# Patient Record
Sex: Male | Born: 1950 | Race: White | Hispanic: No | Marital: Single | Smoking: Current every day smoker
Health system: Southern US, Community
[De-identification: ages and names within clinical notes are randomized; demographics above are authoritative.]

## PROBLEM LIST (undated history)

## (undated) DIAGNOSIS — K219 Gastro-esophageal reflux disease without esophagitis: Secondary | ICD-10-CM

## (undated) DIAGNOSIS — E785 Hyperlipidemia, unspecified: Secondary | ICD-10-CM

## (undated) DIAGNOSIS — M1990 Unspecified osteoarthritis, unspecified site: Secondary | ICD-10-CM

## (undated) DIAGNOSIS — I10 Essential (primary) hypertension: Secondary | ICD-10-CM

## (undated) DIAGNOSIS — J449 Chronic obstructive pulmonary disease, unspecified: Secondary | ICD-10-CM

## (undated) HISTORY — DX: Chronic obstructive pulmonary disease, unspecified: J44.9

## (undated) HISTORY — DX: Hyperlipidemia, unspecified: E78.5

## (undated) HISTORY — DX: Unspecified osteoarthritis, unspecified site: M19.90

## (undated) HISTORY — DX: Gastro-esophageal reflux disease without esophagitis: K21.9

## (undated) HISTORY — DX: Essential (primary) hypertension: I10

---

## 1974-02-10 HISTORY — PX: KNEE SURGERY: SHX244

## 1998-07-30 ENCOUNTER — Encounter: Payer: Self-pay | Admitting: *Deleted

## 1998-07-30 ENCOUNTER — Inpatient Hospital Stay (HOSPITAL_COMMUNITY): Admission: AD | Admit: 1998-07-30 | Discharge: 1998-08-02 | Payer: Self-pay | Admitting: *Deleted

## 2003-06-19 ENCOUNTER — Emergency Department (HOSPITAL_COMMUNITY): Admission: EM | Admit: 2003-06-19 | Discharge: 2003-06-19 | Payer: Self-pay | Admitting: Emergency Medicine

## 2004-06-03 ENCOUNTER — Ambulatory Visit: Payer: Self-pay | Admitting: Internal Medicine

## 2004-06-06 ENCOUNTER — Ambulatory Visit (HOSPITAL_COMMUNITY): Admission: RE | Admit: 2004-06-06 | Discharge: 2004-06-06 | Payer: Self-pay | Admitting: Neurosurgery

## 2004-09-20 ENCOUNTER — Encounter: Admission: RE | Admit: 2004-09-20 | Discharge: 2004-09-20 | Payer: Self-pay | Admitting: Neurosurgery

## 2005-09-23 ENCOUNTER — Emergency Department: Payer: Self-pay | Admitting: Emergency Medicine

## 2006-05-15 IMAGING — CT CT CERVICAL SPINE W/O CM
3 of 13 series · 10 of 33 positions shown, 12 images · non-contrast
Comparison: None.

CLINICAL DATA: Neck and left arm pain.

CERVICAL MYELOGRAM
TECHNIQUE: Multidetector CT imaging of the cervical spine was performed
following the cervical myelogram.  Sagittal and coronal plane reformatted images
were reconstructed from the axial CT data, as well as axial reconstructed images
through the disc spaces, at the C2-C3 through through T2-T3 levels. These were
also reviewed.

[Series 102: cervical spine · axial · 0.23mm/px · z∈[-209,-148]mm · 2 of 593 slices shown, 3 images]
[im 198/593  soft-tissue]
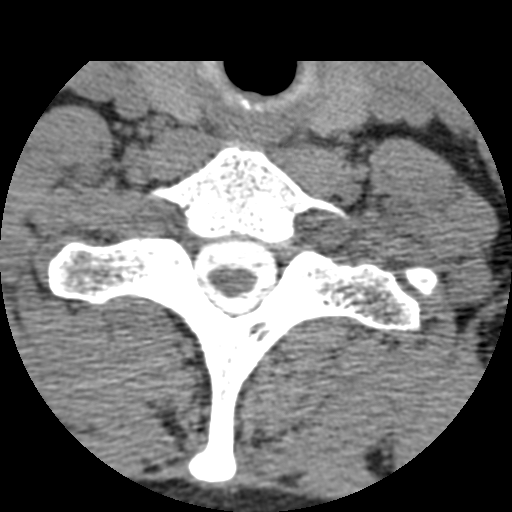
[im 198/593  bone]
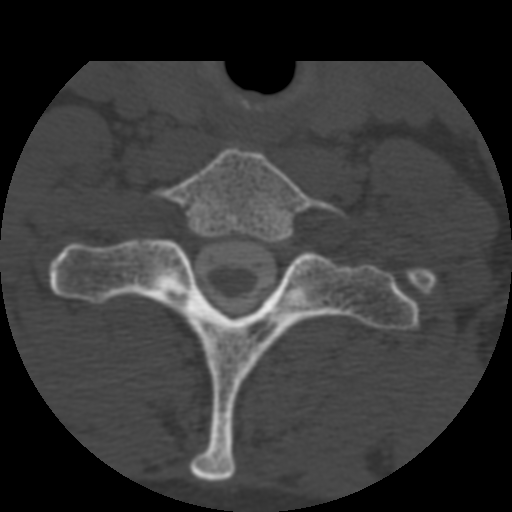
[im 395/593  bone]
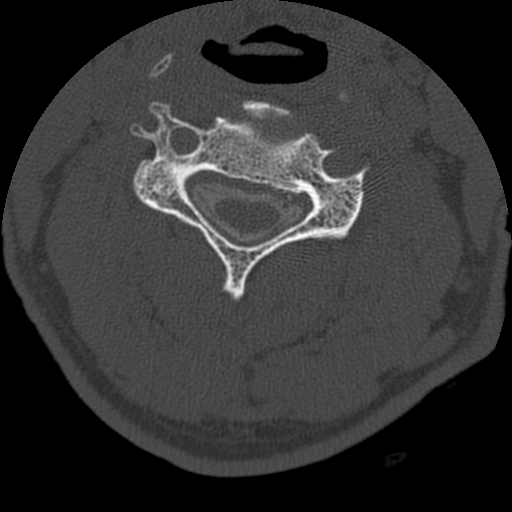

[Series 103: reformatted · sagittal · 0.37mm/px · 5 of 47 slices shown, 6 images (1 of 2)]
[im 16/47  bone]
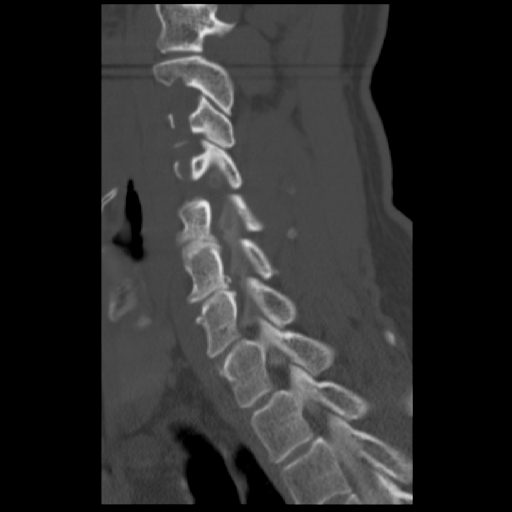
[im 20/47  bone]
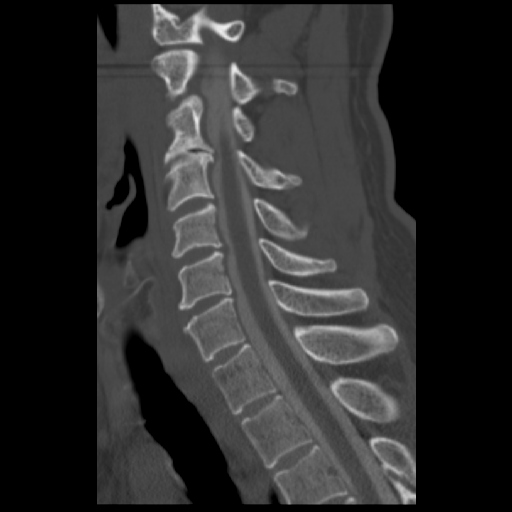
[im 24/47  soft-tissue]
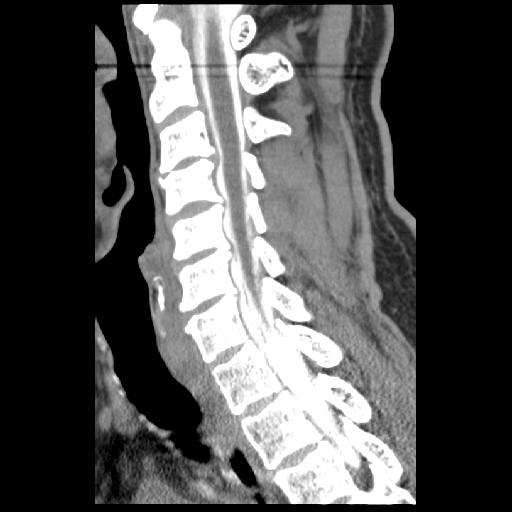
[im 24/47  bone]
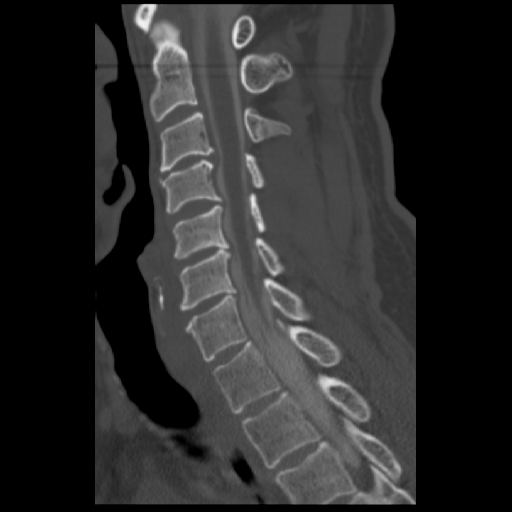
[im 27/47  bone]
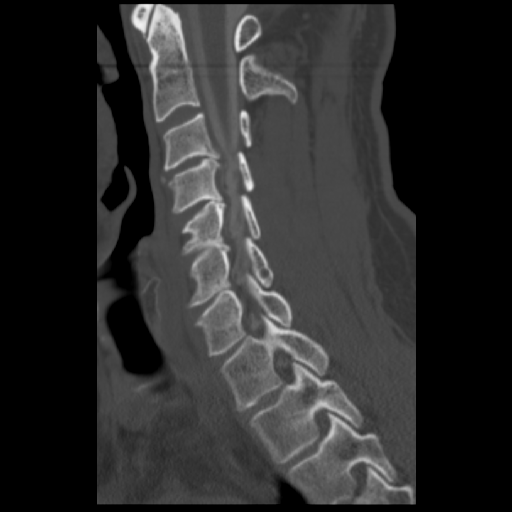
[im 31/47  bone]
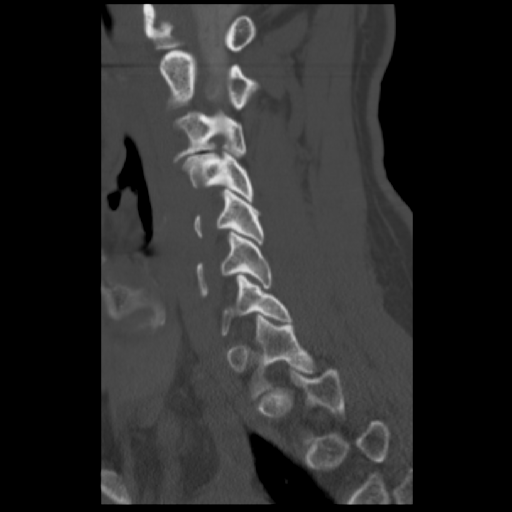

[Series 104: reformatted · coronal · 0.37mm/px · 3 of 40 slices shown (2 of 2)]
[im 8/40  bone]
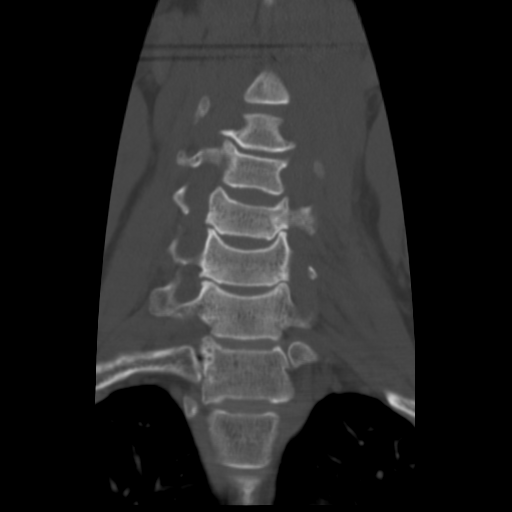
[im 16/40  bone]
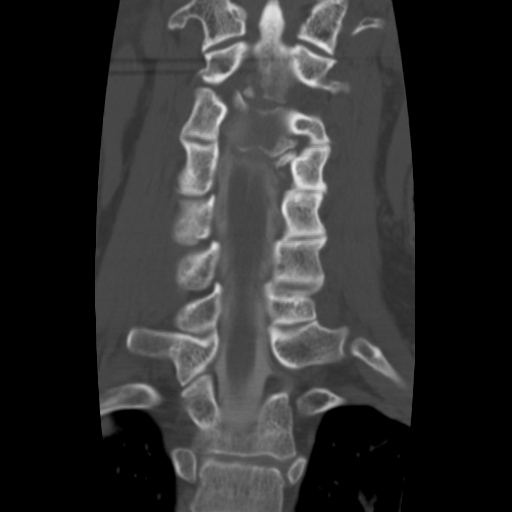
[im 24/40  bone]
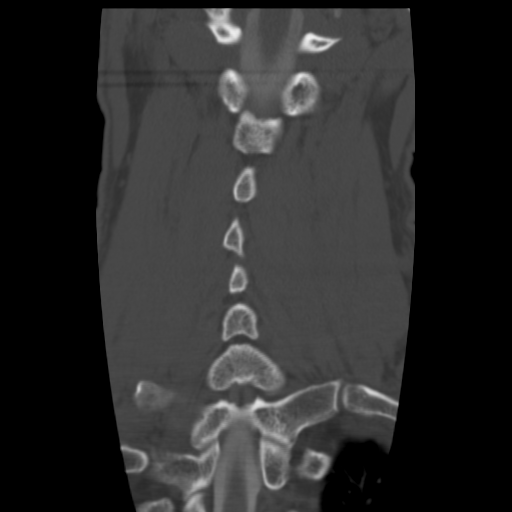

[10 of 33 positions shown; findings below may reference images not displayed]

FINDINGS: 10 cc of 2mnipaque-B88 was introduced into the subarachnoid space at
the L4-L5 level by Dr. Toka-Qeti. The contrast was then brought by gravity into
the cervical region and spot images of the cervical spine were obtained in
multiple projections. These demonstrate some decreased filling of the nerve root
sheath on the right at the C6-C7 level. The nerve root sheaths fill normally on
the left at each level. Posterior spurs are noted at the C3-C4 through C6-C7
levels, with associated anterior indentations on the thecal sac. No cord
abnormalities are visible.

IMPRESSION

1. Some decreased filling of the right C6-C7 nerve root sheath.

2. Normal filling of the nerve root sheaths on the left at each level. 

3. Posterior spondylosis at the C3-C4 through C6-C7 levels. This is most
pronounced at the C3-C4 level.

CERVICAL SPINE CT WITH CONTRAST
FINDINGS: Mild dextroconvex cervical scoliosis. This is most likely positional
since it was not seen on the myelogram. Normal appearing spinal cord.

C2-C3: Right uncinate spur formation without significant foraminal stenosis.

C3-C4: Moderate diffuse disc bulging and associated spur formation with more
pronounced spur formation in the uncinate region on the left. These changes are
producing mild canal stenosis, mild to moderate neural foraminal stenosis on the
right and moderate neural foraminal stenosis on the left. 

C4-C5: Mild-to-moderate diffuse disc bulging and associated spur formation with
minimal foraminal stenosis on the left.

C5-C6: Mild to moderate diffuse disc bulging and associated spur formation with
minimal foraminal stenosis on the right.

C6-C7: Mild diffuse disc bulging and associated spur formation with slightly
more pronounced spur formation in the uncinate regions bilaterally. This is
causing mild to moderate neural foraminal stenosis on the right.

C7-T1: Minimal posterior spur formation without significant canal or foraminal
stenosis. 

T1-T2: No significant abnormality.

T2-T3: No significant abnormality.

No focal disc herniations are seen.

IMPRESSION

Degenerative changes, as described above. No focal disc herniations are seen and
no neural compression is identified at any level.

CT MULTIPLANAR RECONSTRUCTION OF THE CERVICAL SPINE

Multiplanar reformatted CT images were reconstructed from the axial CT data. 
These images were reviewed, and pertinent findings are included in the complete
cervical spine CT report above. spine with

## 2006-05-15 IMAGING — RF DG MYELOGRAM CERVICAL
11 series · 11 of 11 positions shown · non-contrast
Comparison: None.

CLINICAL DATA: Neck and left arm pain.

CERVICAL MYELOGRAM
TECHNIQUE: Multidetector CT imaging of the cervical spine was performed
following the cervical myelogram.  Sagittal and coronal plane reformatted images
were reconstructed from the axial CT data, as well as axial reconstructed images
through the disc spaces, at the C2-C3 through through T2-T3 levels. These were
also reviewed.

[Series 1: run · 1 of 1 slices shown (1 of 11)]
[im 1/1]
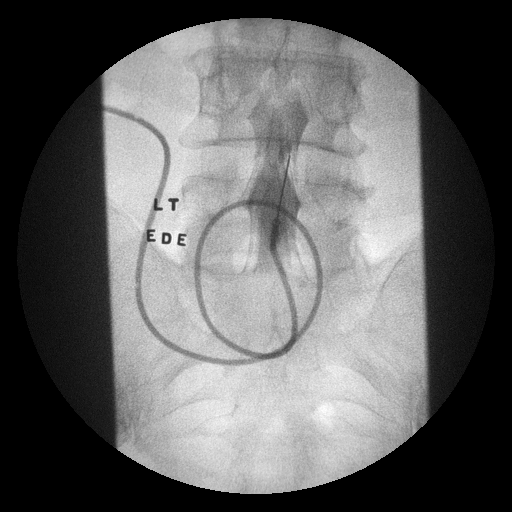

[Series 2: run · 1 of 1 slices shown (2 of 11)]
[im 1/1]
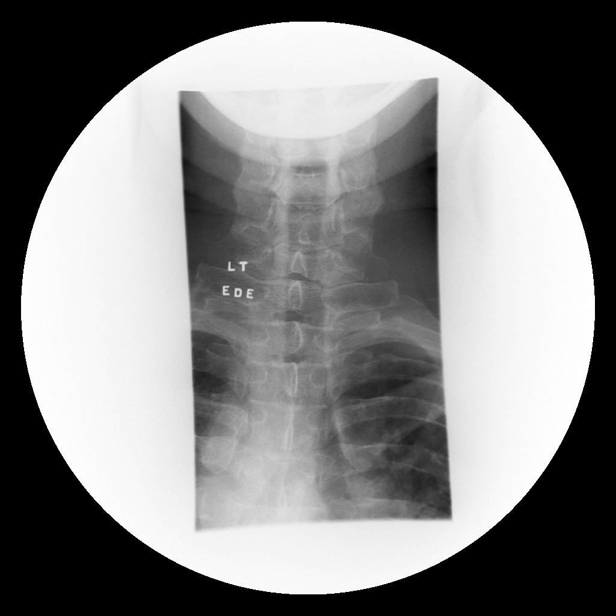

[Series 3: run · 1 of 1 slices shown (3 of 11)]
[im 1/1]
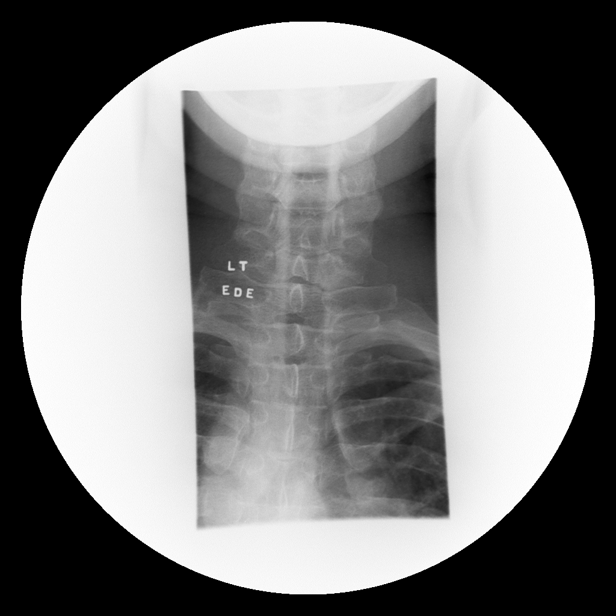

[Series 4: run · 1 of 1 slices shown (4 of 11)]
[im 1/1]
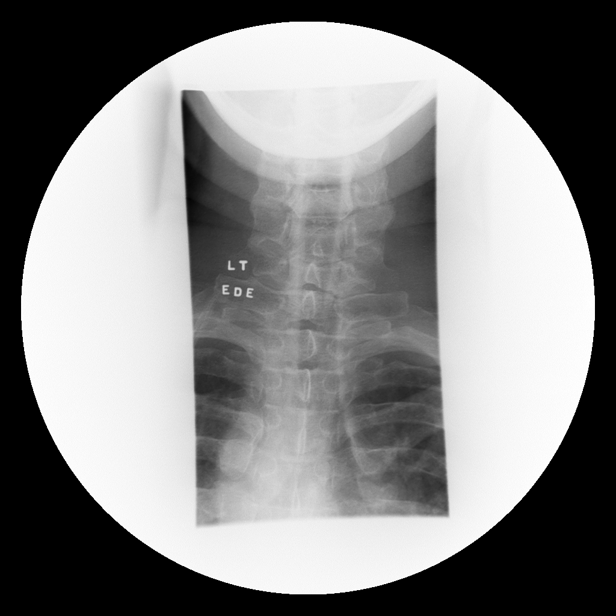

[Series 5: run · 1 of 1 slices shown (5 of 11)]
[im 1/1]
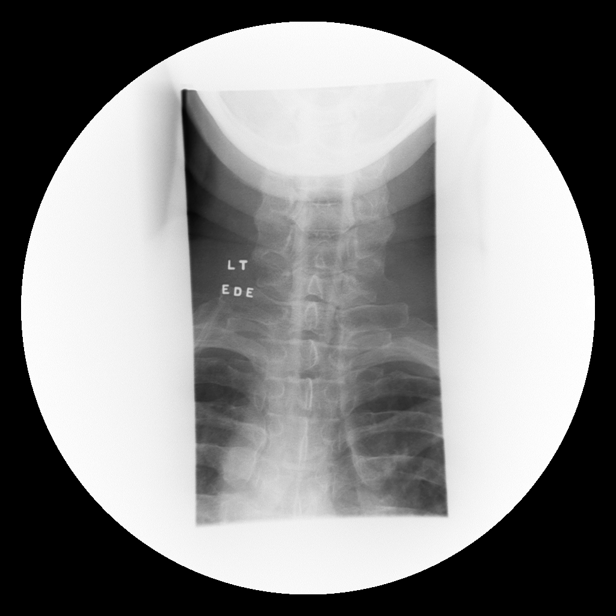

[Series 6: run · 1 of 1 slices shown (6 of 11)]
[im 1/1]
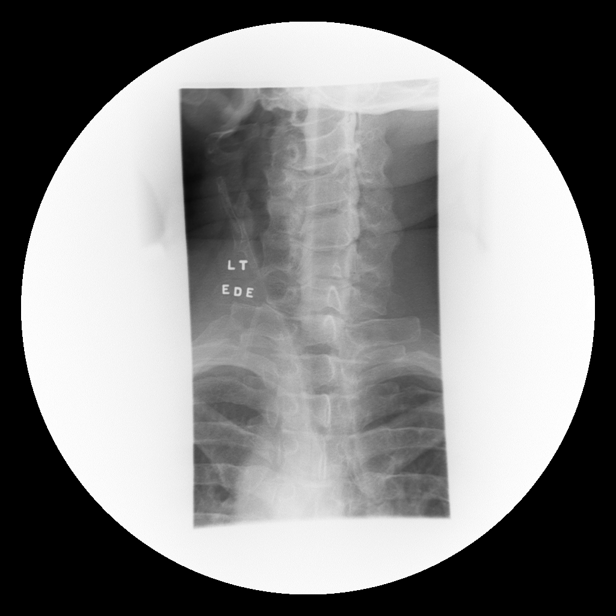

[Series 7: run · 1 of 1 slices shown (7 of 11)]
[im 1/1]
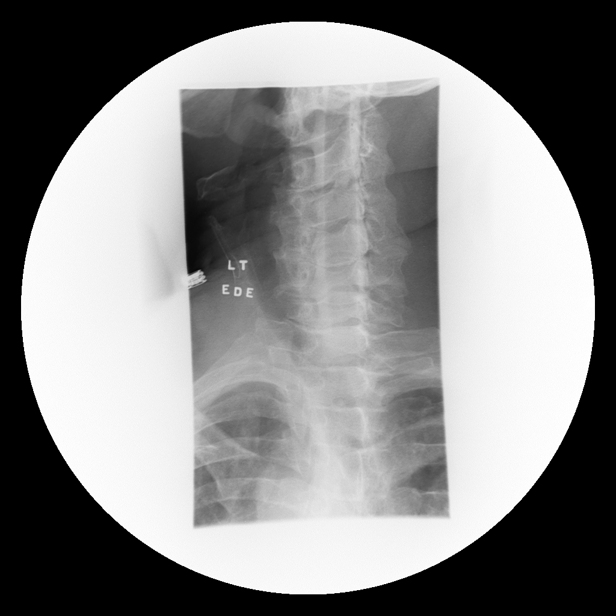

[Series 8: run · 1 of 1 slices shown (8 of 11)]
[im 1/1]
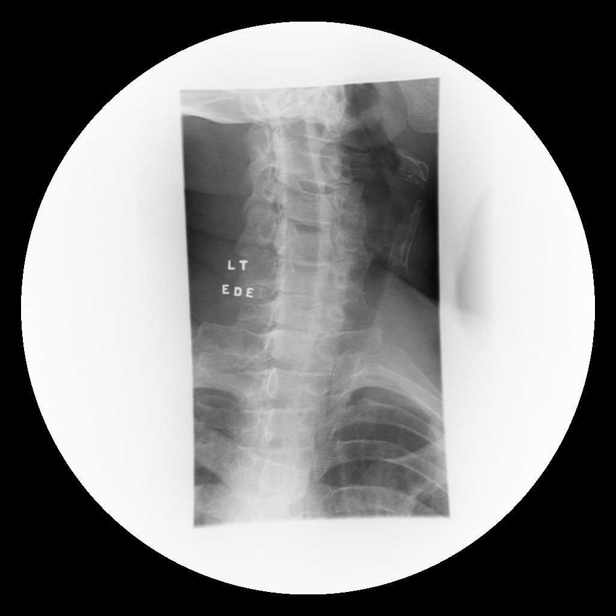

[Series 9: run · 1 of 1 slices shown (9 of 11)]
[im 1/1]
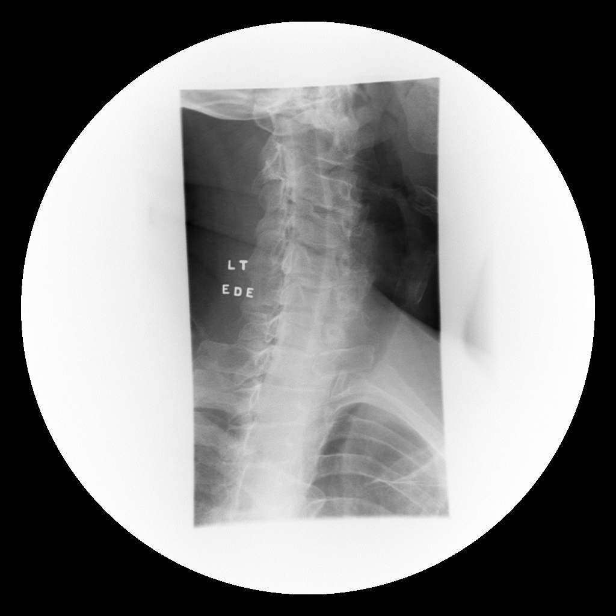

[Series 10: run · 1 of 1 slices shown (10 of 11)]
[im 1/1]
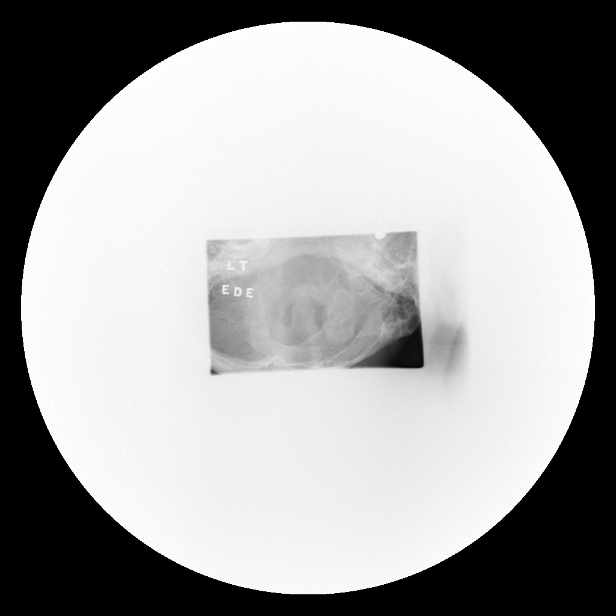

[Series 11: run · 1 of 1 slices shown (11 of 11)]
[im 1/1]
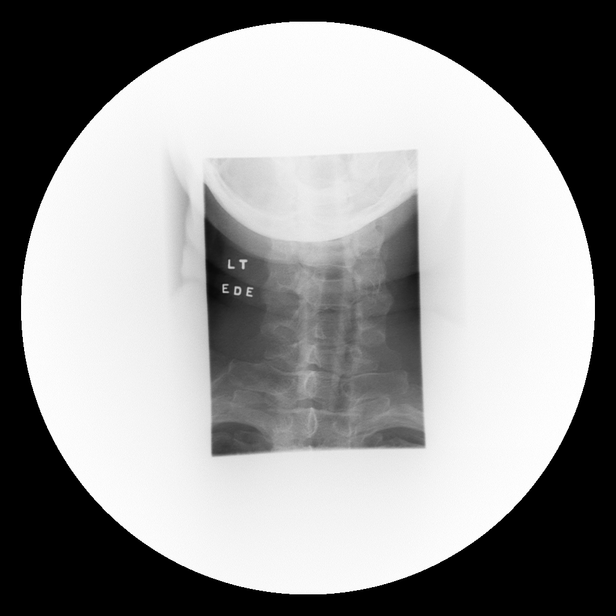

[11 of 11 positions shown; findings below may reference images not displayed]

FINDINGS: 10 cc of 2mnipaque-B88 was introduced into the subarachnoid space at
the L4-L5 level by Dr. Toka-Qeti. The contrast was then brought by gravity into
the cervical region and spot images of the cervical spine were obtained in
multiple projections. These demonstrate some decreased filling of the nerve root
sheath on the right at the C6-C7 level. The nerve root sheaths fill normally on
the left at each level. Posterior spurs are noted at the C3-C4 through C6-C7
levels, with associated anterior indentations on the thecal sac. No cord
abnormalities are visible.

IMPRESSION

1. Some decreased filling of the right C6-C7 nerve root sheath.

2. Normal filling of the nerve root sheaths on the left at each level. 

3. Posterior spondylosis at the C3-C4 through C6-C7 levels. This is most
pronounced at the C3-C4 level.

CERVICAL SPINE CT WITH CONTRAST
FINDINGS: Mild dextroconvex cervical scoliosis. This is most likely positional
since it was not seen on the myelogram. Normal appearing spinal cord.

C2-C3: Right uncinate spur formation without significant foraminal stenosis.

C3-C4: Moderate diffuse disc bulging and associated spur formation with more
pronounced spur formation in the uncinate region on the left. These changes are
producing mild canal stenosis, mild to moderate neural foraminal stenosis on the
right and moderate neural foraminal stenosis on the left. 

C4-C5: Mild-to-moderate diffuse disc bulging and associated spur formation with
minimal foraminal stenosis on the left.

C5-C6: Mild to moderate diffuse disc bulging and associated spur formation with
minimal foraminal stenosis on the right.

C6-C7: Mild diffuse disc bulging and associated spur formation with slightly
more pronounced spur formation in the uncinate regions bilaterally. This is
causing mild to moderate neural foraminal stenosis on the right.

C7-T1: Minimal posterior spur formation without significant canal or foraminal
stenosis. 

T1-T2: No significant abnormality.

T2-T3: No significant abnormality.

No focal disc herniations are seen.

IMPRESSION

Degenerative changes, as described above. No focal disc herniations are seen and
no neural compression is identified at any level.

CT MULTIPLANAR RECONSTRUCTION OF THE CERVICAL SPINE

Multiplanar reformatted CT images were reconstructed from the axial CT data. 
These images were reviewed, and pertinent findings are included in the complete
cervical spine CT report above. spine with

## 2006-08-17 ENCOUNTER — Emergency Department (HOSPITAL_COMMUNITY): Admission: EM | Admit: 2006-08-17 | Discharge: 2006-08-17 | Payer: Self-pay | Admitting: Emergency Medicine

## 2006-09-20 ENCOUNTER — Emergency Department (HOSPITAL_COMMUNITY): Admission: EM | Admit: 2006-09-20 | Discharge: 2006-09-20 | Payer: Self-pay | Admitting: Emergency Medicine

## 2010-05-16 ENCOUNTER — Ambulatory Visit: Payer: BC Managed Care – PPO | Attending: Unknown Physician Specialty

## 2010-05-16 DIAGNOSIS — M545 Low back pain, unspecified: Secondary | ICD-10-CM | POA: Insufficient documentation

## 2010-05-16 DIAGNOSIS — M6281 Muscle weakness (generalized): Secondary | ICD-10-CM | POA: Insufficient documentation

## 2010-05-16 DIAGNOSIS — R5381 Other malaise: Secondary | ICD-10-CM | POA: Insufficient documentation

## 2010-05-16 DIAGNOSIS — M542 Cervicalgia: Secondary | ICD-10-CM | POA: Insufficient documentation

## 2010-05-16 DIAGNOSIS — IMO0001 Reserved for inherently not codable concepts without codable children: Secondary | ICD-10-CM | POA: Insufficient documentation

## 2010-05-20 ENCOUNTER — Ambulatory Visit: Payer: BC Managed Care – PPO | Admitting: Physical Therapy

## 2010-05-22 ENCOUNTER — Ambulatory Visit: Payer: BC Managed Care – PPO | Admitting: Physical Therapy

## 2010-05-27 ENCOUNTER — Ambulatory Visit: Payer: BC Managed Care – PPO | Admitting: Physical Therapy

## 2010-05-28 ENCOUNTER — Ambulatory Visit: Payer: BC Managed Care – PPO

## 2010-06-05 ENCOUNTER — Ambulatory Visit: Payer: BC Managed Care – PPO | Admitting: Physical Therapy

## 2010-06-06 ENCOUNTER — Ambulatory Visit: Payer: BC Managed Care – PPO

## 2010-06-10 ENCOUNTER — Ambulatory Visit: Payer: BC Managed Care – PPO

## 2010-06-12 ENCOUNTER — Ambulatory Visit: Payer: BC Managed Care – PPO | Attending: Unknown Physician Specialty | Admitting: Physical Therapy

## 2010-06-12 DIAGNOSIS — IMO0001 Reserved for inherently not codable concepts without codable children: Secondary | ICD-10-CM | POA: Insufficient documentation

## 2010-06-12 DIAGNOSIS — M542 Cervicalgia: Secondary | ICD-10-CM | POA: Insufficient documentation

## 2010-06-12 DIAGNOSIS — M545 Low back pain, unspecified: Secondary | ICD-10-CM | POA: Insufficient documentation

## 2010-06-12 DIAGNOSIS — R5381 Other malaise: Secondary | ICD-10-CM | POA: Insufficient documentation

## 2010-06-12 DIAGNOSIS — M6281 Muscle weakness (generalized): Secondary | ICD-10-CM | POA: Insufficient documentation

## 2010-06-17 ENCOUNTER — Ambulatory Visit: Payer: BC Managed Care – PPO

## 2010-06-20 ENCOUNTER — Ambulatory Visit: Payer: BC Managed Care – PPO

## 2010-06-25 ENCOUNTER — Ambulatory Visit: Payer: BC Managed Care – PPO

## 2010-06-27 ENCOUNTER — Ambulatory Visit: Payer: BC Managed Care – PPO

## 2012-01-27 DIAGNOSIS — M5030 Other cervical disc degeneration, unspecified cervical region: Secondary | ICD-10-CM | POA: Insufficient documentation

## 2012-01-27 DIAGNOSIS — J439 Emphysema, unspecified: Secondary | ICD-10-CM | POA: Insufficient documentation

## 2012-02-20 DIAGNOSIS — Z79891 Long term (current) use of opiate analgesic: Secondary | ICD-10-CM | POA: Insufficient documentation

## 2012-02-20 DIAGNOSIS — G894 Chronic pain syndrome: Secondary | ICD-10-CM | POA: Insufficient documentation

## 2012-02-20 DIAGNOSIS — F17200 Nicotine dependence, unspecified, uncomplicated: Secondary | ICD-10-CM | POA: Insufficient documentation

## 2012-06-29 ENCOUNTER — Other Ambulatory Visit: Payer: Self-pay | Admitting: Orthopedic Surgery

## 2012-06-29 DIAGNOSIS — M25512 Pain in left shoulder: Secondary | ICD-10-CM

## 2012-07-01 ENCOUNTER — Ambulatory Visit
Admission: RE | Admit: 2012-07-01 | Discharge: 2012-07-01 | Disposition: A | Discharge status: Home / Self Care | Payer: BC Managed Care – PPO | Source: Ambulatory Visit | Attending: Orthopedic Surgery | Admitting: Orthopedic Surgery

## 2012-07-01 DIAGNOSIS — M25512 Pain in left shoulder: Secondary | ICD-10-CM

## 2013-02-10 HISTORY — PX: CERVICAL DISC SURGERY: SHX588

## 2015-05-29 DIAGNOSIS — E669 Obesity, unspecified: Secondary | ICD-10-CM | POA: Diagnosis not present

## 2015-05-29 DIAGNOSIS — I10 Essential (primary) hypertension: Secondary | ICD-10-CM | POA: Diagnosis not present

## 2015-05-29 DIAGNOSIS — J449 Chronic obstructive pulmonary disease, unspecified: Secondary | ICD-10-CM | POA: Diagnosis not present

## 2015-05-29 DIAGNOSIS — Z79899 Other long term (current) drug therapy: Secondary | ICD-10-CM | POA: Diagnosis not present

## 2015-05-29 DIAGNOSIS — E559 Vitamin D deficiency, unspecified: Secondary | ICD-10-CM | POA: Diagnosis not present

## 2015-05-29 DIAGNOSIS — F419 Anxiety disorder, unspecified: Secondary | ICD-10-CM | POA: Diagnosis not present

## 2015-05-29 DIAGNOSIS — Z716 Tobacco abuse counseling: Secondary | ICD-10-CM | POA: Diagnosis not present

## 2015-05-29 DIAGNOSIS — R7989 Other specified abnormal findings of blood chemistry: Secondary | ICD-10-CM | POA: Diagnosis not present

## 2015-05-29 DIAGNOSIS — E785 Hyperlipidemia, unspecified: Secondary | ICD-10-CM | POA: Diagnosis not present

## 2015-05-29 DIAGNOSIS — E7800 Pure hypercholesterolemia, unspecified: Secondary | ICD-10-CM | POA: Diagnosis not present

## 2015-05-30 DIAGNOSIS — Z79891 Long term (current) use of opiate analgesic: Secondary | ICD-10-CM | POA: Diagnosis not present

## 2015-07-26 DIAGNOSIS — I10 Essential (primary) hypertension: Secondary | ICD-10-CM | POA: Diagnosis not present

## 2015-07-26 DIAGNOSIS — E782 Mixed hyperlipidemia: Secondary | ICD-10-CM | POA: Diagnosis not present

## 2015-07-26 DIAGNOSIS — J410 Simple chronic bronchitis: Secondary | ICD-10-CM | POA: Diagnosis not present

## 2015-07-26 DIAGNOSIS — S70262A Insect bite (nonvenomous), left hip, initial encounter: Secondary | ICD-10-CM | POA: Diagnosis not present

## 2015-07-26 DIAGNOSIS — R202 Paresthesia of skin: Secondary | ICD-10-CM | POA: Diagnosis not present

## 2015-07-26 DIAGNOSIS — R5383 Other fatigue: Secondary | ICD-10-CM | POA: Diagnosis not present

## 2015-08-27 DIAGNOSIS — M25512 Pain in left shoulder: Secondary | ICD-10-CM | POA: Diagnosis not present

## 2015-08-27 DIAGNOSIS — E782 Mixed hyperlipidemia: Secondary | ICD-10-CM | POA: Diagnosis not present

## 2015-08-27 DIAGNOSIS — F528 Other sexual dysfunction not due to a substance or known physiological condition: Secondary | ICD-10-CM | POA: Diagnosis not present

## 2015-09-05 DIAGNOSIS — M25512 Pain in left shoulder: Secondary | ICD-10-CM | POA: Diagnosis not present

## 2015-09-12 DIAGNOSIS — M25512 Pain in left shoulder: Secondary | ICD-10-CM | POA: Diagnosis not present

## 2015-10-29 DIAGNOSIS — Z6824 Body mass index (BMI) 24.0-24.9, adult: Secondary | ICD-10-CM | POA: Diagnosis not present

## 2015-10-29 DIAGNOSIS — I10 Essential (primary) hypertension: Secondary | ICD-10-CM | POA: Diagnosis not present

## 2015-10-29 DIAGNOSIS — Z125 Encounter for screening for malignant neoplasm of prostate: Secondary | ICD-10-CM | POA: Diagnosis not present

## 2015-10-29 DIAGNOSIS — Z0000 Encounter for general adult medical examination without abnormal findings: Secondary | ICD-10-CM | POA: Diagnosis not present

## 2015-11-28 DIAGNOSIS — E782 Mixed hyperlipidemia: Secondary | ICD-10-CM | POA: Diagnosis not present

## 2015-11-28 DIAGNOSIS — I10 Essential (primary) hypertension: Secondary | ICD-10-CM | POA: Diagnosis not present

## 2016-03-06 DIAGNOSIS — I10 Essential (primary) hypertension: Secondary | ICD-10-CM | POA: Diagnosis not present

## 2016-03-06 DIAGNOSIS — E782 Mixed hyperlipidemia: Secondary | ICD-10-CM | POA: Diagnosis not present

## 2016-03-21 DIAGNOSIS — H6123 Impacted cerumen, bilateral: Secondary | ICD-10-CM | POA: Diagnosis not present

## 2016-03-21 DIAGNOSIS — H60313 Diffuse otitis externa, bilateral: Secondary | ICD-10-CM | POA: Diagnosis not present

## 2016-03-21 DIAGNOSIS — J018 Other acute sinusitis: Secondary | ICD-10-CM | POA: Diagnosis not present

## 2016-04-16 DIAGNOSIS — H6123 Impacted cerumen, bilateral: Secondary | ICD-10-CM | POA: Diagnosis not present

## 2016-04-16 DIAGNOSIS — T162XXA Foreign body in left ear, initial encounter: Secondary | ICD-10-CM | POA: Diagnosis not present

## 2016-04-16 DIAGNOSIS — H9312 Tinnitus, left ear: Secondary | ICD-10-CM | POA: Diagnosis not present

## 2016-06-09 DIAGNOSIS — Z23 Encounter for immunization: Secondary | ICD-10-CM | POA: Diagnosis not present

## 2016-06-09 DIAGNOSIS — I10 Essential (primary) hypertension: Secondary | ICD-10-CM | POA: Diagnosis not present

## 2016-06-09 DIAGNOSIS — E782 Mixed hyperlipidemia: Secondary | ICD-10-CM | POA: Diagnosis not present

## 2016-06-09 DIAGNOSIS — R7301 Impaired fasting glucose: Secondary | ICD-10-CM | POA: Diagnosis not present

## 2016-06-09 DIAGNOSIS — Z1159 Encounter for screening for other viral diseases: Secondary | ICD-10-CM | POA: Diagnosis not present

## 2016-07-21 DIAGNOSIS — Z1211 Encounter for screening for malignant neoplasm of colon: Secondary | ICD-10-CM | POA: Diagnosis not present

## 2016-09-09 DIAGNOSIS — E782 Mixed hyperlipidemia: Secondary | ICD-10-CM | POA: Diagnosis not present

## 2016-09-09 DIAGNOSIS — I10 Essential (primary) hypertension: Secondary | ICD-10-CM | POA: Diagnosis not present

## 2016-09-09 DIAGNOSIS — R7301 Impaired fasting glucose: Secondary | ICD-10-CM | POA: Diagnosis not present

## 2016-11-18 DIAGNOSIS — F410 Panic disorder [episodic paroxysmal anxiety] without agoraphobia: Secondary | ICD-10-CM | POA: Insufficient documentation

## 2016-12-12 DIAGNOSIS — R7302 Impaired glucose tolerance (oral): Secondary | ICD-10-CM | POA: Diagnosis not present

## 2016-12-12 DIAGNOSIS — I10 Essential (primary) hypertension: Secondary | ICD-10-CM | POA: Diagnosis not present

## 2016-12-12 DIAGNOSIS — E782 Mixed hyperlipidemia: Secondary | ICD-10-CM | POA: Diagnosis not present

## 2016-12-30 DIAGNOSIS — Z0000 Encounter for general adult medical examination without abnormal findings: Secondary | ICD-10-CM | POA: Diagnosis not present

## 2016-12-30 DIAGNOSIS — K5903 Drug induced constipation: Secondary | ICD-10-CM | POA: Diagnosis not present

## 2017-03-19 DIAGNOSIS — E782 Mixed hyperlipidemia: Secondary | ICD-10-CM | POA: Diagnosis not present

## 2017-03-19 DIAGNOSIS — R7302 Impaired glucose tolerance (oral): Secondary | ICD-10-CM | POA: Diagnosis not present

## 2017-03-19 DIAGNOSIS — I10 Essential (primary) hypertension: Secondary | ICD-10-CM | POA: Diagnosis not present

## 2017-03-30 DIAGNOSIS — R1319 Other dysphagia: Secondary | ICD-10-CM | POA: Diagnosis not present

## 2017-03-30 DIAGNOSIS — R799 Abnormal finding of blood chemistry, unspecified: Secondary | ICD-10-CM | POA: Diagnosis not present

## 2017-04-01 DIAGNOSIS — J439 Emphysema, unspecified: Secondary | ICD-10-CM | POA: Diagnosis not present

## 2017-04-01 DIAGNOSIS — Z122 Encounter for screening for malignant neoplasm of respiratory organs: Secondary | ICD-10-CM | POA: Diagnosis not present

## 2017-04-01 DIAGNOSIS — Z87891 Personal history of nicotine dependence: Secondary | ICD-10-CM | POA: Diagnosis not present

## 2017-04-01 DIAGNOSIS — I700 Atherosclerosis of aorta: Secondary | ICD-10-CM | POA: Diagnosis not present

## 2017-04-01 DIAGNOSIS — I2510 Atherosclerotic heart disease of native coronary artery without angina pectoris: Secondary | ICD-10-CM | POA: Diagnosis not present

## 2017-06-18 DIAGNOSIS — H52223 Regular astigmatism, bilateral: Secondary | ICD-10-CM | POA: Diagnosis not present

## 2017-06-18 DIAGNOSIS — I10 Essential (primary) hypertension: Secondary | ICD-10-CM | POA: Diagnosis not present

## 2017-06-18 DIAGNOSIS — H2513 Age-related nuclear cataract, bilateral: Secondary | ICD-10-CM | POA: Diagnosis not present

## 2017-06-18 DIAGNOSIS — E782 Mixed hyperlipidemia: Secondary | ICD-10-CM | POA: Diagnosis not present

## 2017-06-18 DIAGNOSIS — J410 Simple chronic bronchitis: Secondary | ICD-10-CM | POA: Diagnosis not present

## 2017-06-18 DIAGNOSIS — H5203 Hypermetropia, bilateral: Secondary | ICD-10-CM | POA: Diagnosis not present

## 2017-06-18 DIAGNOSIS — H524 Presbyopia: Secondary | ICD-10-CM | POA: Diagnosis not present

## 2017-06-18 DIAGNOSIS — R7302 Impaired glucose tolerance (oral): Secondary | ICD-10-CM | POA: Diagnosis not present

## 2017-09-29 DIAGNOSIS — E782 Mixed hyperlipidemia: Secondary | ICD-10-CM | POA: Diagnosis not present

## 2017-09-29 DIAGNOSIS — R7302 Impaired glucose tolerance (oral): Secondary | ICD-10-CM | POA: Diagnosis not present

## 2017-09-29 DIAGNOSIS — M5137 Other intervertebral disc degeneration, lumbosacral region: Secondary | ICD-10-CM | POA: Diagnosis not present

## 2017-09-29 DIAGNOSIS — I10 Essential (primary) hypertension: Secondary | ICD-10-CM | POA: Diagnosis not present

## 2017-09-29 DIAGNOSIS — J410 Simple chronic bronchitis: Secondary | ICD-10-CM | POA: Diagnosis not present

## 2017-09-29 DIAGNOSIS — M5116 Intervertebral disc disorders with radiculopathy, lumbar region: Secondary | ICD-10-CM | POA: Diagnosis not present

## 2017-09-29 DIAGNOSIS — M545 Low back pain: Secondary | ICD-10-CM | POA: Diagnosis not present

## 2017-12-31 DIAGNOSIS — I10 Essential (primary) hypertension: Secondary | ICD-10-CM | POA: Diagnosis not present

## 2017-12-31 DIAGNOSIS — R7303 Prediabetes: Secondary | ICD-10-CM | POA: Diagnosis not present

## 2017-12-31 DIAGNOSIS — K219 Gastro-esophageal reflux disease without esophagitis: Secondary | ICD-10-CM | POA: Diagnosis not present

## 2017-12-31 DIAGNOSIS — Z0000 Encounter for general adult medical examination without abnormal findings: Secondary | ICD-10-CM | POA: Diagnosis not present

## 2017-12-31 DIAGNOSIS — J449 Chronic obstructive pulmonary disease, unspecified: Secondary | ICD-10-CM | POA: Diagnosis not present

## 2018-04-08 DIAGNOSIS — E782 Mixed hyperlipidemia: Secondary | ICD-10-CM | POA: Diagnosis not present

## 2018-04-08 DIAGNOSIS — J410 Simple chronic bronchitis: Secondary | ICD-10-CM | POA: Diagnosis not present

## 2018-04-08 DIAGNOSIS — R7302 Impaired glucose tolerance (oral): Secondary | ICD-10-CM | POA: Diagnosis not present

## 2018-04-08 DIAGNOSIS — I10 Essential (primary) hypertension: Secondary | ICD-10-CM | POA: Diagnosis not present

## 2018-07-08 DIAGNOSIS — I10 Essential (primary) hypertension: Secondary | ICD-10-CM | POA: Diagnosis not present

## 2018-07-08 DIAGNOSIS — R7302 Impaired glucose tolerance (oral): Secondary | ICD-10-CM | POA: Diagnosis not present

## 2018-07-08 DIAGNOSIS — J410 Simple chronic bronchitis: Secondary | ICD-10-CM | POA: Diagnosis not present

## 2018-07-08 DIAGNOSIS — E782 Mixed hyperlipidemia: Secondary | ICD-10-CM | POA: Diagnosis not present

## 2018-10-12 DIAGNOSIS — J410 Simple chronic bronchitis: Secondary | ICD-10-CM | POA: Diagnosis not present

## 2018-10-12 DIAGNOSIS — I10 Essential (primary) hypertension: Secondary | ICD-10-CM | POA: Diagnosis not present

## 2018-10-12 DIAGNOSIS — E782 Mixed hyperlipidemia: Secondary | ICD-10-CM | POA: Diagnosis not present

## 2018-10-12 DIAGNOSIS — R7302 Impaired glucose tolerance (oral): Secondary | ICD-10-CM | POA: Diagnosis not present

## 2018-11-11 DIAGNOSIS — K5904 Chronic idiopathic constipation: Secondary | ICD-10-CM | POA: Diagnosis not present

## 2018-11-11 DIAGNOSIS — R12 Heartburn: Secondary | ICD-10-CM | POA: Diagnosis not present

## 2018-11-12 DIAGNOSIS — K200 Eosinophilic esophagitis: Secondary | ICD-10-CM | POA: Diagnosis not present

## 2018-11-12 DIAGNOSIS — K219 Gastro-esophageal reflux disease without esophagitis: Secondary | ICD-10-CM | POA: Diagnosis not present

## 2018-11-12 DIAGNOSIS — R12 Heartburn: Secondary | ICD-10-CM | POA: Diagnosis not present

## 2018-12-31 DIAGNOSIS — Z0000 Encounter for general adult medical examination without abnormal findings: Secondary | ICD-10-CM | POA: Diagnosis not present

## 2019-01-12 DIAGNOSIS — E782 Mixed hyperlipidemia: Secondary | ICD-10-CM | POA: Diagnosis not present

## 2019-01-12 DIAGNOSIS — I10 Essential (primary) hypertension: Secondary | ICD-10-CM | POA: Diagnosis not present

## 2019-04-04 ENCOUNTER — Encounter: Payer: Self-pay | Admitting: Family Medicine

## 2019-04-04 ENCOUNTER — Other Ambulatory Visit: Payer: Self-pay

## 2019-04-04 ENCOUNTER — Ambulatory Visit (INDEPENDENT_AMBULATORY_CARE_PROVIDER_SITE_OTHER): Payer: Medicare Other | Admitting: Family Medicine

## 2019-04-04 VITALS — BP 140/72 | HR 104 | Temp 98.0°F | Resp 16 | Ht 68.0 in | Wt 176.4 lb

## 2019-04-04 DIAGNOSIS — E782 Mixed hyperlipidemia: Secondary | ICD-10-CM | POA: Diagnosis not present

## 2019-04-04 DIAGNOSIS — I10 Essential (primary) hypertension: Secondary | ICD-10-CM | POA: Insufficient documentation

## 2019-04-04 DIAGNOSIS — F17219 Nicotine dependence, cigarettes, with unspecified nicotine-induced disorders: Secondary | ICD-10-CM

## 2019-04-04 DIAGNOSIS — R7301 Impaired fasting glucose: Secondary | ICD-10-CM | POA: Diagnosis not present

## 2019-04-04 DIAGNOSIS — K219 Gastro-esophageal reflux disease without esophagitis: Secondary | ICD-10-CM | POA: Diagnosis not present

## 2019-04-04 DIAGNOSIS — Z125 Encounter for screening for malignant neoplasm of prostate: Secondary | ICD-10-CM

## 2019-04-04 DIAGNOSIS — M5412 Radiculopathy, cervical region: Secondary | ICD-10-CM | POA: Insufficient documentation

## 2019-04-04 MED ORDER — AMLODIPINE BESYLATE 10 MG PO TABS: 10.0000 mg | ORAL_TABLET | Freq: Every day | ORAL | 1 refills | Status: DC

## 2019-04-04 MED ORDER — PANTOPRAZOLE SODIUM 40 MG PO TBEC: 40.0000 mg | DELAYED_RELEASE_TABLET | Freq: Two times a day (BID) | ORAL | 1 refills | Status: DC

## 2019-04-04 MED ORDER — ROSUVASTATIN CALCIUM 20 MG PO TABS: 20.0000 mg | ORAL_TABLET | Freq: Every day | ORAL | 1 refills | Status: DC

## 2019-04-04 NOTE — Patient Instructions (Signed)
Recommend continue all current medications Consider quit smoking.  Recommend start exercising.

## 2019-04-04 NOTE — Progress Notes (Signed)
Subjective:  Patient ID: Gregory Delgado, male    DOB: [birth year]  Age: 69 y.o. MRN: [id number]  Chief Complaint  Patient presents with  . Hypertension  . Hyperlipidemia    Hypertension This is a chronic problem. The current episode started more than 1 year ago. The problem is controlled. Pertinent negatives include no chest pain, headaches or shortness of breath. Risk factors for coronary artery disease include dyslipidemia. There are no compliance problems (Not exercising due to cold weather. ).   Hyperlipidemia This is a chronic problem. The current episode started more than 1 year ago. The problem is controlled. Pertinent negatives include no chest pain, myalgias or shortness of breath. Current antihyperlipidemic treatment includes statins and diet change (rosuvastatin 20 mg once at night.). Compliance problems include adherence to exercise.  Risk factors for coronary artery disease include hypertension.      Social History   Socioeconomic History  . Marital status: Single    Spouse name: Not on file  . Number of children: Not on file  . Years of education: Not on file  . Highest education level: Not on file  Occupational History  . Not on file  Tobacco Use  . Smoking status: Current Every Day Smoker  . Smokeless tobacco: Never Used  Substance and Sexual Activity  . Alcohol use: Never  . Drug use: Never  . Sexual activity: Not on file  Other Topics Concern  . Not on file  Social History Narrative  . Not on file   Social Determinants of Health   Financial Resource Strain:   . Difficulty of Paying Living Expenses: Not on file  Food Insecurity:   . Worried About Programme researcher, broadcasting/film/video in the Last Year: Not on file  . Ran Out of Food in the Last Year: Not on file  Transportation Needs:   . Lack of Transportation (Medical): Not on file  . Lack of Transportation (Non-Medical): Not on file  Physical Activity:   . Days of Exercise per Week: Not on file  . Minutes of  Exercise per Session: Not on file  Stress:   . Feeling of Stress : Not on file  Social Connections:   . Frequency of Communication with Friends and Family: Not on file  . Frequency of Social Gatherings with Friends and Family: Not on file  . Attends Religious Services: Not on file  . Active Member of Clubs or Organizations: Not on file  . Attends Banker Meetings: Not on file  . Marital Status: Not on file   Past Medical History:  Diagnosis Date  . COPD (chronic obstructive pulmonary disease) (HCC)   . GERD (gastroesophageal reflux disease)   . Hyperlipidemia   . Hypertension   . Osteoarthritis    Family History  Problem Relation Age of Onset  . Diabetes Mother   . Alcoholism Father   . CVA Sister   . Congenital heart disease Daughter   . Rectal cancer Son   . Cirrhosis Son     Review of Systems  Constitutional: Negative for chills, fatigue and fever.  HENT: Negative for congestion, ear pain and sore throat.   Eyes: Negative for visual disturbance.  Respiratory: Negative for cough and shortness of breath.   Cardiovascular: Negative for chest pain and leg swelling.  Gastrointestinal: Negative for abdominal pain, constipation, diarrhea, nausea and vomiting.       GERD does well on pantoprazole.  Endocrine: Negative for polydipsia, polyphagia and polyuria.  Genitourinary: Negative for  dysuria and frequency.  Musculoskeletal: Positive for arthralgias. Negative for myalgias.       BL shoulder pain. Has spasms in legs occasionally. Takes tramadol 2 pills twice a day.   Neurological: Negative for dizziness and headaches.  Psychiatric/Behavioral: Negative for dysphoric mood.       No dysphoria     Objective:  BP 140/72 (BP Location: Left Arm, Patient Position: Sitting, Cuff Size: Normal)   Pulse (!) 104   Temp 98 F (36.7 C)   Resp 16   Ht 5\' 8"  (1.727 m)   Wt 176 lb 6.4 oz (80 kg)   BMI 26.82 kg/m   BP/Weight 6/94/8546  Systolic BP 270  Diastolic BP  72  Wt. (Lbs) 176.4  BMI 26.82    Physical Exam Vitals reviewed.  Constitutional:      Appearance: Normal appearance.  Neck:     Vascular: No carotid bruit.  Cardiovascular:     Rate and Rhythm: Normal rate and regular rhythm.     Pulses: Normal pulses.     Heart sounds: Normal heart sounds.  Pulmonary:     Effort: Pulmonary effort is normal.     Breath sounds: Normal breath sounds. No wheezing, rhonchi or rales.  Abdominal:     General: Bowel sounds are normal.     Palpations: Abdomen is soft.     Tenderness: There is no abdominal tenderness.  Neurological:     Mental Status: He is alert.  Psychiatric:        Mood and Affect: Mood normal.        Behavior: Behavior normal.     Lab Results  Component Value Date   WBC CANCELED 04/05/2019   HGB CANCELED 04/05/2019   HCT CANCELED 04/05/2019   PLT CANCELED 04/05/2019   GLUCOSE 92 04/05/2019   CHOL 102 04/05/2019   TRIG 109 04/05/2019   HDL 33 (L) 04/05/2019   LDLCALC 49 04/05/2019   AST 15 04/05/2019   NA 142 04/05/2019   K 3.6 04/05/2019   CL 102 04/05/2019   CREATININE 1.26 04/05/2019   BUN 11 04/05/2019   TSH 3.730 04/05/2019   HGBA1C 5.9 (H) 04/05/2019      Assessment & Plan:    Meds ordered this encounter  Medications  . amLODipine (NORVASC) 10 MG tablet    Sig: Take 1 tablet (10 mg total) by mouth daily.    Dispense:  90 tablet    Refill:  1  . pantoprazole (PROTONIX) 40 MG tablet    Sig: Take 1 tablet (40 mg total) by mouth 2 (two) times daily.    Dispense:  180 tablet    Refill:  1  . rosuvastatin (CRESTOR) 20 MG tablet    Sig: Take 1 tablet (20 mg total) by mouth daily.    Dispense:  90 tablet    Refill:  1  Essential hypertension, benign Fairly well controlled.  Continue current medications.  Labs done.  GERD without esophagitis Well-controlled.  Continue current medications.  Cigarette nicotine dependence with nicotine-induced disorder Strongly recommend tobacco cessation.  Patient  currently not interested.  Mixed hyperlipidemia Continue current medications.  Fairly well controlled.  Recommend low-fat diet and exercise.  Screening for prostate cancer Check PSA.  Impaired fasting glucose Recommend low sugar diet.  Check A1c today.      Follow-up: Return in about 3 months (around 07/02/2019) for fasting.  AVS was given to patient prior to departure.  [name] [facility] [phone]

## 2019-04-05 ENCOUNTER — Other Ambulatory Visit: Payer: Medicare Other

## 2019-04-05 DIAGNOSIS — Z7689 Persons encountering health services in other specified circumstances: Secondary | ICD-10-CM | POA: Diagnosis not present

## 2019-04-05 DIAGNOSIS — K219 Gastro-esophageal reflux disease without esophagitis: Secondary | ICD-10-CM

## 2019-04-05 DIAGNOSIS — I10 Essential (primary) hypertension: Secondary | ICD-10-CM | POA: Diagnosis not present

## 2019-04-05 DIAGNOSIS — E782 Mixed hyperlipidemia: Secondary | ICD-10-CM

## 2019-04-05 DIAGNOSIS — Z125 Encounter for screening for malignant neoplasm of prostate: Secondary | ICD-10-CM

## 2019-04-05 LAB — HEMOGLOBIN A1C
Est. average glucose Bld gHb Est-mCnc: 123 mg/dL
Hgb A1c MFr Bld: 5.9 % — ABNORMAL HIGH (ref 4.8–5.6)

## 2019-04-06 LAB — LIPID PANEL
Chol/HDL Ratio: 3.1 ratio (ref 0.0–5.0)
Cholesterol, Total: 102 mg/dL (ref 100–199)
HDL: 33 mg/dL — ABNORMAL LOW (ref >39)
LDL Chol Calc (NIH): 49 mg/dL (ref 0–99)
Triglycerides: 109 mg/dL (ref 0–149)
VLDL Cholesterol Cal: 20 mg/dL (ref 5–40)

## 2019-04-06 LAB — CARDIOVASCULAR RISK ASSESSMENT

## 2019-04-08 LAB — CBC WITH DIFFERENTIAL/PLATELET

## 2019-04-09 LAB — SPECIMEN STATUS REPORT

## 2019-04-09 LAB — COMP. METABOLIC PANEL (12)
AST: 15 IU/L (ref 0–40)
Albumin/Globulin Ratio: 1.7 (ref 1.2–2.2)
Albumin: 4.1 g/dL (ref 3.8–4.8)
Alkaline Phosphatase: 98 IU/L (ref 39–117)
BUN/Creatinine Ratio: 9 — ABNORMAL LOW (ref 10–24)
BUN: 11 mg/dL (ref 8–27)
Bilirubin Total: 0.2 mg/dL (ref 0.0–1.2)
Calcium: 8.6 mg/dL (ref 8.6–10.2)
Chloride: 102 mmol/L (ref 96–106)
Creatinine, Ser: 1.26 mg/dL (ref 0.76–1.27)
GFR calc Af Amer: 68 mL/min/{1.73_m2} (ref >59)
GFR calc non Af Amer: 59 mL/min/{1.73_m2} — ABNORMAL LOW (ref >59)
Globulin, Total: 2.4 g/dL (ref 1.5–4.5)
Glucose: 92 mg/dL (ref 65–99)
Potassium: 3.6 mmol/L (ref 3.5–5.2)
Sodium: 142 mmol/L (ref 134–144)
Total Protein: 6.5 g/dL (ref 6.0–8.5)

## 2019-04-09 LAB — TSH: TSH: 3.73 u[IU]/mL (ref 0.450–4.500)

## 2019-04-09 LAB — PSA: Prostate Specific Ag, Serum: 1 ng/mL (ref 0.0–4.0)

## 2019-04-10 DIAGNOSIS — R7301 Impaired fasting glucose: Secondary | ICD-10-CM | POA: Insufficient documentation

## 2019-04-10 DIAGNOSIS — Z125 Encounter for screening for malignant neoplasm of prostate: Secondary | ICD-10-CM | POA: Insufficient documentation

## 2019-04-10 NOTE — Assessment & Plan Note (Signed)
Recommend low sugar diet.  Check A1c today.

## 2019-04-10 NOTE — Assessment & Plan Note (Signed)
Continue current medications.  Fairly well controlled.  Recommend low-fat diet and exercise.

## 2019-04-10 NOTE — Assessment & Plan Note (Signed)
Fairly well controlled.  Continue current medications.  Labs done.

## 2019-04-10 NOTE — Assessment & Plan Note (Signed)
Check PSA. ?

## 2019-04-10 NOTE — Assessment & Plan Note (Signed)
Well controlled. Continue current medications  

## 2019-04-10 NOTE — Assessment & Plan Note (Signed)
Strongly recommend tobacco cessation.  Patient currently not interested.

## 2019-05-20 ENCOUNTER — Other Ambulatory Visit: Payer: Self-pay | Admitting: Family Medicine

## 2019-05-20 MED ORDER — DOXYCYCLINE HYCLATE 100 MG PO TABS: 100.0000 mg | ORAL_TABLET | Freq: Two times a day (BID) | ORAL | 0 refills | Status: DC

## 2019-05-20 NOTE — Progress Notes (Signed)
Patient called in with a tick bite that has been on for a couple of days. Redness. No other sxs. Recommend start doxy 100 mg one twice a day x 10 days. 20/0.

## 2019-05-23 ENCOUNTER — Other Ambulatory Visit: Payer: Self-pay | Admitting: Family Medicine

## 2019-05-23 DIAGNOSIS — M545 Low back pain, unspecified: Secondary | ICD-10-CM

## 2019-06-30 NOTE — Progress Notes (Addendum)
Subjective:  Patient ID: Gregory Delgado, male    DOB: 06/24/1950  Age: 69 y.o. MRN: [id number]  Chief Complaint  Patient presents with  . Hypertension  . Hyperlipidemia    HPI Gregory Delgado is a 69 year old white male who presents for his chronic follow-up of hypertension, COPD, GERD, chronic pain syndrome and hyperlipidemia.  He has been eating a sandwich a day. He was eating healthy prior to this. No exercise. Takes amlodipine for his hypertension and crestor for his hyperlipiemia.   He recently saw his psychiatrist, Barbette Merino, NP, and she discontinued clonazepam, but continued xanax. He is very agitated.   He has chronic pain in shoulders. Takes tramadol 50 mg four times a day.   Past Medical History:  Diagnosis Date  . COPD (chronic obstructive pulmonary disease) (HCC)   . GERD (gastroesophageal reflux disease)   . Hyperlipidemia   . Hypertension   . Osteoarthritis    Past Surgical History:  Procedure Laterality Date  . CERVICAL DISC SURGERY  2015   C2-C4   . KNEE SURGERY  1976    Family History  Problem Relation Age of Onset  . Diabetes Mother   . Alcoholism Father   . CVA Sister   . Congenital heart disease Daughter   . Rectal cancer Son   . Cirrhosis Son    Social History   Socioeconomic History  . Marital status: Single    Spouse name: Not on file  . Number of children: Not on file  . Years of education: Not on file  . Highest education level: Not on file  Occupational History  . Not on file  Tobacco Use  . Smoking status: Current Every Day Smoker    Packs/day: 0.50    Types: Cigarettes  . Smokeless tobacco: Never Used  Substance and Sexual Activity  . Alcohol use: Never  . Drug use: Never  . Sexual activity: Not on file  Other Topics Concern  . Not on file  Social History Narrative  . Not on file   Social Determinants of Health   Financial Resource Strain:   . Difficulty of Paying Living Expenses:   Food Insecurity:   . Worried About  Programme researcher, broadcasting/film/video in the Last Year:   . Barista in the Last Year:   Transportation Needs:   . Freight forwarder (Medical):   Marland Kitchen Lack of Transportation (Non-Medical):   Physical Activity:   . Days of Exercise per Week:   . Minutes of Exercise per Session:   Stress:   . Feeling of Stress :   Social Connections:   . Frequency of Communication with Friends and Family:   . Frequency of Social Gatherings with Friends and Family:   . Attends Religious Services:   . Active Member of Clubs or Organizations:   . Attends Banker Meetings:   Marland Kitchen Marital Status:     Review of Systems  Constitutional: Positive for fatigue (Poor sleep 1-2 hours at a time. Does not have a sleep schedule.). Negative for chills, diaphoresis and fever.  HENT: Negative for congestion, ear pain and sore throat.   Respiratory: Positive for shortness of breath (with exertion). Negative for cough.   Cardiovascular: Positive for palpitations (racing at times). Negative for chest pain and leg swelling.  Gastrointestinal: Positive for nausea. Negative for abdominal pain, constipation, diarrhea and vomiting.  Endocrine: Negative for polydipsia, polyphagia and polyuria.  Genitourinary: Negative for dysuria, frequency and urgency.  Musculoskeletal: Positive for  arthralgias (BL shoulders and BL legs ache at night about every other night.) and back pain. Negative for myalgias.  Neurological: Positive for light-headedness. Negative for dizziness and headaches.  Psychiatric/Behavioral: Positive for dysphoric mood. The patient is nervous/anxious.      Objective:  BP (!) 148/70   Pulse 100   Temp (!) 97.5 F (36.4 C)   Resp 16   Ht 5\' 8"  (1.727 m)   Wt 173 lb 6.4 oz (78.7 kg)   BMI 26.37 kg/m   BP/Weight 07/04/2019 3/76/2831  Systolic BP 517 616  Diastolic BP 70 72  Wt. (Lbs) 173.4 176.4  BMI 26.37 26.82    Physical Exam Vitals reviewed.  Constitutional:      Appearance: Normal appearance.  He is normal weight.  Cardiovascular:     Rate and Rhythm: Normal rate and regular rhythm.  Pulmonary:     Effort: Pulmonary effort is normal.     Breath sounds: Normal breath sounds.  Abdominal:     General: Abdomen is flat. Bowel sounds are normal.     Palpations: Abdomen is soft.  Neurological:     Mental Status: He is alert and oriented to person, place, and time.  Psychiatric:        Behavior: Behavior normal.     Comments: Agitated. Anxious.     Diabetic Foot Exam - Simple   No data filed       Lab Results  Component Value Date   WBC CANCELED 04/05/2019   HGB CANCELED 04/05/2019   HCT CANCELED 04/05/2019   PLT CANCELED 04/05/2019   GLUCOSE 92 04/05/2019   CHOL 102 04/05/2019   TRIG 109 04/05/2019   HDL 33 (L) 04/05/2019   LDLCALC 49 04/05/2019   AST 15 04/05/2019   NA 142 04/05/2019   K 3.6 04/05/2019   CL 102 04/05/2019   CREATININE 1.26 04/05/2019   BUN 11 04/05/2019   TSH 3.730 04/05/2019   HGBA1C 5.9 (H) 04/05/2019      Assessment & Plan:   1. Essential hypertension, benign Not quite at goal  Add hctz 25 mg once daily. Continue to work on eating a healthy diet and exercise.  Labs drawn today. - CBC with Differential/Platelet - Comprehensive metabolic panel  2. Mixed hyperlipidemia Well controlled.  No changes to medicines.  Continue to work on eating a healthy diet and exercise.  Labs drawn today.  - Lipid panel  3. Insomnia due to other mental disorder Start on trazodone 50 mg once daily at night.  Education given.   4. Moderate recurrent major depression ([place])  Education given. Pt to call psychiatry if needs alternative for insomnia or if needs alternative for anxiety.  Not suicidal.  Meds ordered this encounter  Medications  . traZODone (DESYREL) 50 MG tablet    Sig: Take 0.5-1 tablets (25-50 mg total) by mouth at bedtime as needed for sleep.    Dispense:  30 tablet    Refill:  3  . albuterol (PROAIR HFA) 108 (90 Base) MCG/ACT  inhaler    Sig: Inhale 2 puffs into the lungs every 6 (six) hours as needed for wheezing or shortness of breath.    Dispense:  18 g    Refill:  1    Orders Placed This Encounter  Procedures  . CBC with Differential/Platelet  . Comprehensive metabolic panel  . Lipid panel     Follow-up: No follow-ups on file.  An After Visit Summary was printed and given to the patient.  [name] [facility] [phone]

## 2019-07-04 ENCOUNTER — Other Ambulatory Visit: Payer: Self-pay

## 2019-07-04 ENCOUNTER — Ambulatory Visit (INDEPENDENT_AMBULATORY_CARE_PROVIDER_SITE_OTHER): Payer: Medicare Other | Admitting: Family Medicine

## 2019-07-04 ENCOUNTER — Encounter: Payer: Self-pay | Admitting: Family Medicine

## 2019-07-04 VITALS — BP 148/70 | HR 100 | Temp 97.5°F | Resp 16 | Ht 68.0 in | Wt 173.4 lb

## 2019-07-04 DIAGNOSIS — F99 Mental disorder, not otherwise specified: Secondary | ICD-10-CM

## 2019-07-04 DIAGNOSIS — E782 Mixed hyperlipidemia: Secondary | ICD-10-CM

## 2019-07-04 DIAGNOSIS — F331 Major depressive disorder, recurrent, moderate: Secondary | ICD-10-CM | POA: Diagnosis not present

## 2019-07-04 DIAGNOSIS — F5105 Insomnia due to other mental disorder: Secondary | ICD-10-CM | POA: Diagnosis not present

## 2019-07-04 DIAGNOSIS — I10 Essential (primary) hypertension: Secondary | ICD-10-CM | POA: Diagnosis not present

## 2019-07-04 MED ORDER — HYDROCHLOROTHIAZIDE 25 MG PO TABS: 25.0000 mg | ORAL_TABLET | Freq: Every day | ORAL | 1 refills | Status: DC

## 2019-07-04 MED ORDER — ALBUTEROL SULFATE HFA 108 (90 BASE) MCG/ACT IN AERS: 2.0000 | INHALATION_SPRAY | Freq: Four times a day (QID) | RESPIRATORY_TRACT | 1 refills | Status: DC | PRN

## 2019-07-04 MED ORDER — TRAZODONE HCL 50 MG PO TABS: 25.0000 mg | ORAL_TABLET | Freq: Every evening | ORAL | 3 refills | Status: DC | PRN

## 2019-07-04 NOTE — Addendum Note (Signed)
Addended byBlane Ohara on: 07/04/2019 11:43 AM   Modules accepted: Orders

## 2019-07-04 NOTE — Patient Instructions (Signed)
Start on trazadone 50 mg once daily at night insomnia.   Generalized Anxiety Disorder, Adult Generalized anxiety disorder (GAD) is a mental health disorder. People with this condition constantly worry about everyday events. Unlike normal anxiety, worry related to GAD is not triggered by a specific event. These worries also do not fade or get better with time. GAD interferes with life functions, including relationships, work, and school. GAD can vary from mild to severe. People with severe GAD can have intense waves of anxiety with physical symptoms (panic attacks). What are the causes? The exact cause of GAD is not known. What increases the risk? This condition is more likely to develop in:  Women.  People who have a family history of anxiety disorders.  People who are very shy.  People who experience very stressful life events, such as the death of a loved one.  People who have a very stressful family environment. What are the signs or symptoms? People with GAD often worry excessively about many things in their lives, such as their health and family. They may also be overly concerned about:  Doing well at work.  Being on time.  Natural disasters.  Friendships. Physical symptoms of GAD include:  Fatigue.  Muscle tension or having muscle twitches.  Trembling or feeling shaky.  Being easily startled.  Feeling like your heart is pounding or racing.  Feeling out of breath or like you cannot take a deep breath.  Having trouble falling asleep or staying asleep.  Sweating.  Nausea, diarrhea, or irritable bowel syndrome (IBS).  Headaches.  Trouble concentrating or remembering facts.  Restlessness.  Irritability. How is this diagnosed? Your health care provider can diagnose GAD based on your symptoms and medical history. You will also have a physical exam. The health care provider will ask specific questions about your symptoms, including how severe they are, when they  started, and if they come and go. Your health care provider may ask you about your use of alcohol or drugs, including prescription medicines. Your health care provider may refer you to a mental health specialist for further evaluation. Your health care provider will do a thorough examination and may perform additional tests to rule out other possible causes of your symptoms. To be diagnosed with GAD, a person must have anxiety that:  Is out of his or her control.  Affects several different aspects of his or her life, such as work and relationships.  Causes distress that makes him or her unable to take part in normal activities.  Includes at least three physical symptoms of GAD, such as restlessness, fatigue, trouble concentrating, irritability, muscle tension, or sleep problems. Before your health care provider can confirm a diagnosis of GAD, these symptoms must be present more days than they are not, and they must last for six months or longer. How is this treated? The following therapies are usually used to treat GAD:  Medicine. Antidepressant medicine is usually prescribed for long-term daily control. Antianxiety medicines may be added in severe cases, especially when panic attacks occur.  Talk therapy (psychotherapy). Certain types of talk therapy can be helpful in treating GAD by providing support, education, and guidance. Options include: ? Cognitive behavioral therapy (CBT). People learn coping skills and techniques to ease their anxiety. They learn to identify unrealistic or negative thoughts and behaviors and to replace them with positive ones. ? Acceptance and commitment therapy (ACT). This treatment teaches people how to be mindful as a way to cope with unwanted thoughts and  feelings. ? Biofeedback. This process trains you to manage your body's response (physiological response) through breathing techniques and relaxation methods. You will work with a therapist while machines are used  to monitor your physical symptoms.  Stress management techniques. These include yoga, meditation, and exercise. A mental health specialist can help determine which treatment is best for you. Some people see improvement with one type of therapy. However, other people require a combination of therapies. Follow these instructions at home:  Take over-the-counter and prescription medicines only as told by your health care provider.  Try to maintain a normal routine.  Try to anticipate stressful situations and allow extra time to manage them.  Practice any stress management or self-calming techniques as taught by your health care provider.  Do not punish yourself for setbacks or for not making progress.  Try to recognize your accomplishments, even if they are small.  Keep all follow-up visits as told by your health care provider. This is important. Contact a health care provider if:  Your symptoms do not get better.  Your symptoms get worse.  You have signs of depression, such as: ? A persistently sad, cranky, or irritable mood. ? Loss of enjoyment in activities that used to bring you joy. ? Change in weight or eating. ? Changes in sleeping habits. ? Avoiding friends or family members. ? Loss of energy for normal tasks. ? Feelings of guilt or worthlessness. Get help right away if:  You have serious thoughts about hurting yourself or others. If you ever feel like you may hurt yourself or others, or have thoughts about taking your own life, get help right away. You can go to your nearest emergency department or call:  Your local emergency services (911 in the [place]  A suicide crisis helpline, such as the [place] at (203) 049-4151. This is open 24 hours a day. Summary  Generalized anxiety disorder (GAD) is a mental health disorder that involves worry that is not triggered by a specific event.  People with GAD often worry excessively about many things  in their lives, such as their health and family.  GAD may cause physical symptoms such as restlessness, trouble concentrating, sleep problems, frequent sweating, nausea, diarrhea, headaches, and trembling or muscle twitching.  A mental health specialist can help determine which treatment is best for you. Some people see improvement with one type of therapy. However, other people require a combination of therapies. This information is not intended to replace advice given to you by your health care provider. Make sure you discuss any questions you have with your health care provider. Document Revised: 01/09/2017 Document Reviewed: 12/18/2015 Elsevier Patient Education  [employer]   Major Depressive Disorder, Adult Major depressive disorder (MDD) is a mental health condition. MDD often makes you feel sad, hopeless, or helpless. MDD can also cause symptoms in your body. MDD can affect your:  Work.  School.  Relationships.  Other normal activities. MDD can range from mild to very bad. It may occur once (single episode MDD). It can also occur many times (recurrent MDD). The main symptoms of MDD often include:  Feeling sad, depressed, or irritable most of the time.  Loss of interest. MDD symptoms also include:  Sleeping too much or too little.  Eating too much or too little.  A change in your weight.  Feeling tired (fatigue) or having low energy.  Feeling worthless.  Feeling guilty.  Trouble making decisions.  Trouble thinking clearly.  Thoughts of suicide  or harming others.  Feeling weak.  Feeling agitated.  Keeping yourself from being around other people (isolation). Follow these instructions at home: Activity  Do these things as told by your doctor: ? Go back to your normal activities. ? Exercise regularly. ? Spend time outdoors. Alcohol  Talk with your doctor about how alcohol can affect your antidepressant medicines.  Do not drink alcohol. Or,  limit how much alcohol you drink. ? This means no more than 1 drink a day for nonpregnant women and 2 drinks a day for men. One drink equals one of these:  12 oz of beer.  5 oz of wine.  1 oz of hard liquor. General instructions  Take over-the-counter and prescription medicines only as told by your doctor.  Eat a healthy diet.  Get plenty of sleep.  Find activities that you enjoy. Make time to do them.  Think about joining a support group. Your doctor may be able to suggest a group for you.  Keep all follow-up visits as told by your doctor. This is important. Where to find more information:  The [employer] on Mental Illness: ? www.nami.org  [place] [employer] of Mental Health: ? http://www.maynard.net/  National Suicide Prevention Lifeline: ? (581)063-8870. This is free, 24-hour help. Contact a doctor if:  Your symptoms get worse.  You have new symptoms. Get help right away if:  You self-harm.  You see, hear, taste, smell, or feel things that are not present (hallucinate). If you ever feel like you may hurt yourself or others, or have thoughts about taking your own life, get help right away. You can go to your nearest emergency department or call:  Your local emergency services (911 in the [place]  A suicide crisis helpline, such as the National Suicide Prevention Lifeline: ? 787-563-2295. This is open 24 hours a day. This information is not intended to replace advice given to you by your health care provider. Make sure you discuss any questions you have with your health care provider. Document Revised: 01/09/2017 Document Reviewed: 10/14/2015 Elsevier Patient Education  2020 [employer] Inc. Insomnia Insomnia is a sleep disorder that makes it difficult to fall asleep or stay asleep. Insomnia can cause fatigue, low energy, difficulty concentrating, mood swings, and poor performance at work or school. There are three different ways to classify  insomnia:  Difficulty falling asleep.  Difficulty staying asleep.  Waking up too early in the morning. Any type of insomnia can be long-term (chronic) or short-term (acute). Both are common. Short-term insomnia usually lasts for three months or less. Chronic insomnia occurs at least three times a week for longer than three months. What are the causes? Insomnia may be caused by another condition, situation, or substance, such as:  Anxiety.  Certain medicines.  Gastroesophageal reflux disease (GERD) or other gastrointestinal conditions.  Asthma or other breathing conditions.  Restless legs syndrome, sleep apnea, or other sleep disorders.  Chronic pain.  Menopause.  Stroke.  Abuse of alcohol, tobacco, or illegal drugs.  Mental health conditions, such as depression.  Caffeine.  Neurological disorders, such as Alzheimer's disease.  An overactive thyroid (hyperthyroidism). Sometimes, the cause of insomnia may not be known. What increases the risk? Risk factors for insomnia include:  Gender. Women are affected more often than men.  Age. Insomnia is more common as you get older.  Stress.  Lack of exercise.  Irregular work schedule or working night shifts.  Traveling between different time zones.  Certain medical and mental health conditions. What are  the signs or symptoms? If you have insomnia, the main symptom is having trouble falling asleep or having trouble staying asleep. This may lead to other symptoms, such as:  Feeling fatigued or having low energy.  Feeling nervous about going to sleep.  Not feeling rested in the morning.  Having trouble concentrating.  Feeling irritable, anxious, or depressed. How is this diagnosed? This condition may be diagnosed based on:  Your symptoms and medical history. Your health care provider may ask about: ? Your sleep habits. ? Any medical conditions you have. ? Your mental health.  A physical exam. How is this  treated? Treatment for insomnia depends on the cause. Treatment may focus on treating an underlying condition that is causing insomnia. Treatment may also include:  Medicines to help you sleep.  Counseling or therapy.  Lifestyle adjustments to help you sleep better. Follow these instructions at home: Eating and drinking   Limit or avoid alcohol, caffeinated beverages, and cigarettes, especially close to bedtime. These can disrupt your sleep.  Do not eat a large meal or eat spicy foods right before bedtime. This can lead to digestive discomfort that can make it hard for you to sleep. Sleep habits   Keep a sleep diary to help you and your health care provider figure out what could be causing your insomnia. Write down: ? When you sleep. ? When you wake up during the night. ? How well you sleep. ? How rested you feel the next day. ? Any side effects of medicines you are taking. ? What you eat and drink.  Make your bedroom a dark, comfortable place where it is easy to fall asleep. ? Put up shades or blackout curtains to block light from outside. ? Use a white noise machine to block noise. ? Keep the temperature cool.  Limit screen use before bedtime. This includes: ? Watching TV. ? Using your smartphone, tablet, or computer.  Stick to a routine that includes going to bed and waking up at the same times every day and night. This can help you fall asleep faster. Consider making a quiet activity, such as reading, part of your nighttime routine.  Try to avoid taking naps during the day so that you sleep better at night.  Get out of bed if you are still awake after 15 minutes of trying to sleep. Keep the lights down, but try reading or doing a quiet activity. When you feel sleepy, go back to bed. General instructions  Take over-the-counter and prescription medicines only as told by your health care provider.  Exercise regularly, as told by your health care provider. Avoid exercise  starting several hours before bedtime.  Use relaxation techniques to manage stress. Ask your health care provider to suggest some techniques that may work well for you. These may include: ? Breathing exercises. ? Routines to release muscle tension. ? Visualizing peaceful scenes.  Make sure that you drive carefully. Avoid driving if you feel very sleepy.  Keep all follow-up visits as told by your health care provider. This is important. Contact a health care provider if:  You are tired throughout the day.  You have trouble in your daily routine due to sleepiness.  You continue to have sleep problems, or your sleep problems get worse. Get help right away if:  You have serious thoughts about hurting yourself or someone else. If you ever feel like you may hurt yourself or others, or have thoughts about taking your own life, get help right away.  You can go to your nearest emergency department or call:  Your local emergency services (911 in the [place]  A suicide crisis helpline, such as the National Suicide Prevention Lifeline at (725)011-38131-970-850-9503. This is open 24 hours a day. Summary  Insomnia is a sleep disorder that makes it difficult to fall asleep or stay asleep.  Insomnia can be long-term (chronic) or short-term (acute).  Treatment for insomnia depends on the cause. Treatment may focus on treating an underlying condition that is causing insomnia.  Keep a sleep diary to help you and your health care provider figure out what could be causing your insomnia. This information is not intended to replace advice given to you by your health care provider. Make sure you discuss any questions you have with your health care provider. Document Revised: 01/09/2017 Document Reviewed: 11/06/2016 Elsevier Patient Education  2020 [employer]

## 2019-07-05 LAB — CBC WITH DIFFERENTIAL/PLATELET
Basophils Absolute: 0.1 10*3/uL (ref 0.0–0.2)
Basos: 1 %
EOS (ABSOLUTE): 0.1 10*3/uL (ref 0.0–0.4)
Eos: 1 %
Hematocrit: 45.2 % (ref 37.5–51.0)
Hemoglobin: 16 g/dL (ref 13.0–17.7)
Immature Grans (Abs): 0 10*3/uL (ref 0.0–0.1)
Immature Granulocytes: 0 %
Lymphocytes Absolute: 2.1 10*3/uL (ref 0.7–3.1)
Lymphs: 17 %
MCH: 32.9 pg (ref 26.6–33.0)
MCHC: 35.4 g/dL (ref 31.5–35.7)
MCV: 93 fL (ref 79–97)
Monocytes Absolute: 0.7 10*3/uL (ref 0.1–0.9)
Monocytes: 6 %
Neutrophils Absolute: 9.2 10*3/uL — ABNORMAL HIGH (ref 1.4–7.0)
Neutrophils: 75 %
Platelets: 304 10*3/uL (ref 150–450)
RBC: 4.86 x10E6/uL (ref 4.14–5.80)
RDW: 13.1 % (ref 11.6–15.4)
WBC: 12.2 10*3/uL — ABNORMAL HIGH (ref 3.4–10.8)

## 2019-07-05 LAB — LIPID PANEL
Chol/HDL Ratio: 4.1 ratio (ref 0.0–5.0)
Cholesterol, Total: 150 mg/dL (ref 100–199)
HDL: 37 mg/dL — ABNORMAL LOW (ref >39)
LDL Chol Calc (NIH): 92 mg/dL (ref 0–99)
Triglycerides: 116 mg/dL (ref 0–149)
VLDL Cholesterol Cal: 21 mg/dL (ref 5–40)

## 2019-07-05 LAB — CARDIOVASCULAR RISK ASSESSMENT

## 2019-07-05 LAB — COMPREHENSIVE METABOLIC PANEL
ALT: 15 IU/L (ref 0–44)
AST: 18 IU/L (ref 0–40)
Albumin/Globulin Ratio: 1.4 (ref 1.2–2.2)
Albumin: 4.6 g/dL (ref 3.8–4.8)
Alkaline Phosphatase: 121 IU/L (ref 48–121)
BUN/Creatinine Ratio: 10 (ref 10–24)
BUN: 13 mg/dL (ref 8–27)
Bilirubin Total: 0.7 mg/dL (ref 0.0–1.2)
CO2: 24 mmol/L (ref 20–29)
Calcium: 9.9 mg/dL (ref 8.6–10.2)
Chloride: 100 mmol/L (ref 96–106)
Creatinine, Ser: 1.29 mg/dL — ABNORMAL HIGH (ref 0.76–1.27)
GFR calc Af Amer: 66 mL/min/{1.73_m2} (ref >59)
GFR calc non Af Amer: 57 mL/min/{1.73_m2} — ABNORMAL LOW (ref >59)
Globulin, Total: 3.3 g/dL (ref 1.5–4.5)
Glucose: 128 mg/dL — ABNORMAL HIGH (ref 65–99)
Potassium: 4.3 mmol/L (ref 3.5–5.2)
Sodium: 142 mmol/L (ref 134–144)
Total Protein: 7.9 g/dL (ref 6.0–8.5)

## 2019-07-26 ENCOUNTER — Other Ambulatory Visit: Payer: Self-pay

## 2019-07-26 MED ORDER — ALBUTEROL SULFATE HFA 108 (90 BASE) MCG/ACT IN AERS: 2.0000 | INHALATION_SPRAY | Freq: Four times a day (QID) | RESPIRATORY_TRACT | 1 refills | Status: DC | PRN

## 2019-08-19 ENCOUNTER — Other Ambulatory Visit: Payer: Self-pay | Admitting: Family Medicine

## 2019-08-19 DIAGNOSIS — M545 Low back pain, unspecified: Secondary | ICD-10-CM

## 2019-08-23 ENCOUNTER — Other Ambulatory Visit: Payer: Self-pay | Admitting: Family Medicine

## 2019-09-27 ENCOUNTER — Other Ambulatory Visit: Payer: Self-pay | Admitting: Physician Assistant

## 2019-10-05 ENCOUNTER — Other Ambulatory Visit: Payer: Self-pay

## 2019-10-05 ENCOUNTER — Ambulatory Visit (INDEPENDENT_AMBULATORY_CARE_PROVIDER_SITE_OTHER): Payer: Medicare Other | Admitting: Family Medicine

## 2019-10-05 VITALS — BP 124/64 | HR 74 | Temp 97.4°F | Resp 14 | Ht 68.0 in | Wt 181.8 lb

## 2019-10-05 DIAGNOSIS — M545 Low back pain, unspecified: Secondary | ICD-10-CM

## 2019-10-05 DIAGNOSIS — K219 Gastro-esophageal reflux disease without esophagitis: Secondary | ICD-10-CM | POA: Diagnosis not present

## 2019-10-05 DIAGNOSIS — R7301 Impaired fasting glucose: Secondary | ICD-10-CM

## 2019-10-05 DIAGNOSIS — I10 Essential (primary) hypertension: Secondary | ICD-10-CM | POA: Diagnosis not present

## 2019-10-05 DIAGNOSIS — F330 Major depressive disorder, recurrent, mild: Secondary | ICD-10-CM

## 2019-10-05 DIAGNOSIS — E782 Mixed hyperlipidemia: Secondary | ICD-10-CM

## 2019-10-05 MED ORDER — PANTOPRAZOLE SODIUM 40 MG PO TBEC: 40.0000 mg | DELAYED_RELEASE_TABLET | Freq: Two times a day (BID) | ORAL | 3 refills | Status: DC

## 2019-10-05 MED ORDER — ROSUVASTATIN CALCIUM 20 MG PO TABS: 20.0000 mg | ORAL_TABLET | Freq: Every day | ORAL | 3 refills | Status: DC

## 2019-10-05 MED ORDER — AMLODIPINE BESYLATE 10 MG PO TABS: 10.0000 mg | ORAL_TABLET | Freq: Every day | ORAL | 3 refills | Status: DC

## 2019-10-05 NOTE — Progress Notes (Signed)
Subjective:  Patient ID: Gregory Delgado, male    DOB: May 05, 1950  Age: 69 y.o. MRN: [id number]  Chief Complaint  Patient presents with  . Hypertension    HPI  Hypertension This is a chronic problem. Currently taking amlodipine for bp  The problem is controlled. Pertinent negatives include no chest pain, headaches or shortness of breath. Risk factors for coronary artery disease include dyslipidemia.  Hyperlipidemia This is a chronic problem. The current episode started more than 1 year ago. The problem is controlled. Current antihyperlipidemic treatment includes statins and diet change (rosuvastatin 20 mg once at night.). Compliance problems include adherence to exercise.  Risk factors for coronary artery disease include hypertension.    Chronic pain related to his low back and left shoulder.  Patient is currently well controlled on tramadol.  Current Outpatient Medications on File Prior to Visit  Medication Sig Dispense Refill  . albuterol (VENTOLIN HFA) 108 (90 Base) MCG/ACT inhaler USE 2 INHALATIONS BY MOUTH  EVERY 6 HOURS AS NEEDED FOR WHEEZING OR SHORTNESS OF  BREATH 34 g 3  . ALPRAZolam (XANAX) 1 MG tablet Take 1 mg by mouth 2 (two) times daily.     Marland Kitchen aspirin 81 MG EC tablet Take by mouth.    . traMADol (ULTRAM) 50 MG tablet TAKE 1 TABLET BY MOUTH FOUR TIMES A DAY AS NEEDED 120 tablet 2   No current facility-administered medications on file prior to visit.   Past Medical History:  Diagnosis Date  . COPD (chronic obstructive pulmonary disease) (HCC)   . GERD (gastroesophageal reflux disease)   . Hyperlipidemia   . Hypertension   . Osteoarthritis    Past Surgical History:  Procedure Laterality Date  . CERVICAL DISC SURGERY  2015   C2-C4   . KNEE SURGERY  1976    Family History  Problem Relation Age of Onset  . Diabetes Mother   . Alcoholism Father   . CVA Sister   . Congenital heart disease Daughter   . Rectal cancer Son   . Cirrhosis Son    Social History    Socioeconomic History  . Marital status: Single    Spouse name: Not on file  . Number of children: Not on file  . Years of education: Not on file  . Highest education level: Not on file  Occupational History  . Not on file  Tobacco Use  . Smoking status: Current Every Day Smoker    Packs/day: 0.50    Types: Cigarettes  . Smokeless tobacco: Never Used  Substance and Sexual Activity  . Alcohol use: Never  . Drug use: Never  . Sexual activity: Not on file  Other Topics Concern  . Not on file  Social History Narrative  . Not on file   Social Determinants of Health   Financial Resource Strain:   . Difficulty of Paying Living Expenses: Not on file  Food Insecurity:   . Worried About Programme researcher, broadcasting/film/video in the Last Year: Not on file  . Ran Out of Food in the Last Year: Not on file  Transportation Needs:   . Lack of Transportation (Medical): Not on file  . Lack of Transportation (Non-Medical): Not on file  Physical Activity:   . Days of Exercise per Week: Not on file  . Minutes of Exercise per Session: Not on file  Stress:   . Feeling of Stress : Not on file  Social Connections:   . Frequency of Communication with Friends and Family: Not  on file  . Frequency of Social Gatherings with Friends and Family: Not on file  . Attends Religious Services: Not on file  . Active Member of Clubs or Organizations: Not on file  . Attends Banker Meetings: Not on file  . Marital Status: Not on file    Review of Systems  Constitutional: Negative for chills, fatigue and fever.  HENT: Positive for ear discharge and ear pain (itch). Negative for congestion and sore throat.   Respiratory: Negative for cough and shortness of breath.   Cardiovascular: Negative for chest pain and palpitations.  Gastrointestinal: Negative for abdominal pain, constipation, diarrhea, nausea and vomiting.  Genitourinary: Negative for dysuria and frequency.  Musculoskeletal: Positive for back pain  (low back pain and left shoulder pain.  Tramadol helps.). Negative for arthralgias and myalgias.  Neurological: Positive for weakness. Negative for dizziness and headaches.  Psychiatric/Behavioral: Negative for dysphoric mood. The patient is not nervous/anxious.      Objective:  BP 124/64   Pulse 74   Temp (!) 97.4 F (36.3 C)   Resp 14   Ht 5\' 8"  (1.727 m)   Wt 181 lb 12.8 oz (82.5 kg)   BMI 27.64 kg/m   BP/Weight 10/05/2019 07/04/2019 04/04/2019  Systolic BP 124 148 140  Diastolic BP 64 70 72  Wt. (Lbs) 181.8 173.4 176.4  BMI 27.64 26.37 26.82    Physical Exam Vitals reviewed.  Constitutional:      Appearance: Normal appearance.  HENT:     Right Ear: Tympanic membrane and ear canal normal.     Left Ear: Tympanic membrane and ear canal normal.  Neck:     Vascular: No carotid bruit.  Cardiovascular:     Rate and Rhythm: Normal rate and regular rhythm.     Pulses: Normal pulses.     Heart sounds: Normal heart sounds.  Pulmonary:     Effort: Pulmonary effort is normal.     Breath sounds: Normal breath sounds. No wheezing, rhonchi or rales.  Abdominal:     General: Bowel sounds are normal.     Palpations: Abdomen is soft.     Tenderness: There is no abdominal tenderness.  Neurological:     Mental Status: He is alert.  Psychiatric:        Mood and Affect: Mood normal.        Behavior: Behavior normal.     Diabetic Foot Exam - Simple   No data filed       Lab Results  Component Value Date   WBC 10.1 10/05/2019   HGB 14.4 10/05/2019   HCT 42.3 10/05/2019   PLT 250 10/05/2019   GLUCOSE 106 (H) 10/05/2019   CHOL 133 10/05/2019   TRIG 124 10/05/2019   HDL 39 (L) 10/05/2019   LDLCALC 72 10/05/2019   ALT 12 10/05/2019   AST 13 10/05/2019   NA 145 (H) 10/05/2019   K 4.1 10/05/2019   CL 102 10/05/2019   CREATININE 1.13 10/05/2019   BUN 9 10/05/2019   CO2 28 10/05/2019   TSH 3.730 04/05/2019   HGBA1C 6.0 (H) 10/05/2019      Assessment & Plan:   1.  Essential hypertension, benign Well controlled.  No changes to medicines.  Continue to work on eating a healthy diet and exercise.  Labs drawn today.  - CBC with Differential/Platelet - Comprehensive metabolic panel - amLODipine (NORVASC) 10 MG tablet; Take 1 tablet (10 mg total) by mouth daily.  Dispense: 90 tablet; Refill: 3 -  Cardiovascular Risk Assessment  2. Impaired fasting glucose Recommend continue to work on eating healthy diet and exercise. - Hemoglobin A1c  3. GERD without esophagitis The current medical regimen is effective;  continue present plan and medications. - pantoprazole (PROTONIX) 40 MG tablet; Take 1 tablet (40 mg total) by mouth 2 (two) times daily.  Dispense: 180 tablet; Refill: 3  4. Mixed hyperlipidemia Well controlled.  No changes to medicines.  Continue to work on eating a healthy diet and exercise.  Labs drawn today.  - Lipid panel - rosuvastatin (CRESTOR) 20 MG tablet; Take 1 tablet (20 mg total) by mouth daily.  Dispense: 90 tablet; Refill: 3  5. Mild recurrent major depression (HCC) Defer mgmt to psychiatry.  6. Lumbar back pain  The current medical regimen is effective;  continue present plan and medications.  Recommend covid 19 vaccination. Recommend return for flu shot in the fall.  Meds ordered this encounter  Medications  . rosuvastatin (CRESTOR) 20 MG tablet    Sig: Take 1 tablet (20 mg total) by mouth daily.    Dispense:  90 tablet    Refill:  3    Requesting 1 year supply  . pantoprazole (PROTONIX) 40 MG tablet    Sig: Take 1 tablet (40 mg total) by mouth 2 (two) times daily.    Dispense:  180 tablet    Refill:  3    Requesting 1 year supply  . amLODipine (NORVASC) 10 MG tablet    Sig: Take 1 tablet (10 mg total) by mouth daily.    Dispense:  90 tablet    Refill:  3    Requesting 1 year supply    Orders Placed This Encounter  Procedures  . CBC with Differential/Platelet  . Comprehensive metabolic panel  . Lipid panel    . Hemoglobin A1c  . Cardiovascular Risk Assessment     Follow-up: Return in about 6 months (around 04/06/2020).  An After Visit Summary was printed and given to the patient.  Blane Ohara [facility] [phone]

## 2019-10-05 NOTE — Patient Instructions (Signed)
Recommend covid 19 vaccination. Recommend return for flu shot in the fall.

## 2019-10-06 LAB — COMPREHENSIVE METABOLIC PANEL
ALT: 12 IU/L (ref 0–44)
AST: 13 IU/L (ref 0–40)
Albumin/Globulin Ratio: 1.7 (ref 1.2–2.2)
Albumin: 4.4 g/dL (ref 3.8–4.8)
Alkaline Phosphatase: 103 IU/L (ref 48–121)
BUN/Creatinine Ratio: 8 — ABNORMAL LOW (ref 10–24)
BUN: 9 mg/dL (ref 8–27)
Bilirubin Total: 0.5 mg/dL (ref 0.0–1.2)
CO2: 28 mmol/L (ref 20–29)
Calcium: 9.1 mg/dL (ref 8.6–10.2)
Chloride: 102 mmol/L (ref 96–106)
Creatinine, Ser: 1.13 mg/dL (ref 0.76–1.27)
GFR calc Af Amer: 78 mL/min/{1.73_m2} (ref >59)
GFR calc non Af Amer: 67 mL/min/{1.73_m2} (ref >59)
Globulin, Total: 2.6 g/dL (ref 1.5–4.5)
Glucose: 106 mg/dL — ABNORMAL HIGH (ref 65–99)
Potassium: 4.1 mmol/L (ref 3.5–5.2)
Sodium: 145 mmol/L — ABNORMAL HIGH (ref 134–144)
Total Protein: 7 g/dL (ref 6.0–8.5)

## 2019-10-06 LAB — CBC WITH DIFFERENTIAL/PLATELET
Basophils Absolute: 0.1 10*3/uL (ref 0.0–0.2)
Basos: 1 %
EOS (ABSOLUTE): 0.3 10*3/uL (ref 0.0–0.4)
Eos: 3 %
Hematocrit: 42.3 % (ref 37.5–51.0)
Hemoglobin: 14.4 g/dL (ref 13.0–17.7)
Immature Grans (Abs): 0 10*3/uL (ref 0.0–0.1)
Immature Granulocytes: 0 %
Lymphocytes Absolute: 2.3 10*3/uL (ref 0.7–3.1)
Lymphs: 23 %
MCH: 31.7 pg (ref 26.6–33.0)
MCHC: 34 g/dL (ref 31.5–35.7)
MCV: 93 fL (ref 79–97)
Monocytes Absolute: 0.7 10*3/uL (ref 0.1–0.9)
Monocytes: 7 %
Neutrophils Absolute: 6.7 10*3/uL (ref 1.4–7.0)
Neutrophils: 66 %
Platelets: 250 10*3/uL (ref 150–450)
RBC: 4.54 x10E6/uL (ref 4.14–5.80)
RDW: 13.2 % (ref 11.6–15.4)
WBC: 10.1 10*3/uL (ref 3.4–10.8)

## 2019-10-06 LAB — LIPID PANEL
Chol/HDL Ratio: 3.4 ratio (ref 0.0–5.0)
Cholesterol, Total: 133 mg/dL (ref 100–199)
HDL: 39 mg/dL — ABNORMAL LOW (ref >39)
LDL Chol Calc (NIH): 72 mg/dL (ref 0–99)
Triglycerides: 124 mg/dL (ref 0–149)
VLDL Cholesterol Cal: 22 mg/dL (ref 5–40)

## 2019-10-06 LAB — HEMOGLOBIN A1C
Est. average glucose Bld gHb Est-mCnc: 126 mg/dL
Hgb A1c MFr Bld: 6 % — ABNORMAL HIGH (ref 4.8–5.6)

## 2019-10-06 LAB — CARDIOVASCULAR RISK ASSESSMENT

## 2019-10-17 ENCOUNTER — Encounter: Payer: Self-pay | Admitting: Family Medicine

## 2019-11-15 ENCOUNTER — Other Ambulatory Visit: Payer: Self-pay | Admitting: Legal Medicine

## 2019-11-15 DIAGNOSIS — M545 Low back pain, unspecified: Secondary | ICD-10-CM

## 2019-11-15 NOTE — Telephone Encounter (Signed)
This is your patient lp 

## 2019-11-24 ENCOUNTER — Telehealth: Payer: Self-pay

## 2019-11-24 NOTE — Telephone Encounter (Signed)
Gregory Delgado called to report that he awakened this morning with swelling of his ear and neck.  He is uncertain if he has been bitten by something.  He took Motrin and his symptoms improved.  He was offered an appointment tomorrow morning but he wants to wait and see if his symptoms have improved.  He will call tomorrow to schedule if needed.

## 2019-12-14 ENCOUNTER — Telehealth: Payer: Self-pay | Admitting: Family Medicine

## 2019-12-14 NOTE — Progress Notes (Signed)
  Chronic Care Management   Outreach Note  12/14/2019 Name: Gregory Delgado MRN: [id number] DOB: [birth year]  Referred by: Blane Ohara, MD Reason for referral : Chronic Care Management   An unsuccessful telephone outreach was attempted today. The patient was referred to the pharmacist for assistance with care management and care coordination.   Follow Up Plan:   Gregory Delgado  Upstream Scheduler

## 2019-12-15 ENCOUNTER — Telehealth: Payer: Self-pay | Admitting: Family Medicine

## 2019-12-15 NOTE — Chronic Care Management (AMB) (Signed)
  Chronic Care Management   Note  12/15/2019 Name: Jaequan Propes MRN: [id number] DOB: [birth year]  Tayvien Kane is a 69 y.o. year old male who is a primary care patient of Cox, Kirsten, MD. I reached out to [employer] by phone today in response to a referral sent by Mr. Kemp Wilz's PCP, Cox, Kirsten, MD.   Mr. Joynt was given information about Chronic Care Management services today including:  1. CCM service includes personalized support from designated clinical staff supervised by his physician, including individualized plan of care and coordination with other care providers 2. 24/7 contact phone numbers for assistance for urgent and routine care needs. 3. Service will only be billed when office clinical staff spend 20 minutes or more in a month to coordinate care. 4. Only one practitioner may furnish and bill the service in a calendar month. 5. The patient may stop CCM services at any time (effective at the end of the month) by phone call to the office staff.   Patient agreed to services and verbal consent obtained.   Follow up plan:   Aggie Hacker  Upstream Scheduler

## 2020-01-12 ENCOUNTER — Other Ambulatory Visit: Payer: Self-pay | Admitting: Family Medicine

## 2020-01-12 DIAGNOSIS — M545 Low back pain, unspecified: Secondary | ICD-10-CM

## 2020-01-26 NOTE — Chronic Care Management (AMB) (Deleted)
Chronic Care Management Pharmacy  Name: Gregory Delgado  MRN: [id number] DOB: Jan 22, 1951  Chief Complaint/ HPI  Gregory Delgado,  69 y.o. , male presents for their Initial CCM visit with the clinical pharmacist {CHL HP Upstream Pharm visit UEKC:[id number]}.  PCP : [name], MD  Their chronic conditions include: hypertension, COPD with emphysema, GERD without esophagitis, impaired fasting glucose, cigarette nicotine dependence with nicotine-induced disorder, mixed hyperlipidemia, other cervical disc degeneration, panic attack, chronic pain syndrome.   Office Visits:*** 10/05/2019 - recommend COVID 19 vaccination. Return for flu shot in the fall.  Consult Visit:*** 11/30/2019 - Psychiatry visit.  09/05/2019 - Psychiatry visit.  Medications: Outpatient Encounter Medications as of 01/31/2020  Medication Sig  . albuterol (VENTOLIN HFA) 108 (90 Base) MCG/ACT inhaler USE 2 INHALATIONS BY MOUTH  EVERY 6 HOURS AS NEEDED FOR WHEEZING OR SHORTNESS OF  BREATH  . ALPRAZolam (XANAX) 1 MG tablet Take 1 mg by mouth 2 (two) times daily.   Marland Kitchen amLODipine (NORVASC) 10 MG tablet Take 1 tablet (10 mg total) by mouth daily.  Marland Kitchen aspirin 81 MG EC tablet Take by mouth.  . pantoprazole (PROTONIX) 40 MG tablet Take 1 tablet (40 mg total) by mouth 2 (two) times daily.  . rosuvastatin (CRESTOR) 20 MG tablet Take 1 tablet (20 mg total) by mouth daily.  . traMADol (ULTRAM) 50 MG tablet TAKE 1 TABLET BY MOUTH FOUR TIMES A DAY AS NEEDED   No facility-administered encounter medications on file as of 01/31/2020.   No Known Allergies  SDOH Screenings   Alcohol Screen: Not on file  Depression (PHQ2-9): Low Risk   . PHQ-2 Score: 1  Financial Resource Strain: Not on file  Food Insecurity: Not on file  Housing: Not on file  Physical Activity: Not on file  Social Connections: Not on file  Stress: Not on file  Tobacco Use: High Risk  . Smoking Tobacco Use: Current Every Day Smoker  . Smokeless Tobacco Use:  Never Used  Transportation Needs: Not on file    Current Diagnosis/Assessment:  Goals Addressed   None     COPD / Asthma / Tobacco   Last spirometry score: ***  Gold Grade: {CHL HP Upstream Pharm COPD Gold AVWPV:[id number]} Current COPD Classification:  {CHL HP Upstream Pharm COPD [phone]}  Eosinophil count:  No results found for: EOSPCT%                               Eos (Absolute):  Lab Results  Component Value Date/Time   EOSABS 0.3 10/05/2019 11:47 AM    Tobacco Status:  Social History   Tobacco Use  Smoking Status Current Every Day Smoker  . Packs/day: 0.50  . Types: Cigarettes  Smokeless Tobacco Never Used    Patient has failed these meds in past: *** Patient is currently {CHL Controlled/Uncontrolled:5174765552} on the following medications: ***  Albuterol inhaler - 2 puffs every 6 hours prn wheezing or shortness of breath  Using maintenance inhaler regularly? {yes/no:20286} Frequency of rescue inhaler use:  {CHL HP Upstream Pharm Inhaler SMOL:0786754492}  We discussed:  {CHL HP Upstream Pharmacy discussion:[phone]}  Plan  Continue {CHL HP Upstream Pharmacy [phone]}  and  Impaired Fasting Glucose   Recent Relevant Labs: Lab Results  Component Value Date/Time   HGBA1C 6.0 (H) 10/05/2019 11:47 AM   HGBA1C 5.9 (H) 04/05/2019 10:31 AM     Checking BG: {CHL HP Blood Glucose Monitoring Frequency:[phone]}  Recent FBG Readings: Recent pre-meal  BG readings: *** Recent 2hr PP BG readings:  *** Recent HS BG readings: *** Patient has failed these meds in past: *** Patient is currently {CHL Controlled/Uncontrolled:[phone]} on the following medications: ***  We discussed: {CHL HP Upstream Pharmacy [phone]}  Plan  Continue {CHL HP Upstream Pharmacy Plans:[phone]}   Hyperlipidemia   LDL goal < ***  Last lipids Lab Results  Component Value Date   CHOL 133 10/05/2019   HDL 39 (L) 10/05/2019    LDLCALC 72 10/05/2019   TRIG 124 10/05/2019   CHOLHDL 3.4 10/05/2019   Hepatic Function Latest Ref Rng & Units 10/05/2019 07/04/2019 04/05/2019  Total Protein 6.0 - 8.5 g/dL 7.0 7.9 6.5  Albumin 3.8 - 4.8 g/dL 4.4 4.6 4.1  AST 0 - 40 IU/L $Remov'13 18 15  'HPIztM$ ALT 0 - 44 IU/L 12 15 -  Alk Phosphatase 48 - 121 IU/L 103 121 98  Total Bilirubin 0.0 - 1.2 mg/dL 0.5 0.7 0.2     The 10-year ASCVD risk score Mikey Bussing DC Jr., et al., 2013) is: 18.7%   Values used to calculate the score:     Age: 62 years     Sex: Male     Is Non-Hispanic African American: No     Diabetic: No     Tobacco smoker: Yes     Systolic Blood Pressure: 809 mmHg     Is BP treated: Yes     HDL Cholesterol: 39 mg/dL     Total Cholesterol: 133 mg/dL   Patient has failed these meds in past: *** Patient is currently {CHL Controlled/Uncontrolled:[phone]} on the following medications:  . ***rosuvastatin 20 mg daily  . Aspirin EC 81 mg daily   We discussed:  {CHL HP Upstream Pharmacy [phone]}  Plan  Continue {CHL HP Upstream Pharmacy Plans:[phone]}   Hypertension   BP today is:  {CHL HP UPSTREAM Pharmacist BP ranges:[phone]}  Office blood pressures are  BP Readings from Last 3 Encounters:  10/05/19 124/64  07/04/19 (!) 148/70  04/04/19 140/72    Patient has failed these meds in the past: *** Patient is currently controlled/uncontrolled*** on the following medications: ***  Amlodipine 10 mg daily   Patient checks BP at home {CHL HP BP Monitoring Frequency:[phone]}  Patient home BP readings are ranging: ***  We discussed {CHL HP Upstream Pharmacy [phone]}  Plan  Continue {CHL HP Upstream Pharmacy Plans:[phone]}   ***Panic Attack   Patient has failed these meds in past: *** Patient is currently {CHL Controlled/Uncontrolled:[phone]} on the following medications:  . ***alprazolam 1 mg bid   We discussed:  ***  Plan  Continue {CHL HP Upstream Pharmacy  Plans:[phone]}  Health Maintenance   Patient is currently {CHL Controlled/Uncontrolled:[phone]} on the following medications:  . ***pantoprazole 40 mg bid *** . Tramadol 50 mg qid prn - ***  We discussed:  ***  Plan  Continue {CHL HP Upstream Pharmacy XIPJA:[id number]}  Vaccines   Reviewed and discussed patient's vaccination history.     There is no immunization history on file for this patient.  Plan  Recommended patient receive *** vaccine in *** office.   Medication Management   Patient's preferred pharmacy is:  [facility], [address], [address], [place] [address] Phone: [phone] Fax: [phone]  [id number] - [place] [address], [address] [place] [address] Phone: [phone] Fax: [phone]  Uses pill box? {Yes  or If no, why not?:20788} Pt endorses ***% compliance  We discussed: {Pharmacy options:24294}  Plan  {US Pharmacy GBMS:11155}    Follow up: *** month phone visit  ***

## 2020-01-31 ENCOUNTER — Telehealth: Payer: Medicare Other

## 2020-02-13 ENCOUNTER — Telehealth: Payer: Self-pay | Admitting: Family Medicine

## 2020-02-13 NOTE — Progress Notes (Signed)
  Chronic Care Management   Outreach Note  02/13/2020 Name: Gregory Delgado MRN: [id number] DOB: July 13, 1950  Referred by: Blane Ohara, MD Reason for referral : Chronic Care Management   An unsuccessful telephone outreach was attempted today. The patient was referred to the pharmacist for assistance with care management and care coordination.   Follow Up Plan:   Aggie Hacker  Upstream Scheduler

## 2020-02-13 NOTE — Chronic Care Management (AMB) (Signed)
  Chronic Care Management   Note  02/13/2020 Name: Arben Packman MRN: [id number] DOB: 1950-05-04  Aulden Calise is a 70 y.o. year old male who is a primary care patient of Cox, Kirsten, MD. I reached out to [employer] by phone today in response to a referral sent by Mr. Jeanpaul Mcgregory's PCP, Cox, Kirsten, MD.   Mr. Hollars was given information about Chronic Care Management services today including:  1. CCM service includes personalized support from designated clinical staff supervised by his physician, including individualized plan of care and coordination with other care providers 2. 24/7 contact phone numbers for assistance for urgent and routine care needs. 3. Service will only be billed when office clinical staff spend 20 minutes or more in a month to coordinate care. 4. Only one practitioner may furnish and bill the service in a calendar month. 5. The patient may stop CCM services at any time (effective at the end of the month) by phone call to the office staff.   Patient agreed to services and verbal consent obtained.   Follow up plan:   Aggie Hacker  Upstream Scheduler

## 2020-03-08 ENCOUNTER — Encounter: Payer: Self-pay | Admitting: Family Medicine

## 2020-03-12 ENCOUNTER — Other Ambulatory Visit: Payer: Self-pay | Admitting: Family Medicine

## 2020-03-12 DIAGNOSIS — M545 Low back pain, unspecified: Secondary | ICD-10-CM

## 2020-03-17 LAB — FECAL OCCULT BLOOD, GUAIAC: Fecal Occult Blood: NEGATIVE

## 2020-03-17 LAB — IFOBT (OCCULT BLOOD): IFOBT: NEGATIVE

## 2020-03-26 ENCOUNTER — Telehealth: Payer: Self-pay

## 2020-03-26 NOTE — Progress Notes (Signed)
    Chronic Care Management Pharmacy Assistant   Name: Kristen Bushway  MRN: [id number] DOB: 07-10-1950  Reason for Encounter: Initial questions for appointment with Lucia Gaskins, CPP   PCP : Blane Ohara, MD  Allergies:  No Known Allergies  Medications: Outpatient Encounter Medications as of 03/26/2020  Medication Sig  . albuterol (VENTOLIN HFA) 108 (90 Base) MCG/ACT inhaler USE 2 INHALATIONS BY MOUTH  EVERY 6 HOURS AS NEEDED FOR WHEEZING OR SHORTNESS OF  BREATH  . ALPRAZolam (XANAX) 1 MG tablet Take 1 mg by mouth 2 (two) times daily.   Marland Kitchen amLODipine (NORVASC) 10 MG tablet Take 1 tablet (10 mg total) by mouth daily.  Marland Kitchen aspirin 81 MG EC tablet Take by mouth.  . pantoprazole (PROTONIX) 40 MG tablet Take 1 tablet (40 mg total) by mouth 2 (two) times daily.  . rosuvastatin (CRESTOR) 20 MG tablet Take 1 tablet (20 mg total) by mouth daily.  . traMADol (ULTRAM) 50 MG tablet TAKE 1 TABLET BY MOUTH FOUR TIMES A DAY AS NEEDED   No facility-administered encounter medications on file as of 03/26/2020.    Current Diagnosis: Patient Active Problem List   Diagnosis Date Noted  . Impaired fasting glucose 04/10/2019  . Screening for prostate cancer 04/10/2019  . Essential hypertension, benign 04/04/2019  . Mixed hyperlipidemia 04/04/2019  . Cigarette nicotine dependence with nicotine-induced disorder 04/04/2019  . GERD without esophagitis 04/04/2019  . Cervical radicular pain 04/04/2019  . Panic attack 11/18/2016  . Long term current use of opiate analgesic 02/20/2012  . Pain syndrome, chronic 02/20/2012  . Tobacco use disorder 02/20/2012  . COPD with emphysema (HCC) 01/27/2012  . Other cervical disc degeneration, unspecified cervical region 01/27/2012    Have you seen any other providers since your last visit?  11/30/19-Psychiatry - start Alprazolam 1mg  10/05/19-PCP-patient stopped HCTZ  Patient left me a message on 03/26/20, to call him back, due to he doesn't answer numbers he doesn't  know.  I immediately returned his call.  He did not answer.  I left him a message that I was in the office til 4:30.  I tried to call again this morning due to his appointment is today with 03/28/20, CPP.  I left a message for him again  Follow-Up:  Pharmacist Review  Lucia Gaskins, CPP notified  Lucia Gaskins, [facility] Clinical Pharmacist Assistant [phone]

## 2020-03-27 ENCOUNTER — Other Ambulatory Visit: Payer: Self-pay

## 2020-03-27 ENCOUNTER — Ambulatory Visit (INDEPENDENT_AMBULATORY_CARE_PROVIDER_SITE_OTHER): Payer: Medicare Other

## 2020-03-27 DIAGNOSIS — F17219 Nicotine dependence, cigarettes, with unspecified nicotine-induced disorders: Secondary | ICD-10-CM

## 2020-03-27 DIAGNOSIS — E782 Mixed hyperlipidemia: Secondary | ICD-10-CM | POA: Diagnosis not present

## 2020-03-27 DIAGNOSIS — I10 Essential (primary) hypertension: Secondary | ICD-10-CM

## 2020-03-27 DIAGNOSIS — K219 Gastro-esophageal reflux disease without esophagitis: Secondary | ICD-10-CM

## 2020-03-27 NOTE — Patient Instructions (Addendum)
Visit Information  Thank you for your time discussing your medications. I look forward to working with you to achieve your health care goals. Below is a summary of what we talked about during our visit.   Goals Addressed            This Visit's Progress   . Learn More About My Health       Timeframe:  Long-Range Goal Priority:  High Start Date:                 03/27/2020            Expected End Date:   03/27/2021    Follow Up Date 09/20/2020    - ask questions - repeat what I heard to make sure I understand - bring a list of my medicines to the visit    Why is this important?    The best way to learn about your health and care is by talking to the doctor and nurse.   They will answer your questions and give you information in the way that you like best.    Notes:     . Lifestyle Change-Hypertension       Timeframe:  Long-Range Goal Priority:  High Start Date:         03/27/2020                    Expected End Date:           03/27/2021            Follow Up Date 09/20/2020    - agree to work together to make changes - learn about high blood pressure    Why is this important?    The changes that you are asked to make may be hard to do.   This is especially true when the changes are life-long.   Knowing why it is important to you is the first step.   Working on the change with your family or support person helps you not feel alone.   Reward yourself and family or support person when goals are met. This can be an activity you choose like bowling, hiking, biking, swimming or shooting hoops.     Notes:     Marland Kitchen Manage My Medicine       Timeframe:  Long-Range Goal Priority:  High Start Date:        03/27/2020                     Expected End Date:          02/15/20223             Follow Up Date 09/20/2020    - call for medicine refill 2 or 3 days before it runs out - keep a list of all the medicines I take; vitamins and herbals too - use a pillbox to sort  medicine    Why is this important?   . These steps will help you keep on track with your medicines.   Notes:        Patient Care Plan: CCM Pharmacy Care Plan    Problem Identified: hyperlipidemia, hypertension, anxiety, COPD, tobacco use   Priority: High  Onset Date: 03/27/2020    Long-Range Goal: Disease Management   Start Date: 03/27/2020  Expected End Date: 03/27/2021  This Visit's Progress: On track  Priority: High  Note:   Current Barriers:  . Unable to independently monitor  therapeutic efficacy  Pharmacist Clinical Goal(s):  Marland Kitchen Over the next 90 days, patient will achieve adherence to monitoring guidelines and medication adherence to achieve therapeutic efficacy through collaboration with PharmD and provider.    Interventions: . 1:1 collaboration with [name], MD regarding development and update of comprehensive plan of care as evidenced by provider attestation and co-signature . Inter-disciplinary care team collaboration (see longitudinal plan of care) . Comprehensive medication review performed; medication list updated in electronic medical record  Hypertension (BP goal <140/90) -controlled -Current treatment: . Amlodipine 10 mg daily  -Medications previously tried: none reported  -Current home readings: states it is staying "even". 110-130/72/80 -Current dietary habits: eats a lot of vegetables. Minimal meat.  -Current exercise habits: rides exercise bike at home.  -Denies hypotensive/hypertensive symptoms -Educated on BP goals and benefits of medications for prevention of heart attack, stroke and kidney damage; Daily salt intake goal < 2300 mg; Exercise goal of 150 minutes per week; Importance of home blood pressure monitoring; -Counseled to monitor BP at home weekly, document, and provide log at future appointments -Counseled on diet and exercise extensively Recommended to continue current medication  Hyperlipidemia: (LDL goal <  100) -controlled -Current treatment: . rosuvastatin 20 mg daily  . Aspirin 81 mg EC 2 tablets daily  -Medications previously tried: none reported  -Current dietary patterns: eats mostly vegetables. Reports small bag of popcorn at night for supper.   -Current exercise habits:  bought an exercise bike from his neighbor recently and working to improve stamins. Using bike several times a day and walking dog outdoors.  -Educated on Cholesterol goals;  Benefits of statin for ASCVD risk reduction; Importance of limiting foods high in cholesterol; Exercise goal of 150 minutes per week; -Counseled on diet and exercise extensively Recommended to continue current medication  COPD (Goal: control symptoms and prevent exacerbations) -controlled -Current treatment  . Albuterol 2 puffs every 6 hours prn wheezing or shortness of breath -Medications previously tried: none reported  -MMRC/CAT score: 2 -Pulmonary function testing: none available in chart -Exacerbations requiring treatment in last 6 months: patient denies -Patient denies consistent use of maintenance inhaler -Frequency of rescue inhaler use: uses weekly  -Counseled on Proper inhaler technique; -Counseled on diet and exercise extensively Recommended to continue current medication  Depression/Anxiety (Goal: manage anxiety) -controlled -Current treatment: . Alprazolam 1 mg bid  -Medications previously tried/failed: clonazepam -PHQ9: 0 -GAD7: 7 -Connected with psychiatry  for mental health support -Educated on Benefits of medication for symptom control Benefits of cognitive-behavioral therapy with or without medication -Recommended to continue current medication  Tobacco use (Goal reduce smoking) -uncontrolled -Current treatment  . None reported -Patient smokes Within 30 minutes of waking -Patient triggers include: drinking coffee -On a scale of 1-10, reports MOTIVATION to quit is 1 -On a scale of 1-10, reports CONFIDENCE in  quitting is 1 -Previous quit attempts: Chantix (suicide) -Provided contact information for [place] [facility]uit Line (1-800-QUIT-NOW) and encouraged patient to reach out to this group for support. -Counseled on slowly reducing amount of cigarettes daily  GERD (Goal: reduce symptoms of indigestion) -controlled -Current treatment  . Pantoprazole 40 mg bid  -Medications previously tried: none reported  -Counseled on diet and exercise extensively Counseled on small meals, elevating head of bed and avoiding tight waist bands.    Pain (Goal: manage pain) -controlled -Current treatment  . Tramadol 50 mg qid prn -Medications previously tried: none reported  -Recommended to continue current medication  Health Maintenance -Vaccine gaps: COVID/Flu - patient reports  doesn't take flu shots and declines COVID vaccine.  -Current therapy:  . Stool softener daily  . Vitamin D 1000 unit daily  . Vitamin C 1000 daily  -Educated on Cost vs benefit of each product must be carefully weighed by individual consumer -Patient is satisfied with current therapy and denies issues -Counseled on diet and exercise extensively Recommended to continue current medication   Patient Goals/Self-Care Activities . Over the next 90 days, patient will:  - take medications as prescribed focus on medication adherence by using pill box  Follow Up Plan: Telephone follow up appointment with care management team member scheduled for: 09/20/2020      Gregory Delgado was given information about Chronic Care Management services today including:  1. CCM service includes personalized support from designated clinical staff supervised by his physician, including individualized plan of care and coordination with other care providers 2. 24/7 contact phone numbers for assistance for urgent and routine care needs. 3. Standard insurance, coinsurance, copays and deductibles apply for chronic care management only during months in which we provide at  least 20 minutes of these services. Most insurances cover these services at 100%, however patients may be responsible for any copay, coinsurance and/or deductible if applicable. This service may help you avoid the need for more expensive face-to-face services. 4. Only one practitioner may furnish and bill the service in a calendar month. 5. The patient may stop CCM services at any time (effective at the end of the month) by phone call to the office staff.  Patient agreed to services and verbal consent obtained.   The patient verbalized understanding of instructions, educational materials, and care plan provided today and declined offer to receive copy of patient instructions, educational materials, and care plan.  Telephone follow up appointment with pharmacy team member scheduled for: 09/2020  Sherre Poot, PharmD Clinical Pharmacist [facility] [phone] (office) [phone] (mobile)     Steps to Quit Smoking Smoking tobacco is the leading cause of preventable death. It can affect almost every organ in the body. Smoking puts you and people around you at risk for many serious, long-lasting (chronic) diseases. Quitting smoking can be hard, but it is one of the best things that you can do for your health. It is never too late to quit. How do I get ready to quit? When you decide to quit smoking, make a plan to help you succeed. Before you quit:  Pick a date to quit. Set a date within the next 2 weeks to give you time to prepare.  Write down the reasons why you are quitting. Keep this list in places where you will see it often.  Tell your family, friends, and co-workers that you are quitting. Their support is important.  Talk with your doctor about the choices that may help you quit.  Find out if your health insurance will pay for these treatments.  Know the people, places, things, and activities that make you want to smoke (triggers). Avoid them. What first steps can I  take to quit smoking?  Throw away all cigarettes at home, at work, and in your car.  Throw away the things that you use when you smoke, such as ashtrays and lighters.  Clean your car. Make sure to empty the ashtray.  Clean your home, including curtains and carpets. What can I do to help me quit smoking? Talk with your doctor about taking medicines and seeing a counselor at the same time. You are more likely to  succeed when you do both.  If you are pregnant or breastfeeding, talk with your doctor about counseling or other ways to quit smoking. Do not take medicine to help you quit smoking unless your doctor tells you to do so. To quit smoking: Quit right away  Quit smoking totally, instead of slowly cutting back on how much you smoke over a period of time.  Go to counseling. You are more likely to quit if you go to counseling sessions regularly. Take medicine You may take medicines to help you quit. Some medicines need a prescription, and some you can buy over-the-counter. Some medicines may contain a drug called nicotine to replace the nicotine in cigarettes. Medicines may:  Help you to stop having the desire to smoke (cravings).  Help to stop the problems that come when you stop smoking (withdrawal symptoms). Your doctor may ask you to use:  Nicotine patches, gum, or lozenges.  Nicotine inhalers or sprays.  Non-nicotine medicine that is taken by mouth. Find resources Find resources and other ways to help you quit smoking and remain smoke-free after you quit. These resources are most helpful when you use them often. They include:  Online chats with a Social worker.  Phone quitlines.  Printed Furniture conservator/restorer.  Support groups or group counseling.  Text messaging programs.  Mobile phone apps. Use apps on your mobile phone or tablet that can help you stick to your quit plan. There are many free apps for mobile phones and tablets as well as websites. Examples include Quit  Guide from the [employer] and smokefree.gov   What things can I do to make it easier to quit?  Talk to your family and friends. Ask them to support and encourage you.  Call a phone quitline (1-800-QUIT-NOW), reach out to support groups, or work with a Social worker.  Ask people who smoke to not smoke around you.  Avoid places that make you want to smoke, such as: ? Bars. ? Parties. ? Smoke-break areas at work.  Spend time with people who do not smoke.  Lower the stress in your life. Stress can make you want to smoke. Try these things to help your stress: ? Getting regular exercise. ? Doing deep-breathing exercises. ? Doing yoga. ? Meditating. ? Doing a body scan. To do this, close your eyes, focus on one area of your body at a time from head to toe. Notice which parts of your body are tense. Try to relax the muscles in those areas.   How will I feel when I quit smoking? Day 1 to 3 weeks Within the first 24 hours, you may start to have some problems that come from quitting tobacco. These problems are very bad 2-3 days after you quit, but they do not often last for more than 2-3 weeks. You may get these symptoms:  Mood swings.  Feeling restless, nervous, angry, or annoyed.  Trouble concentrating.  Dizziness.  Strong desire for high-sugar foods and nicotine.  Weight gain.  Trouble pooping (constipation).  Feeling like you may vomit (nausea).  Coughing or a sore throat.  Changes in how the medicines that you take for other issues work in your body.  Depression.  Trouble sleeping (insomnia). Week 3 and afterward After the first 2-3 weeks of quitting, you may start to notice more positive results, such as:  Better sense of smell and taste.  Less coughing and sore throat.  Slower heart rate.  Lower blood pressure.  Clearer skin.  Better breathing.  Fewer sick  days. Quitting smoking can be hard. Do not give up if you fail the first time. Some people need to try a few  times before they succeed. Do your best to stick to your quit plan, and talk with your doctor if you have any questions or concerns. Summary  Smoking tobacco is the leading cause of preventable death. Quitting smoking can be hard, but it is one of the best things that you can do for your health.  When you decide to quit smoking, make a plan to help you succeed.  Quit smoking right away, not slowly over a period of time.  When you start quitting, seek help from your doctor, family, or friends. This information is not intended to replace advice given to you by your health care provider. Make sure you discuss any questions you have with your health care provider. Document Revised: 10/22/2018 Document Reviewed: 04/17/2018 Elsevier Patient Education  [place].

## 2020-03-27 NOTE — Progress Notes (Signed)
Chronic Care Management Pharmacy Note  03/27/2020 Name:  Gregory Delgado MRN:  [id number] DOB:  1950/02/22  Subjective: Gregory Delgado is an 70 y.o. year old male who is a primary patient of Cox, Kirsten, MD.  The CCM team was consulted for assistance with disease management and care coordination needs.    Engaged with patient by telephone for initial visit in response to provider referral for pharmacy case management and/or care coordination services.   Consent to Services:  The patient was given the following information about Chronic Care Management services today, agreed to services, and gave verbal consent: 1. CCM service includes personalized support from designated clinical staff supervised by the primary care provider, including individualized plan of care and coordination with other care providers 2. 24/7 contact phone numbers for assistance for urgent and routine care needs. 3. Service will only be billed when office clinical staff spend 20 minutes or more in a month to coordinate care. 4. Only one practitioner may furnish and bill the service in a calendar month. 5.The patient may stop CCM services at any time (effective at the end of the month) by phone call to the office staff. 6. The patient will be responsible for cost sharing (co-pay) of up to 20% of the service fee (after annual deductible is met). Patient agreed to services and consent obtained.  Patient Care Team: [name], MD as PCP - General (Family Medicine) Burnice Logan, [facility] as Pharmacist (Pharmacist)  Recent office visits: 10/05/2019 - defer depression management to psychiatry. Healthy diet and exercise encouraged. Recommend COVID 19 and Flu shot.  Recent consult visits: 02/24/2020 - Psychiatry visit.  11/30/2019 - Psychiatry - f/u 3 months.   Hospital visits: None in previous 6 months  Objective:  Lab Results  Component Value Date   CREATININE 1.13 10/05/2019   BUN 9 10/05/2019   GFRNONAA 67 10/05/2019    GFRAA 78 10/05/2019   NA 145 (H) 10/05/2019   K 4.1 10/05/2019   CALCIUM 9.1 10/05/2019   CO2 28 10/05/2019    Lab Results  Component Value Date/Time   HGBA1C 6.0 (H) 10/05/2019 11:47 AM   HGBA1C 5.9 (H) 04/05/2019 10:31 AM    Last diabetic Eye exam: No results found for: HMDIABEYEEXA  Last diabetic Foot exam: No results found for: HMDIABFOOTEX   Lab Results  Component Value Date   CHOL 133 10/05/2019   HDL 39 (L) 10/05/2019   LDLCALC 72 10/05/2019   TRIG 124 10/05/2019   CHOLHDL 3.4 10/05/2019    Hepatic Function Latest Ref Rng & Units 10/05/2019 07/04/2019 04/05/2019  Total Protein 6.0 - 8.5 g/dL 7.0 7.9 6.5  Albumin 3.8 - 4.8 g/dL 4.4 4.6 4.1  AST 0 - 40 IU/L $Remov'13 18 15  'EOcVKF$ ALT 0 - 44 IU/L 12 15 -  Alk Phosphatase 48 - 121 IU/L 103 121 98  Total Bilirubin 0.0 - 1.2 mg/dL 0.5 0.7 0.2    Lab Results  Component Value Date/Time   TSH 3.730 04/05/2019 10:34 AM    CBC Latest Ref Rng & Units 10/05/2019 07/04/2019 04/05/2019  WBC 3.4 - 10.8 x10E3/uL 10.1 12.2(H) CANCELED  Hemoglobin 13.0 - 17.7 g/dL 14.4 16.0 CANCELED  Hematocrit 37.5 - 51.0 % 42.3 45.2 CANCELED  Platelets 150 - 450 x10E3/uL 250 304 CANCELED    No results found for: VD25OH  Clinical ASCVD: No  The 10-year ASCVD risk score Mikey Bussing DC Jr., et al., 2013) is: 18.7%   Values used to calculate the score:  Age: 38 years     Sex: Male     Is Non-Hispanic African American: No     Diabetic: No     Tobacco smoker: Yes     Systolic Blood Pressure: 099 mmHg     Is BP treated: Yes     HDL Cholesterol: 39 mg/dL     Total Cholesterol: 133 mg/dL    Depression screen [facility] 2/9 03/27/2020 10/17/2019 10/05/2019  Decreased Interest 0 0 0  Down, Depressed, Hopeless 0 0 0  PHQ - 2 Score 0 0 0  Altered sleeping 0 1 -  Tired, decreased energy 0 0 -  Change in appetite 0 0 -  Feeling bad or failure about yourself  0 0 -  Trouble concentrating 0 0 -  Moving slowly or fidgety/restless 0 0 -  Suicidal thoughts 0 0 -  PHQ-9  Score 0 1 -    Social History   Tobacco Use  Smoking Status Current Every Day Smoker  . Packs/day: 0.50  . Types: Cigarettes  Smokeless Tobacco Never Used   BP Readings from Last 3 Encounters:  10/05/19 124/64  07/04/19 (!) 148/70  04/04/19 140/72   Pulse Readings from Last 3 Encounters:  10/05/19 74  07/04/19 100  04/04/19 (!) 104   Wt Readings from Last 3 Encounters:  10/05/19 181 lb 12.8 oz (82.5 kg)  07/04/19 173 lb 6.4 oz (78.7 kg)  04/04/19 176 lb 6.4 oz (80 kg)    Assessment/Interventions: Review of patient past medical history, allergies, medications, health status, including review of consultants reports, laboratory and other test data, was performed as part of comprehensive evaluation and provision of chronic care management services.   SDOH:  (Social Determinants of Health) assessments and interventions performed: Yes   CCM Care Plan  No Known Allergies  Medications Reviewed Today    Reviewed by [name], MD (Physician) on 10/17/19 at [place] List Status: <None>  Medication Order Taking? Sig Documenting Provider Last Dose Status Informant  albuterol (VENTOLIN HFA) 108 (90 Base) MCG/ACT inhaler [id number]  USE 2 INHALATIONS BY MOUTH  EVERY 6 HOURS AS NEEDED FOR WHEEZING OR SHORTNESS OF  BREATH Marge Duncans, PA-C  Active   ALPRAZolam Duanne Moron) 1 MG tablet [id number] No Take 1 mg by mouth 2 (two) times daily.  [provider] Taking Active   amLODipine (NORVASC) 10 MG tablet [id number]  Take 1 tablet (10 mg total) by mouth daily. [name], MD  Active   aspirin 81 MG EC tablet [id number] No Take by mouth. [provider] Taking Active   pantoprazole (PROTONIX) 40 MG tablet [id number]  Take 1 tablet (40 mg total) by mouth 2 (two) times daily. Cox, Kirsten, MD  Active   rosuvastatin (CRESTOR) 20 MG tablet [id number]  Take 1 tablet (20 mg total) by mouth daily. [name], MD  Active   traMADol Veatrice Bourbon) 50 MG tablet [id number]  TAKE 1 TABLET BY MOUTH  FOUR TIMES A DAY AS NEEDED Lillard Anes, MD  Active           Patient Active Problem List   Diagnosis Date Noted  . Impaired fasting glucose 04/10/2019  . Screening for prostate cancer 04/10/2019  . Essential hypertension, benign 04/04/2019  . Mixed hyperlipidemia 04/04/2019  . Cigarette nicotine dependence with nicotine-induced disorder 04/04/2019  . GERD without esophagitis 04/04/2019  . Cervical radicular pain 04/04/2019  . Panic attack 11/18/2016  . Long term current use of opiate analgesic 02/20/2012  . Pain syndrome,  chronic 02/20/2012  . Tobacco use disorder 02/20/2012  . 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 Other cervical disc degeneration, unspecified cervical region 01/27/2012     There is no immunization history on file for this patient.  Conditions to be addressed/monitored:  Hypertension, Hyperlipidemia, GERD, COPD, Anxiety, impaired fasting glucose and pain  Care Plan : CCM Pharmacy Care Plan  Updates made by Burnice Logan, RPH since 03/27/2020 12:00 AM    Problem: hyperlipidemia, hypertension, anxiety, COPD, tobacco use   Priority: High  Onset Date: 03/27/2020    Long-Range Goal: Disease Management   Start Date: 03/27/2020  Expected End Date: 03/27/2021  This Visit's Progress: On track  Priority: High  Note:   Current Barriers:  . Unable to independently monitor therapeutic efficacy  Pharmacist Clinical Goal(s):  Marland Kitchen Over the next 90 days, patient will achieve adherence to monitoring guidelines and medication adherence to achieve therapeutic efficacy through collaboration with PharmD and provider.    Interventions: . 1:1 collaboration with [name], MD regarding development and update of comprehensive plan of care as evidenced by provider attestation and co-signature . Inter-disciplinary care team collaboration (see longitudinal plan of care) . Comprehensive medication review performed; medication list updated in electronic medical  record  Hypertension (BP goal <140/90) -controlled -Current treatment: . Amlodipine 10 mg daily  -Medications previously tried: none reported  -Current home readings: states it is staying "even". 110-130/72/80 -Current dietary habits: eats a lot of vegetables. Minimal meat.  -Current exercise habits: rides exercise bike at home.  -Denies hypotensive/hypertensive symptoms -Educated on BP goals and benefits of medications for prevention of heart attack, stroke and kidney damage; Daily salt intake goal < 2300 mg; Exercise goal of 150 minutes per week; Importance of home blood pressure monitoring; -Counseled to monitor BP at home weekly, document, and provide log at future appointments -Counseled on diet and exercise extensively Recommended to continue current medication  Hyperlipidemia: (LDL goal < 100) -controlled -Current treatment: . rosuvastatin 20 mg daily  . Aspirin 81 mg EC 2 tablets daily  -Medications previously tried: none reported  -Current dietary patterns: eats mostly vegetables. Reports small bag of popcorn at night for supper.   -Current exercise habits:  bought an exercise bike from his neighbor recently and working to improve stamins. Using bike several times a day and walking dog outdoors.  -Educated on Cholesterol goals;  Benefits of statin for ASCVD risk reduction; Importance of limiting foods high in cholesterol; Exercise goal of 150 minutes per week; -Counseled on diet and exercise extensively Recommended to continue current medication  COPD (Goal: control symptoms and prevent exacerbations) -controlled -Current treatment  . Albuterol 2 puffs every 6 hours prn wheezing or shortness of breath -Medications previously tried: none reported  -MMRC/CAT score: 2 -Pulmonary function testing: none available in chart -Exacerbations requiring treatment in last 6 months: patient denies -Patient denies consistent use of maintenance inhaler -Frequency of rescue inhaler  use: uses weekly  -Counseled on Proper inhaler technique; -Counseled on diet and exercise extensively Recommended to continue current medication  Depression/Anxiety (Goal: manage anxiety) -controlled -Current treatment: . Alprazolam 1 mg bid  -Medications previously tried/failed: clonazepam -PHQ9: 0 -GAD7: 7 -Connected with psychiatry  for mental health support -Educated on Benefits of medication for symptom control Benefits of cognitive-behavioral therapy with or without medication -Recommended to continue current medication  Tobacco use (Goal reduce smoking) -uncontrolled -Current treatment  . None reported -Patient smokes Within 30 minutes of waking -Patient triggers include: drinking coffee -On a  scale of 1-10, reports MOTIVATION to quit is 1 -On a scale of 1-10, reports CONFIDENCE in quitting is 1 -Previous quit attempts: Chantix (suicide) -Provided contact information for [place] Quit Line (1-800-QUIT-NOW) and encouraged patient to reach out to this group for support. -Counseled on slowly reducing amount of cigarettes daily  GERD (Goal: reduce symptoms of indigestion) -controlled -Current treatment  . Pantoprazole 40 mg bid  -Medications previously tried: none reported  -Counseled on diet and exercise extensively Counseled on small meals, elevating head of bed and avoiding tight waist bands.    Pain (Goal: manage pain) -controlled -Current treatment  . Tramadol 50 mg qid prn -Medications previously tried: none reported  -Recommended to continue current medication  Health Maintenance -Vaccine gaps: COVID/Flu - patient reports doesn't take flu shots and declines COVID vaccine.  -Current therapy:  . Stool softener daily  . Vitamin D 1000 unit daily  . Vitamin C 1000 daily  -Educated on Cost vs benefit of each product must be carefully weighed by individual consumer -Patient is satisfied with current therapy and denies issues -Counseled on diet and exercise  extensively Recommended to continue current medication   Patient Goals/Self-Care Activities . Over the next 90 days, patient will:  - take medications as prescribed focus on medication adherence by using pill box  Follow Up Plan: Telephone follow up appointment with care management team member scheduled for: 09/20/2020      Medication Assistance: None required.  Patient affirms current coverage meets needs.  Patient's preferred pharmacy is:  [facility], [address], [address], [place] [address] Phone: [phone] Fax: [phone]  [id number] - [place], [address] [place] [address] Phone: [phone] Fax: [phone]  Uses pill box? Yes Pt endorses good compliance  We discussed: Current pharmacy is preferred with insurance plan and patient is satisfied with pharmacy services Patient decided to: Continue current medication management strategy  Care Plan and Follow Up Patient Decision:  Patient agrees to Care Plan and Follow-up.  Plan: Telephone follow up appointment with care management team member scheduled for:  6 months

## 2020-04-03 ENCOUNTER — Telehealth: Payer: Self-pay

## 2020-04-03 ENCOUNTER — Encounter: Payer: Self-pay | Admitting: Nurse Practitioner

## 2020-04-03 ENCOUNTER — Telehealth (INDEPENDENT_AMBULATORY_CARE_PROVIDER_SITE_OTHER): Payer: Medicare Other | Admitting: Nurse Practitioner

## 2020-04-03 VITALS — BP 142/62 | HR 99 | Temp 97.3°F | Ht 68.0 in | Wt 188.0 lb

## 2020-04-03 DIAGNOSIS — R590 Localized enlarged lymph nodes: Secondary | ICD-10-CM | POA: Diagnosis not present

## 2020-04-03 DIAGNOSIS — F17200 Nicotine dependence, unspecified, uncomplicated: Secondary | ICD-10-CM | POA: Diagnosis not present

## 2020-04-03 DIAGNOSIS — F17219 Nicotine dependence, cigarettes, with unspecified nicotine-induced disorders: Secondary | ICD-10-CM

## 2020-04-03 DIAGNOSIS — H60311 Diffuse otitis externa, right ear: Secondary | ICD-10-CM | POA: Diagnosis not present

## 2020-04-03 DIAGNOSIS — H9201 Otalgia, right ear: Secondary | ICD-10-CM

## 2020-04-03 LAB — POCT INFLUENZA A/B
Influenza A, POC: NEGATIVE
Influenza B, POC: NEGATIVE

## 2020-04-03 LAB — POC COVID19 BINAXNOW: SARS Coronavirus 2 Ag: NEGATIVE

## 2020-04-03 MED ORDER — AMOXICILLIN-POT CLAVULANATE 875-125 MG PO TABS: 1.0000 | ORAL_TABLET | Freq: Two times a day (BID) | ORAL | 0 refills | Status: DC

## 2020-04-03 NOTE — Patient Instructions (Signed)
Return March 3rd, 2022 with Dr Sedalia Muta Do not put Q-tips or peroxide in right ear Take all antibiotics as prescribed  Otitis Externa  Otitis externa is an infection of the outer ear canal. The outer ear canal is the area between the outside of the ear and the eardrum. Otitis externa is sometimes called swimmer's ear. What are the causes? Common causes of this condition include:  Swimming in dirty water.  Moisture in the ear.  An injury to the inside of the ear.  An object stuck in the ear.  A cut or scrape on the outside of the ear. What increases the risk? You are more likely to get this condition if you go swimming often. What are the signs or symptoms?  Itching in the ear. This is often the first symptom.  Swelling of the ear.  Redness in the ear.  Ear pain. The pain may get worse when you pull on your ear.  Pus coming from the ear. How is this treated? This condition may be treated with:  Antibiotic ear drops. These are often given for 10-14 days.  Medicines to reduce itching and swelling. Follow these instructions at home:  If you were given antibiotic ear drops, use them as told by your doctor. Do not stop using them even if your condition gets better.  Take over-the-counter and prescription medicines only as told by your doctor.  Avoid getting water in your ears as told by your doctor. You may be told to avoid swimming or water sports for a few days.  Keep all follow-up visits as told by your doctor. This is important. How is this prevented?  Keep your ears dry. Use the corner of a towel to dry your ears after you swim or bathe.  Try not to scratch or put things in your ear. Doing these things makes it easier for germs to grow in your ear.  Avoid swimming in lakes, dirty water, or pools that may not have the right amount of a chemical called chlorine. Contact a doctor if:  You have a fever.  Your ear is still red, swollen, or painful after 3 days.  You  still have pus coming from your ear after 3 days.  Your redness, swelling, or pain gets worse.  You have a really bad headache.  You have redness, swelling, pain, or tenderness behind your ear. Summary  Otitis externa is an infection of the outer ear canal.  Symptoms include pain, redness, and swelling of the ear.  If you were given antibiotic ear drops, use them as told by your doctor. Do not stop using them even if your condition gets better.  Try not to scratch or put things in your ear. This information is not intended to replace advice given to you by your health care provider. Make sure you discuss any questions you have with your health care provider. Document Revised: 07/03/2017 Document Reviewed: 07/03/2017 Elsevier Patient Education  2021 Elsevier Inc. Amoxicillin; Clavulanic Acid Tablets What is this medicine? AMOXICILLIN; CLAVULANIC ACID (a mox i SIL in; KLAV yoo lan ic AS id) is a penicillin antibiotic. It treats some infections caused by bacteria. It will not work for colds, the flu, or other viruses. This medicine may be used for other purposes; ask your health care provider or pharmacist if you have questions. COMMON BRAND NAME(S): Augmentin What should I tell my health care provider before I take this medicine? They need to know if you have any of these conditions:  kidney disease  liver disease  mononucleosis  stomach or intestine problems such as colitis  an unusual or allergic reaction to amoxicillin, other penicillin or cephalosporin antibiotics, clavulanic acid, other medicines, foods, dyes, or preservatives  pregnant or trying to get pregnant  breast-feeding How should I use this medicine? Take this drug by mouth. Take it as directed on the prescription label at the same time every day. Take it with food at the start of a meal or snack. Take all of this drug unless your health care provider tells you to stop it early. Keep taking it even if you think  you are better. Talk to your health care provider about the use of this drug in children. While it may be prescribed for selected conditions, precautions do apply. Overdosage: If you think you have taken too much of this medicine contact a poison control center or emergency room at once. NOTE: This medicine is only for you. Do not share this medicine with others. What if I miss a dose? If you miss a dose, take it as soon as you can. If it is almost time for your next dose, take only that dose. Do not take double or extra doses. What may interact with this medicine?  allopurinol  anticoagulants  birth control pills  methotrexate  probenecid This list may not describe all possible interactions. Give your health care provider a list of all the medicines, herbs, non-prescription drugs, or dietary supplements you use. Also tell them if you smoke, drink alcohol, or use illegal drugs. Some items may interact with your medicine. What should I watch for while using this medicine? Tell your health care provider if your symptoms do not start to get better or if they get worse. This medicine may cause serious skin reactions. They can happen weeks to months after starting the medicine. Contact your health care provider right away if you notice fevers or flu-like symptoms with a rash. The rash may be red or purple and then turn into blisters or peeling of the skin. Or, you might notice a red rash with swelling of the face, lips or lymph nodes in your neck or under your arms. Do not treat diarrhea with over the counter products. Contact your health care provider if you have diarrhea that lasts more than 2 days or if it is severe and watery. If you have diabetes, you may get a false-positive result for sugar in your urine. Check with your health care provider. Birth control may not work properly while you are taking this medicine. Talk to your health care provider about using an extra method of birth  control. What side effects may I notice from receiving this medicine? Side effects that you should report to your doctor or health care provider as soon as possible:  allergic reactions (skin rash, itching or hives; swelling of the face, lips, or tongue)  bloody or watery diarrhea  dark urine  infection (fever, chills, cough, sore throat, or pain)  kidney injury (trouble passing urine or change in the amount of urine)  redness, blistering, peeling, or loosening of the skin, including inside the mouth  seizures  thrush (white patches in the mouth or mouth sores)  trouble breathing  unusual bruising or bleeding  unusually weak or tired Side effects that usually do not require medical attention (report to your doctor or health care provider if they continue or are bothersome):  diarrhea  dizziness  headache  nausea, vomiting  unusual vaginal discharge, itching,  or odor  upset stomach This list may not describe all possible side effects. Call your doctor for medical advice about side effects. You may report side effects to FDA at 1-800-FDA-1088. Where should I keep my medicine? Keep out of the reach of children and pets. Store at room temperature between 20 and 25 degrees C (68 and 77 degrees F). Throw away any unused drug after the expiration date. NOTE: This sheet is a summary. It may not cover all possible information. If you have questions about this medicine, talk to your doctor, pharmacist, or health care provider.  2021 Elsevier/Gold Standard (2019-12-21 09:45:03)

## 2020-04-03 NOTE — Telephone Encounter (Signed)
Pt left message stating he is having ear problems.  Called pt back. Pt states he is having ear pain with dizziness and knot under ear. He is supposed to see "ear doctor" in March but doesn't think he can wait that long. Pt states he has had a headache this morning. Also complaining of SOB.   Lorita Officer, [place] 04/03/20 3:02 PM

## 2020-04-03 NOTE — Progress Notes (Signed)
Acute Office Visit  Subjective:    Patient ID: Gregory Delgado, male    DOB: April 26, 1950, 70 y.o.   MRN: [id number]  Chief Complaint  Patient presents with  . Ear Pain    Right ear    HPI Patient is in today for right ear otalgia, drainage, and neck discomfort. Onset of symptoms was 2 months ago. Pt states he has treated right ear symptoms with peroxide and inserting Q-tips in ear. States symptoms improved initially but have returned. Describes pain as throbbing and tender to touch. He tells me that he has experienced chronic otitis media since childhood and chronic tinnitus for several years. Left ear tinnitus greater than right. He is scheduled to see ENT on 04/23/20.  Past Medical History:  Diagnosis Date  . COPD (chronic obstructive pulmonary disease) (HCC)   . GERD (gastroesophageal reflux disease)   . Hyperlipidemia   . Hypertension   . Osteoarthritis     Past Surgical History:  Procedure Laterality Date  . CERVICAL DISC SURGERY  2015   C2-C4   . KNEE SURGERY  1976    Family History  Problem Relation Age of Onset  . Diabetes Mother   . Alcoholism Father   . CVA Sister   . Congenital heart disease Daughter   . Rectal cancer Son   . Cirrhosis Son     Social History   Socioeconomic History  . Marital status: Single    Spouse name: Not on file  . Number of children: Not on file  . Years of education: Not on file  . Highest education level: Not on file  Occupational History  . Not on file  Tobacco Use  . Smoking status: Current Every Day Smoker    Packs/day: 0.50    Types: Cigarettes  . Smokeless tobacco: Never Used  Substance and Sexual Activity  . Alcohol use: Never  . Drug use: Never  . Sexual activity: Not on file  Other Topics Concern  . Not on file  Social History Narrative  . Not on file   Social Determinants of Health   Financial Resource Strain: Not on file  Food Insecurity: No Food Insecurity  . Worried About Programme researcher, broadcasting/film/video in the  Last Year: Never true  . Ran Out of Food in the Last Year: Never true  Transportation Needs: Not on file  Physical Activity: Not on file  Stress: Not on file  Social Connections: Not on file  Intimate Partner Violence: Not on file    Outpatient Medications Prior to Visit  Medication Sig Dispense Refill  . albuterol (VENTOLIN HFA) 108 (90 Base) MCG/ACT inhaler USE 2 INHALATIONS BY MOUTH  EVERY 6 HOURS AS NEEDED FOR WHEEZING OR SHORTNESS OF  BREATH 34 g 3  . ALPRAZolam (XANAX) 1 MG tablet Take 1 mg by mouth 2 (two) times daily.     Marland Kitchen amLODipine (NORVASC) 10 MG tablet Take 1 tablet (10 mg total) by mouth daily. 90 tablet 3  . Ascorbic Acid (VITAMIN C) 1000 MG tablet Take 1,000 mg by mouth daily.    Marland Kitchen aspirin 81 MG EC tablet Take 162 mg by mouth daily at 10 pm.    . cholecalciferol (VITAMIN D3) 25 MCG (1000 UNIT) tablet Take 1,000 Units by mouth daily.    Marland Kitchen docusate sodium (COLACE) 100 MG capsule Take 100 mg by mouth daily.    . pantoprazole (PROTONIX) 40 MG tablet Take 1 tablet (40 mg total) by mouth 2 (two) times daily.  180 tablet 3  . rosuvastatin (CRESTOR) 20 MG tablet Take 1 tablet (20 mg total) by mouth daily. 90 tablet 3  . traMADol (ULTRAM) 50 MG tablet TAKE 1 TABLET BY MOUTH FOUR TIMES A DAY AS NEEDED 120 tablet 1   No facility-administered medications prior to visit.    No Known Allergies  Review of Systems  Constitutional: Negative for fatigue and fever.  HENT: Positive for ear discharge (right ear) and ear pain (Right ear). Negative for congestion, sinus pressure and sore throat.   Eyes: Negative for pain.  Respiratory: Negative for cough, chest tightness, shortness of breath and wheezing.   Cardiovascular: Negative for chest pain and palpitations.  Gastrointestinal: Negative for abdominal pain, constipation, diarrhea, nausea and vomiting.  Genitourinary: Negative for dysuria and hematuria.  Musculoskeletal: Positive for neck pain (right neck below ear). Negative for  arthralgias, back pain, joint swelling, myalgias and neck stiffness.  Skin: Negative for rash.  Neurological: Positive for dizziness and headaches. Negative for weakness.  Hematological: Positive for adenopathy.  Psychiatric/Behavioral: Negative for dysphoric mood. The patient is not nervous/anxious.        Objective:    Physical Exam Vitals reviewed.  Constitutional:      Appearance: Normal appearance.  HENT:     Head: Normocephalic.     Right Ear: Swelling and tenderness present. No mastoid tenderness.     Left Ear: Tympanic membrane, ear canal and external ear normal.     Nose: Nose normal.     Mouth/Throat:     Mouth: Mucous membranes are moist.  Eyes:     Pupils: Pupils are equal, round, and reactive to light.  Cardiovascular:     Rate and Rhythm: Normal rate and regular rhythm.     Pulses: Normal pulses.     Heart sounds: Normal heart sounds.  Pulmonary:     Breath sounds: Normal breath sounds.  Abdominal:     General: Abdomen is flat. Bowel sounds are normal.     Palpations: Abdomen is soft.  Musculoskeletal:        General: Normal range of motion.     Cervical back: Tenderness present.  Lymphadenopathy:     Cervical: Cervical adenopathy (right tonsilar lymphnode enlarged,tender) present.  Skin:    General: Skin is warm and dry.  Neurological:     Mental Status: He is alert and oriented to person, place, and time.  Psychiatric:        Mood and Affect: Mood normal.        Behavior: Behavior normal.      Wt Readings from Last 3 Encounters:  10/05/19 181 lb 12.8 oz (82.5 kg)  07/04/19 173 lb 6.4 oz (78.7 kg)  04/04/19 176 lb 6.4 oz (80 kg)    Health Maintenance Due  Topic Date Due  . Hepatitis C Screening  Never done  . COVID-19 Vaccine (1) Never done  . TETANUS/TDAP  Never done  . COLONOSCOPY (Pts 45-35yrs Insurance coverage will need to be confirmed)  Never done  . PNA vac Low Risk Adult (1 of 2 - PCV13) Never done  . INFLUENZA VACCINE  Never done        Lab Results  Component Value Date   TSH 3.730 04/05/2019   Lab Results  Component Value Date   WBC 10.1 10/05/2019   HGB 14.4 10/05/2019   HCT 42.3 10/05/2019   MCV 93 10/05/2019   PLT 250 10/05/2019   Lab Results  Component Value Date   NA 145 (  H) 10/05/2019   K 4.1 10/05/2019   CO2 28 10/05/2019   GLUCOSE 106 (H) 10/05/2019   BUN 9 10/05/2019   CREATININE 1.13 10/05/2019   BILITOT 0.5 10/05/2019   ALKPHOS 103 10/05/2019   AST 13 10/05/2019   ALT 12 10/05/2019   PROT 7.0 10/05/2019   ALBUMIN 4.4 10/05/2019   CALCIUM 9.1 10/05/2019   Lab Results  Component Value Date   CHOL 133 10/05/2019   Lab Results  Component Value Date   HDL 39 (L) 10/05/2019   Lab Results  Component Value Date   LDLCALC 72 10/05/2019   Lab Results  Component Value Date   TRIG 124 10/05/2019   Lab Results  Component Value Date   CHOLHDL 3.4 10/05/2019   Lab Results  Component Value Date   HGBA1C 6.0 (H) 10/05/2019         Assessment & Plan:     1. 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  Dispense: 20 tablet; Refill: 0 - CBC with Differential/Platelet  2. Cervical adenopathy - Influenza A/B - POC COVID-19  3. Acute otalgia, right - Influenza A/B - POC COVID-19  4. Cigarette nicotine dependence with nicotine-induced disorder -Smoking cessation encouraged  5. Tobacco use disorder  Return March 3rd, 2022 with Dr Sedalia Muta Do not put Q-tips or peroxide in right ear Take all antibiotics as prescribed   Follow-up: March 3rd, 2022   Signed,  Flonnie Hailstone, DNP

## 2020-04-04 LAB — CBC WITH DIFFERENTIAL/PLATELET
Basophils Absolute: 0.1 10*3/uL (ref 0.0–0.2)
Basos: 1 %
EOS (ABSOLUTE): 0.2 10*3/uL (ref 0.0–0.4)
Eos: 2 %
Hematocrit: 39.5 % (ref 37.5–51.0)
Hemoglobin: 13.6 g/dL (ref 13.0–17.7)
Immature Grans (Abs): 0 10*3/uL (ref 0.0–0.1)
Immature Granulocytes: 0 %
Lymphocytes Absolute: 2.9 10*3/uL (ref 0.7–3.1)
Lymphs: 28 %
MCH: 31.3 pg (ref 26.6–33.0)
MCHC: 34.4 g/dL (ref 31.5–35.7)
MCV: 91 fL (ref 79–97)
Monocytes Absolute: 0.7 10*3/uL (ref 0.1–0.9)
Monocytes: 7 %
Neutrophils Absolute: 6.5 10*3/uL (ref 1.4–7.0)
Neutrophils: 62 %
Platelets: 252 10*3/uL (ref 150–450)
RBC: 4.34 x10E6/uL (ref 4.14–5.80)
RDW: 13.1 % (ref 11.6–15.4)
WBC: 10.3 10*3/uL (ref 3.4–10.8)

## 2020-04-05 ENCOUNTER — Telehealth: Payer: Self-pay

## 2020-04-05 NOTE — Progress Notes (Signed)
    Chronic Care Management Pharmacy Assistant   Name: Gregory Delgado  MRN: [id number] DOB: Jul 05, 1950  Reason for Encounter: Adherence  PCP : [name], MD  Allergies:  No Known Allergies  Medications: Outpatient Encounter Medications as of 04/05/2020  Medication Sig  . albuterol (VENTOLIN HFA) 108 (90 Base) MCG/ACT inhaler USE 2 INHALATIONS BY MOUTH  EVERY 6 HOURS AS NEEDED FOR WHEEZING OR SHORTNESS OF  BREATH  . ALPRAZolam (XANAX) 1 MG tablet Take 1 mg by mouth 2 (two) times daily.   Marland Kitchen amLODipine (NORVASC) 10 MG tablet Take 1 tablet (10 mg total) by mouth daily.  Marland Kitchen amoxicillin-clavulanate (AUGMENTIN) 875-125 MG tablet Take 1 tablet by mouth 2 (two) times daily.  . Ascorbic Acid (VITAMIN C) 1000 MG tablet Take 1,000 mg by mouth daily.  Marland Kitchen aspirin 81 MG EC tablet Take 162 mg by mouth daily at 10 pm.  . cholecalciferol (VITAMIN D3) 25 MCG (1000 UNIT) tablet Take 1,000 Units by mouth daily.  Marland Kitchen docusate sodium (COLACE) 100 MG capsule Take 100 mg by mouth daily.  . pantoprazole (PROTONIX) 40 MG tablet Take 1 tablet (40 mg total) by mouth 2 (two) times daily.  . rosuvastatin (CRESTOR) 20 MG tablet Take 1 tablet (20 mg total) by mouth daily.  . traMADol (ULTRAM) 50 MG tablet TAKE 1 TABLET BY MOUTH FOUR TIMES A DAY AS NEEDED   No facility-administered encounter medications on file as of 04/05/2020.    Current Diagnosis: Patient Active Problem List   Diagnosis Date Noted  . Impaired fasting glucose 04/10/2019  . Screening for prostate cancer 04/10/2019  . Essential hypertension, benign 04/04/2019  . Mixed hyperlipidemia 04/04/2019  . Cigarette nicotine dependence with nicotine-induced disorder 04/04/2019  . GERD without esophagitis 04/04/2019  . Cervical radicular pain 04/04/2019  . Panic attack 11/18/2016  . Long term current use of opiate analgesic 02/20/2012  . Pain syndrome, chronic 02/20/2012  . Tobacco use disorder 02/20/2012  . COPD with emphysema ([place]) 01/27/2012  .  Other cervical disc degeneration, unspecified cervical region 01/27/2012    Chart review noted that on 04/03/20, patient had a appointment with PCP, the following change was made to his medication: amoxicillin-clavulanate (AUGMENTIN) 875-125 MG tablet, for ear pain.  Adherence gap Total Gaps - Star Measures 0 ? Colorectal Cancer Screening (COL) Met: Colorectal Cancer Screening Compliant ? Medication Adherence for Cholesterol (Statins) (MAC) Met: Med Compliance CHOL:  100% ? Medication Adherence for Diabetes Medications (MAD) 0 ? Medication Adherence for Hypertension (RAS antogoniststs) [facility]) 0 ? Medication Reconciliation Post-Discharge (MRP) 0 ? Osteoporosis Management in Women Who Had a Fracture (OMW) 0 ? Plan All-Cause Readmissions (PCR) 0 ? Statin Therapy for Patients with Cardiovascular Disease (SPC) 0 ? Statin Use in Persons with Diabetes (SUPD) 0 ? Statin Use in Persons with Diabetes (SUPD) 0 Visits: Wellness Bundle [facility]) Not Met: AWV not performed Visits: Annual Wellness Visit (AWV) Not Met: AWV not performed Controlling High Blood Pressure (CBP) Met: BP < 140/90  Follow-Up:  Pharmacist Review  Donette Larry, [place], [place] Pharmacist Assistant [phone]

## 2020-04-09 ENCOUNTER — Other Ambulatory Visit: Payer: Self-pay | Admitting: Nurse Practitioner

## 2020-04-09 ENCOUNTER — Telehealth: Payer: Self-pay

## 2020-04-09 ENCOUNTER — Telehealth: Payer: Self-pay | Admitting: Nurse Practitioner

## 2020-04-09 MED ORDER — AZITHROMYCIN 250 MG PO TABS: ORAL_TABLET | ORAL | 0 refills | Status: DC

## 2020-04-09 NOTE — Telephone Encounter (Signed)
Pt left message stating he had reaction to antibiotic.   Returned call. Pt states he had diarrhea, nausea/vomitting, and itching. Pt states he has been itching since Friday. Only took 3 days of antibiotic. Advised to take benadryl and will ask Dr. for new antibiotic.   Pharmacy: [facility] [address].   Lorita Officer, CCMA 04/09/20 10:08 AM

## 2020-04-09 NOTE — Telephone Encounter (Signed)
Pt had side effects of Augmentin antibiotic. Pt told to stop Augmentin. [facility]ak sent to [facility] in [place]. Pt instructed to begin Z-pak today

## 2020-04-11 NOTE — Progress Notes (Signed)
Subjective:  Patient ID: Gregory Delgado, male    DOB: 06/06/1950  Age: 70 y.o. MRN: [id number]  Chief Complaint  Patient presents with  . Hypertension  . Urinary Tract Infection    HPI Essential hypertension, benign Taking norvasc. Well controlled.   Other emphysema (HCC) Has ventolin inhaler 1-2 times per week.   GERD without esophagitis Controlled with protonix.  Mixed hyperlipidemia Takes crestor daily. Tries to eat healthy.   GAD: on xanax 1 mg tiwce a day. Sees psychiatry.  Left shoulder pain: Tramadol one four times a day prn.   Current Outpatient Medications on File Prior to Visit  Medication Sig Dispense Refill  . ALPRAZolam (XANAX) 1 MG tablet Take 1 mg by mouth 2 (two) times daily.     Marland Kitchen amLODipine (NORVASC) 10 MG tablet Take 1 tablet (10 mg total) by mouth daily. 90 tablet 3  . Ascorbic Acid (VITAMIN C) 1000 MG tablet Take 1,000 mg by mouth daily.    Marland Kitchen aspirin 81 MG EC tablet Take 162 mg by mouth daily at 10 pm.    . cholecalciferol (VITAMIN D3) 25 MCG (1000 UNIT) tablet Take 1,000 Units by mouth daily.    Marland Kitchen docusate sodium (COLACE) 100 MG capsule Take 100 mg by mouth daily.    . pantoprazole (PROTONIX) 40 MG tablet Take 1 tablet (40 mg total) by mouth 2 (two) times daily. 180 tablet 3  . rosuvastatin (CRESTOR) 20 MG tablet Take 1 tablet (20 mg total) by mouth daily. 90 tablet 3   No current facility-administered medications on file prior to visit.   Past Medical History:  Diagnosis Date  . COPD (chronic obstructive pulmonary disease) (HCC)   . GERD (gastroesophageal reflux disease)   . Hyperlipidemia   . Hypertension   . Osteoarthritis    Past Surgical History:  Procedure Laterality Date  . CERVICAL DISC SURGERY  2015   C2-C4   . KNEE SURGERY  1976    Family History  Problem Relation Age of Onset  . Diabetes Mother   . Alcoholism Father   . CVA Sister   . Congenital heart disease Daughter   . Rectal cancer Son   . Cirrhosis Son    Social  History   Socioeconomic History  . Marital status: Single    Spouse name: Not on file  . Number of children: Not on file  . Years of education: Not on file  . Highest education level: Not on file  Occupational History  . Not on file  Tobacco Use  . Smoking status: Current Every Day Smoker    Packs/day: 0.50    Types: Cigarettes  . Smokeless tobacco: Never Used  Substance and Sexual Activity  . Alcohol use: Never  . Drug use: Never  . Sexual activity: Not on file  Other Topics Concern  . Not on file  Social History Narrative  . Not on file   Social Determinants of Health   Financial Resource Strain: Not on file  Food Insecurity: No Food Insecurity  . Worried About Programme researcher, broadcasting/film/video in the Last Year: Never true  . Ran Out of Food in the Last Year: Never true  Transportation Needs: Not on file  Physical Activity: Not on file  Stress: Not on file  Social Connections: Not on file    Review of Systems  Constitutional: Negative for chills, diaphoresis, fatigue and fever.  HENT: Positive for ear pain. Negative for congestion and sore throat.   Respiratory: Negative for  cough and shortness of breath.   Cardiovascular: Negative for chest pain and leg swelling.  Gastrointestinal: Positive for diarrhea, nausea and vomiting. Negative for abdominal pain and constipation.       Sick over the past weekend.   Genitourinary: Negative for dysuria and urgency.  Musculoskeletal: Positive for arthralgias and back pain. Negative for myalgias.  Neurological: Positive for weakness. Negative for dizziness and headaches.  Psychiatric/Behavioral: Negative for dysphoric mood.     Objective:  BP 118/80   Pulse 79   Temp (!) 96.8 F (36 C)   Ht 5\' 8"  (1.727 m)   Wt 183 lb (83 kg)   SpO2 98%   BMI 27.83 kg/m   BP/Weight 04/12/2020 04/03/2020 10/05/2019  Systolic BP 118 142 124  Diastolic BP 80 62 64  Wt. (Lbs) 183 188 181.8  BMI 27.83 28.59 27.64    Physical Exam Vitals reviewed.   Constitutional:      Appearance: Normal appearance. He is normal weight.  HENT:     Right Ear: External ear normal.     Left Ear: External ear normal.  Neck:     Vascular: No carotid bruit.  Cardiovascular:     Rate and Rhythm: Normal rate and regular rhythm.     Heart sounds: No murmur heard.   Pulmonary:     Effort: Pulmonary effort is normal.     Breath sounds: Normal breath sounds.  Abdominal:     General: Abdomen is flat. Bowel sounds are normal.     Palpations: Abdomen is soft.     Tenderness: There is no abdominal tenderness.  Musculoskeletal:        General: Normal range of motion.     Comments: Of shoulders BL although showed some discomfort with full abduction.  Neurological:     Mental Status: He is alert and oriented to person, place, and time.  Psychiatric:        Mood and Affect: Mood normal.        Behavior: Behavior normal.     Lab Results  Component Value Date   WBC 10.3 04/03/2020   HGB 13.6 04/03/2020   HCT 39.5 04/03/2020   PLT 252 04/03/2020   GLUCOSE 106 (H) 10/05/2019   CHOL 133 10/05/2019   TRIG 124 10/05/2019   HDL 39 (L) 10/05/2019   LDLCALC 72 10/05/2019   ALT 12 10/05/2019   AST 13 10/05/2019   NA 145 (H) 10/05/2019   K 4.1 10/05/2019   CL 102 10/05/2019   CREATININE 1.13 10/05/2019   BUN 9 10/05/2019   CO2 28 10/05/2019   TSH 3.730 04/05/2019   HGBA1C 6.0 (H) 10/05/2019      Assessment & Plan:   1. Essential hypertension, benign Well controlled.  No changes to medicines.  Continue to work on eating a healthy diet and exercise.  Labs drawn today.  - Comprehensive metabolic panel - CBC with Differential/Platelet  2. Other emphysema (HCC) Continue albuterol HFA 2 puffs qid prn wheezing.   3. GERD without esophagitis The current medical regimen is effective;  continue present plan and medications.  4. Mixed hyperlipidemia Well controlled.  No changes to medicines.  Continue to work on eating a healthy diet and  exercise.  Labs drawn today.  - Lipid panel  5. Screening for colon cancer - Ambulatory referral to Gastroenterology  6. Need for hepatitis C screening test - HCV Ab w Reflex to Quant PCR  7. Primary osteoarthritis of both shoulders - traMADol (ULTRAM) 50  MG tablet; TAKE 1 TABLET BY MOUTH FOUR TIMES A DAY AS NEEDED  Dispense: 120 tablet; Refill: 2    Meds ordered this encounter  Medications  . albuterol (VENTOLIN HFA) 108 (90 Base) MCG/ACT inhaler    Sig: USE 2 INHALATIONS BY MOUTH  EVERY 6 HOURS AS NEEDED FOR WHEEZING OR SHORTNESS OF  BREATH    Dispense:  34 g    Refill:  3    Requesting 1 year supply  . traMADol (ULTRAM) 50 MG tablet    Sig: TAKE 1 TABLET BY MOUTH FOUR TIMES A DAY AS NEEDED    Dispense:  120 tablet    Refill:  2    Not to exceed 4 additional fills before 07/11/2020 DX Code Needed  NEED REFILLS PLEASE.    Orders Placed This Encounter  Procedures  . Lipid panel  . Comprehensive metabolic panel  . CBC with Differential/Platelet  . HCV Ab w Reflex to Quant PCR  . Ambulatory referral to Gastroenterology    Follow-up: Return in about 3 months (around 07/13/2020) for not fasting.  An After Visit Summary was printed and given to the patient.  Blane Ohara, MD [facility] [phone]

## 2020-04-12 ENCOUNTER — Encounter: Payer: Self-pay | Admitting: Family Medicine

## 2020-04-12 ENCOUNTER — Ambulatory Visit (INDEPENDENT_AMBULATORY_CARE_PROVIDER_SITE_OTHER): Payer: Medicare Other | Admitting: Family Medicine

## 2020-04-12 ENCOUNTER — Other Ambulatory Visit: Payer: Self-pay

## 2020-04-12 VITALS — BP 118/80 | HR 79 | Temp 96.8°F | Ht 68.0 in | Wt 183.0 lb

## 2020-04-12 DIAGNOSIS — I10 Essential (primary) hypertension: Secondary | ICD-10-CM

## 2020-04-12 DIAGNOSIS — E782 Mixed hyperlipidemia: Secondary | ICD-10-CM

## 2020-04-12 DIAGNOSIS — Z1211 Encounter for screening for malignant neoplasm of colon: Secondary | ICD-10-CM

## 2020-04-12 DIAGNOSIS — M19012 Primary osteoarthritis, left shoulder: Secondary | ICD-10-CM

## 2020-04-12 DIAGNOSIS — R7301 Impaired fasting glucose: Secondary | ICD-10-CM | POA: Diagnosis not present

## 2020-04-12 DIAGNOSIS — Z1159 Encounter for screening for other viral diseases: Secondary | ICD-10-CM

## 2020-04-12 DIAGNOSIS — M19011 Primary osteoarthritis, right shoulder: Secondary | ICD-10-CM | POA: Diagnosis not present

## 2020-04-12 DIAGNOSIS — J438 Other emphysema: Secondary | ICD-10-CM

## 2020-04-12 DIAGNOSIS — K219 Gastro-esophageal reflux disease without esophagitis: Secondary | ICD-10-CM | POA: Diagnosis not present

## 2020-04-12 MED ORDER — ALBUTEROL SULFATE HFA 108 (90 BASE) MCG/ACT IN AERS: INHALATION_SPRAY | RESPIRATORY_TRACT | 3 refills | Status: DC

## 2020-04-12 MED ORDER — TRAMADOL HCL 50 MG PO TABS: ORAL_TABLET | ORAL | 2 refills | Status: DC

## 2020-04-13 LAB — LIPID PANEL
Chol/HDL Ratio: 3.5 ratio (ref 0.0–5.0)
Cholesterol, Total: 115 mg/dL (ref 100–199)
HDL: 33 mg/dL — ABNORMAL LOW (ref >39)
LDL Chol Calc (NIH): 62 mg/dL (ref 0–99)
Triglycerides: 104 mg/dL (ref 0–149)
VLDL Cholesterol Cal: 20 mg/dL (ref 5–40)

## 2020-04-13 LAB — CBC WITH DIFFERENTIAL/PLATELET
Basophils Absolute: 0.1 10*3/uL (ref 0.0–0.2)
Basos: 1 %
EOS (ABSOLUTE): 0.2 10*3/uL (ref 0.0–0.4)
Eos: 2 %
Hematocrit: 42.5 % (ref 37.5–51.0)
Hemoglobin: 14.4 g/dL (ref 13.0–17.7)
Immature Grans (Abs): 0 10*3/uL (ref 0.0–0.1)
Immature Granulocytes: 0 %
Lymphocytes Absolute: 2.2 10*3/uL (ref 0.7–3.1)
Lymphs: 22 %
MCH: 32.2 pg (ref 26.6–33.0)
MCHC: 33.9 g/dL (ref 31.5–35.7)
MCV: 95 fL (ref 79–97)
Monocytes Absolute: 0.7 10*3/uL (ref 0.1–0.9)
Monocytes: 7 %
Neutrophils Absolute: 6.8 10*3/uL (ref 1.4–7.0)
Neutrophils: 68 %
Platelets: 260 10*3/uL (ref 150–450)
RBC: 4.47 x10E6/uL (ref 4.14–5.80)
RDW: 13.1 % (ref 11.6–15.4)
WBC: 10 10*3/uL (ref 3.4–10.8)

## 2020-04-13 LAB — COMPREHENSIVE METABOLIC PANEL
ALT: 16 IU/L (ref 0–44)
AST: 18 IU/L (ref 0–40)
Albumin/Globulin Ratio: 1.6 (ref 1.2–2.2)
Albumin: 4.4 g/dL (ref 3.8–4.8)
Alkaline Phosphatase: 106 IU/L (ref 44–121)
BUN/Creatinine Ratio: 8 — ABNORMAL LOW (ref 10–24)
BUN: 9 mg/dL (ref 8–27)
Bilirubin Total: 0.4 mg/dL (ref 0.0–1.2)
CO2: 24 mmol/L (ref 20–29)
Calcium: 9.1 mg/dL (ref 8.6–10.2)
Chloride: 102 mmol/L (ref 96–106)
Creatinine, Ser: 1.17 mg/dL (ref 0.76–1.27)
Globulin, Total: 2.8 g/dL (ref 1.5–4.5)
Glucose: 120 mg/dL — ABNORMAL HIGH (ref 65–99)
Potassium: 3.8 mmol/L (ref 3.5–5.2)
Sodium: 143 mmol/L (ref 134–144)
Total Protein: 7.2 g/dL (ref 6.0–8.5)
eGFR: 68 mL/min/{1.73_m2} (ref >59)

## 2020-04-13 LAB — CARDIOVASCULAR RISK ASSESSMENT

## 2020-04-13 LAB — HCV AB W REFLEX TO QUANT PCR: HCV Ab: <0.1 s/co ratio (ref 0.0–0.9)

## 2020-04-13 LAB — HCV INTERPRETATION

## 2020-04-18 LAB — HGB A1C W/O EAG: Hgb A1c MFr Bld: 5.8 % — ABNORMAL HIGH (ref 4.8–5.6)

## 2020-04-18 LAB — SPECIMEN STATUS REPORT

## 2020-04-19 ENCOUNTER — Other Ambulatory Visit: Payer: Self-pay

## 2020-04-19 MED ORDER — ALBUTEROL SULFATE HFA 108 (90 BASE) MCG/ACT IN AERS: 2.0000 | INHALATION_SPRAY | Freq: Four times a day (QID) | RESPIRATORY_TRACT | 2 refills | Status: DC | PRN

## 2020-04-23 DIAGNOSIS — H9319 Tinnitus, unspecified ear: Secondary | ICD-10-CM | POA: Diagnosis not present

## 2020-04-23 DIAGNOSIS — H6093 Unspecified otitis externa, bilateral: Secondary | ICD-10-CM | POA: Diagnosis not present

## 2020-04-23 DIAGNOSIS — H9190 Unspecified hearing loss, unspecified ear: Secondary | ICD-10-CM | POA: Diagnosis not present

## 2020-04-23 DIAGNOSIS — H6123 Impacted cerumen, bilateral: Secondary | ICD-10-CM | POA: Diagnosis not present

## 2020-04-23 DIAGNOSIS — H9203 Otalgia, bilateral: Secondary | ICD-10-CM | POA: Diagnosis not present

## 2020-04-30 DIAGNOSIS — H61303 Acquired stenosis of external ear canal, unspecified, bilateral: Secondary | ICD-10-CM | POA: Diagnosis not present

## 2020-04-30 DIAGNOSIS — H6093 Unspecified otitis externa, bilateral: Secondary | ICD-10-CM | POA: Diagnosis not present

## 2020-05-01 ENCOUNTER — Other Ambulatory Visit: Payer: Self-pay | Admitting: Family Medicine

## 2020-05-08 DIAGNOSIS — H61303 Acquired stenosis of external ear canal, unspecified, bilateral: Secondary | ICD-10-CM | POA: Diagnosis not present

## 2020-05-08 DIAGNOSIS — Z8669 Personal history of other diseases of the nervous system and sense organs: Secondary | ICD-10-CM | POA: Diagnosis not present

## 2020-05-08 DIAGNOSIS — Z789 Other specified health status: Secondary | ICD-10-CM | POA: Diagnosis not present

## 2020-05-31 ENCOUNTER — Other Ambulatory Visit: Payer: Self-pay | Admitting: Family Medicine

## 2020-05-31 DIAGNOSIS — I10 Essential (primary) hypertension: Secondary | ICD-10-CM

## 2020-05-31 DIAGNOSIS — K219 Gastro-esophageal reflux disease without esophagitis: Secondary | ICD-10-CM

## 2020-05-31 DIAGNOSIS — E782 Mixed hyperlipidemia: Secondary | ICD-10-CM

## 2020-06-14 ENCOUNTER — Telehealth: Payer: Self-pay

## 2020-06-14 NOTE — Progress Notes (Signed)
Chronic Care Management Pharmacy Assistant   Name: Gregory Delgado  MRN: [id number] DOB: 08/01/1950   Reason for Encounter: Disease State for hypertension  Recent office visits:  05/17/20-behavior health, anxiety disorder, Alprazolam reduced to twice a day,    04/12/20-Dr. Cox PCP, HTN, Labs, Screening for colon cancer ambulatory referral to Gastroenterology, traMADol (ULTRAM) 50 MG tablet, A1c 5.8. This is consistent with prediabetes. Recommend patient limit sugar in his diet.Cholesterol well controlled. Sugar elevated to 120. Recommend add A1C. Liver and kidney function normal. Blood count normal.  Negative for hepatitis c.    Recent consult visits:  none  Hospital visits:  None in previous 6 months  Medications: Outpatient Encounter Medications as of 06/14/2020  Medication Sig  . albuterol (PROVENTIL HFA) 108 (90 Base) MCG/ACT inhaler Inhale 2 puffs into the lungs every 6 (six) hours as needed for wheezing or shortness of breath.  . ALPRAZolam (XANAX) 1 MG tablet Take 1 mg by mouth 2 (two) times daily.   Marland Kitchen amLODipine (NORVASC) 10 MG tablet TAKE 1 TABLET BY MOUTH  DAILY  . Ascorbic Acid (VITAMIN C) 1000 MG tablet Take 1,000 mg by mouth daily.  Marland Kitchen aspirin 81 MG EC tablet Take 162 mg by mouth daily at 10 pm.  . cholecalciferol (VITAMIN D3) 25 MCG (1000 UNIT) tablet Take 1,000 Units by mouth daily.  Marland Kitchen docusate sodium (COLACE) 100 MG capsule Take 100 mg by mouth daily.  . pantoprazole (PROTONIX) 40 MG tablet TAKE 1 TABLET BY MOUTH  TWICE DAILY  . rosuvastatin (CRESTOR) 20 MG tablet TAKE 1 TABLET BY MOUTH  DAILY  . traMADol (ULTRAM) 50 MG tablet TAKE 1 TABLET BY MOUTH FOUR TIMES A DAY AS NEEDED   No facility-administered encounter medications on file as of 06/14/2020.     Recent Office Vitals: BP Readings from Last 3 Encounters:  04/12/20 118/80  04/03/20 (!) 142/62  10/05/19 124/64   Pulse Readings from Last 3 Encounters:  04/12/20 79  04/03/20 99  10/05/19 74    Wt  Readings from Last 3 Encounters:  04/12/20 183 lb (83 kg)  04/03/20 188 lb (85.3 kg)  10/05/19 181 lb 12.8 oz (82.5 kg)     Kidney Function Lab Results  Component Value Date/Time   CREATININE 1.17 04/12/2020 11:38 AM   CREATININE 1.13 10/05/2019 11:47 AM   GFRNONAA 67 10/05/2019 11:47 AM   GFRAA 78 10/05/2019 11:47 AM    BMP Latest Ref Rng & Units 04/12/2020 10/05/2019 07/04/2019  Glucose 65 - 99 mg/dL 119(J) 478(G) 956(O)  BUN 8 - 27 mg/dL 9 9 13   Creatinine 0.76 - 1.27 mg/dL 1.30 8.65)  BUN/Creat Ratio 10 - 24 8(L) 8(L) 10  Sodium 134 - 144 mmol/L 143 145(H) 142  Potassium 3.5 - 5.2 mmol/L 3.8 4.1 4.3  Chloride 96 - 106 mmol/L 102 102 100  CO2 20 - 29 mmol/L 24 28 24   Calcium 8.6 - 10.2 mg/dL 9.1 9.1 9.9     . Current antihypertensive regimen:  Norvasc  Patient verbally confirms he is taking the above medications as directed. No, patient stated he stopped this medication about 3 weeks ago, it was dropping his blood pressures to low, patient stated one day it was 98/60.  He states he gets tired when it is to low.   . How often are you checking your Blood Pressure? twice daily   . Current home BP readings: Patient stated he checks it 2-3 times per day   . DATE:  BP               PULSE  06/14/20  127/73   . Wrist or arm cuff: Patient states he uses and arm cuff . Caffeine intake: Patient has 2 cups of coffee in the morning, he has no soda and drinks flavored water. . Salt intake:  Patient adds no salt to his food  . Any readings above 180/120? No   . What recent interventions/DTPs have been made by any provider to improve Blood Pressure control since last CPP Visit:  Patient stopped his medication 3 weeks ago due to making his blood pressure drop to low. He stated he feels better since stopping it.   . Any recent hospitalizations or ED visits since last visit with CPP? No  . What diet changes have been made to improve Blood Pressure Control?  Patient  has stopped drinking soda, he drinks several bottles of water per day.   . What exercise is being done to improve your Blood Pressure Control?  Patient stated he is walking and uses exercise bike, he knows his blood sugar was elevated at last visit and he is trying to get that back down.   Adherence Review: Is the patient currently on ACE/ARB medication? No Does the patient have >5 day gap between last estimated fill dates? CPP to review   Star Rating Drugs:  Medication:  Last Fill: Day Supply Rosuvastatin  04/16/20  90 Amlodipine  04/16/20  90  Leilani Able, [place] Clinical Pharmacist Assistant [phone]

## 2020-06-21 ENCOUNTER — Other Ambulatory Visit: Payer: Self-pay | Admitting: Family Medicine

## 2020-07-16 ENCOUNTER — Encounter: Payer: Self-pay | Admitting: Family Medicine

## 2020-07-16 ENCOUNTER — Ambulatory Visit (INDEPENDENT_AMBULATORY_CARE_PROVIDER_SITE_OTHER): Payer: Medicare Other | Admitting: Family Medicine

## 2020-07-16 ENCOUNTER — Other Ambulatory Visit: Payer: Self-pay

## 2020-07-16 VITALS — BP 138/76 | HR 96 | Temp 97.2°F | Resp 18 | Ht 68.0 in | Wt 181.4 lb

## 2020-07-16 DIAGNOSIS — G8929 Other chronic pain: Secondary | ICD-10-CM | POA: Diagnosis not present

## 2020-07-16 DIAGNOSIS — J438 Other emphysema: Secondary | ICD-10-CM

## 2020-07-16 DIAGNOSIS — M25512 Pain in left shoulder: Secondary | ICD-10-CM

## 2020-07-16 DIAGNOSIS — F330 Major depressive disorder, recurrent, mild: Secondary | ICD-10-CM

## 2020-07-16 DIAGNOSIS — K219 Gastro-esophageal reflux disease without esophagitis: Secondary | ICD-10-CM | POA: Diagnosis not present

## 2020-07-16 DIAGNOSIS — G894 Chronic pain syndrome: Secondary | ICD-10-CM | POA: Diagnosis not present

## 2020-07-16 DIAGNOSIS — R7301 Impaired fasting glucose: Secondary | ICD-10-CM

## 2020-07-16 DIAGNOSIS — F17219 Nicotine dependence, cigarettes, with unspecified nicotine-induced disorders: Secondary | ICD-10-CM

## 2020-07-16 DIAGNOSIS — E782 Mixed hyperlipidemia: Secondary | ICD-10-CM

## 2020-07-16 DIAGNOSIS — I10 Essential (primary) hypertension: Secondary | ICD-10-CM

## 2020-07-16 NOTE — Progress Notes (Signed)
Subjective:  Patient ID: Gregory Delgado, male    DOB: Aug 27, 1950  Age: 70 y.o. MRN: [id number]  Chief Complaint  Patient presents with  . Hypertension  . Hyperlipidemia    HPI Hypertension: pt stopped amlodipine about 3 months ago because he was feeling dizzy. His bp was 98/58. His bps over the last 3 days have been 106-125/68-79.  Left Shoulder pain:  Takes tramadol 50 mg 2 in am and 2 at night. No other meds for shoulder.   Hyperlipidemia: on crestor 20 mg once daily. Eating healthy. Exercising: stationary bike.  Feels like shaking inside. Not visible to others.   Current Outpatient Medications on File Prior to Visit  Medication Sig Dispense Refill  . albuterol (VENTOLIN HFA) 108 (90 Base) MCG/ACT inhaler USE 2 PUFFS INTO THE LUNGS  EVERY 6 HOURS AS NEEDED FOR WHEEZING OR SHORTNESS OF  BREATH 26.8 g 3  . ALPRAZolam (XANAX) 1 MG tablet Take 1 mg by mouth 2 (two) times daily.     . Ascorbic Acid (VITAMIN C) 1000 MG tablet Take 1,000 mg by mouth daily.    Marland Kitchen aspirin 81 MG EC tablet Take 162 mg by mouth daily at 10 pm.    . cholecalciferol (VITAMIN D3) 25 MCG (1000 UNIT) tablet Take 1,000 Units by mouth daily.    Marland Kitchen docusate sodium (COLACE) 100 MG capsule Take 100 mg by mouth daily.    . pantoprazole (PROTONIX) 40 MG tablet TAKE 1 TABLET BY MOUTH  TWICE DAILY 180 tablet 0  . rosuvastatin (CRESTOR) 20 MG tablet TAKE 1 TABLET BY MOUTH  DAILY 90 tablet 0  . traMADol (ULTRAM) 50 MG tablet TAKE 1 TABLET BY MOUTH FOUR TIMES A DAY AS NEEDED 120 tablet 2   No current facility-administered medications on file prior to visit.   Past Medical History:  Diagnosis Date  . COPD (chronic obstructive pulmonary disease) (HCC)   . GERD (gastroesophageal reflux disease)   . Hyperlipidemia   . Hypertension   . Osteoarthritis    Past Surgical History:  Procedure Laterality Date  . CERVICAL DISC SURGERY  2015   C2-C4   . KNEE SURGERY  1976    Family History  Problem Relation Age of Onset  .  Diabetes Mother   . Alcoholism Father   . CVA Sister   . Congenital heart disease Daughter   . Rectal cancer Son   . Cirrhosis Son    Social History   Socioeconomic History  . Marital status: Single    Spouse name: Not on file  . Number of children: Not on file  . Years of education: Not on file  . Highest education level: Not on file  Occupational History  . Not on file  Tobacco Use  . Smoking status: Current Every Day Smoker    Packs/day: 0.75    Years: 55.00    Pack years: 41.25    Types: Cigarettes  . Smokeless tobacco: Never Used  Substance and Sexual Activity  . Alcohol use: Never  . Drug use: Never  . Sexual activity: Not on file  Other Topics Concern  . Not on file  Social History Narrative  . Not on file   Social Determinants of Health   Financial Resource Strain: Not on file  Food Insecurity: No Food Insecurity  . Worried About Programme researcher, broadcasting/film/video in the Last Year: Never true  . Ran Out of Food in the Last Year: Never true  Transportation Needs: Not on file  Physical Activity: Not on file  Stress: Not on file  Social Connections: Not on file    Review of Systems  Constitutional: Negative for chills and fever.  HENT: Positive for ear discharge and rhinorrhea. Negative for congestion and sore throat.   Eyes: Positive for visual disturbance.  Respiratory: Negative for cough and shortness of breath.   Cardiovascular: Negative for chest pain and palpitations.  Gastrointestinal: Positive for constipation. Negative for abdominal pain, diarrhea, nausea and vomiting.  Genitourinary: Negative for dysuria and urgency.  Musculoskeletal: Positive for back pain. Negative for arthralgias and myalgias.  Neurological: Positive for weakness and light-headedness. Negative for dizziness and headaches.  Psychiatric/Behavioral: Negative for dysphoric mood. The patient is nervous/anxious.      Objective:  BP 138/76   Pulse 96   Temp (!) 97.2 F (36.2 C)   Resp 18    Ht 5\' 8"  (1.727 m)   Wt 181 lb 6.4 oz (82.3 kg)   BMI 27.58 kg/m   BP/Weight 07/16/2020 04/12/2020 04/03/2020  Systolic BP 138 118 142  Diastolic BP 76 80 62  Wt. (Lbs) 181.4 183 188  BMI 27.58 27.83 28.59    Physical Exam Vitals reviewed.  Constitutional:      Appearance: Normal appearance.  Neck:     Vascular: No carotid bruit.  Cardiovascular:     Rate and Rhythm: Normal rate and regular rhythm.     Pulses: Normal pulses.     Heart sounds: Normal heart sounds.  Pulmonary:     Effort: Pulmonary effort is normal.     Breath sounds: Normal breath sounds. No wheezing, rhonchi or rales.  Abdominal:     General: Bowel sounds are normal.     Palpations: Abdomen is soft.     Tenderness: There is no abdominal tenderness.  Musculoskeletal:        General: Tenderness (anterior left shoulder) present.  Neurological:     Mental Status: He is alert and oriented to person, place, and time.     Cranial Nerves: No cranial nerve deficit.     Coordination: Coordination normal.     Gait: Gait normal.     Deep Tendon Reflexes: Reflexes normal.     Comments: Negative dysmetria. Negative pronator drift.   Psychiatric:        Mood and Affect: Mood normal.        Behavior: Behavior normal.     Diabetic Foot Exam - Simple   No data filed      Lab Results  Component Value Date   WBC 10.0 04/12/2020   HGB 14.4 04/12/2020   HCT 42.5 04/12/2020   PLT 260 04/12/2020   GLUCOSE 120 (H) 04/12/2020   CHOL 115 04/12/2020   TRIG 104 04/12/2020   HDL 33 (L) 04/12/2020   LDLCALC 62 04/12/2020   ALT 16 04/12/2020   AST 18 04/12/2020   NA 143 04/12/2020   K 3.8 04/12/2020   CL 102 04/12/2020   CREATININE 1.17 04/12/2020   BUN 9 04/12/2020   CO2 24 04/12/2020   TSH 3.730 04/05/2019   HGBA1C 5.8 (H) 04/12/2020      Assessment & Plan:   1. Essential hypertension, benign Managed with diet and exercise. Remain off amlodipine.  2. Other emphysema (HCC) Currently on no inhalers.   Recommend quit smoking.   3. Mixed hyperlipidemia Well controlled.  No changes to medicines.  Continue to work on eating a healthy diet and exercise.  Labs drawn today.  - CBC with Differential/Platelet -  Comprehensive metabolic panel - Lipid panel  4. GERD without esophagitis The current medical regimen is effective;  continue present plan and medications.  5. Cigarette nicotine dependence with nicotine-induced disorder Recommend cessation.  6. Mild recurrent major depression (HCC) Management per specialist.   7. Impaired fasting glucose - Hemoglobin A1c  8. Chronic left shoulder pain Continue tramadol 50 mg 2 twice a day.   9. Chronic pain syndrome: Last took tramadol this morning. Pain contract signed today.  - Pain Mgt Scrn (14 Drugs), Ur    Orders Placed This Encounter  Procedures  . CBC with Differential/Platelet  . Comprehensive metabolic panel  . Hemoglobin A1c  . Lipid panel  . Pain Mgt Scrn (14 Drugs), Ur     Follow-up: Return in about 3 months (around 10/16/2020) for fasting.  An After Visit Summary was printed and given to the patient.  Blane Ohara, MD [facility] [phone]

## 2020-07-17 LAB — CBC WITH DIFFERENTIAL/PLATELET
Basophils Absolute: 0.1 10*3/uL (ref 0.0–0.2)
Basos: 1 %
EOS (ABSOLUTE): 0.1 10*3/uL (ref 0.0–0.4)
Eos: 1 %
Hematocrit: 42.2 % (ref 37.5–51.0)
Hemoglobin: 14.9 g/dL (ref 13.0–17.7)
Immature Grans (Abs): 0 10*3/uL (ref 0.0–0.1)
Immature Granulocytes: 0 %
Lymphocytes Absolute: 2.1 10*3/uL (ref 0.7–3.1)
Lymphs: 20 %
MCH: 32.5 pg (ref 26.6–33.0)
MCHC: 35.3 g/dL (ref 31.5–35.7)
MCV: 92 fL (ref 79–97)
Monocytes Absolute: 0.5 10*3/uL (ref 0.1–0.9)
Monocytes: 5 %
Neutrophils Absolute: 7.7 10*3/uL — ABNORMAL HIGH (ref 1.4–7.0)
Neutrophils: 73 %
Platelets: 238 10*3/uL (ref 150–450)
RBC: 4.59 x10E6/uL (ref 4.14–5.80)
RDW: 13.1 % (ref 11.6–15.4)
WBC: 10.5 10*3/uL (ref 3.4–10.8)

## 2020-07-17 LAB — LIPID PANEL
Chol/HDL Ratio: 3.3 ratio (ref 0.0–5.0)
Cholesterol, Total: 129 mg/dL (ref 100–199)
HDL: 39 mg/dL — ABNORMAL LOW (ref >39)
LDL Chol Calc (NIH): 70 mg/dL (ref 0–99)
Triglycerides: 107 mg/dL (ref 0–149)
VLDL Cholesterol Cal: 20 mg/dL (ref 5–40)

## 2020-07-17 LAB — HEMOGLOBIN A1C
Est. average glucose Bld gHb Est-mCnc: 126 mg/dL
Hgb A1c MFr Bld: 6 % — ABNORMAL HIGH (ref 4.8–5.6)

## 2020-07-17 LAB — COMPREHENSIVE METABOLIC PANEL
ALT: 17 IU/L (ref 0–44)
AST: 15 IU/L (ref 0–40)
Albumin/Globulin Ratio: 1.7 (ref 1.2–2.2)
Albumin: 4.5 g/dL (ref 3.8–4.8)
Alkaline Phosphatase: 96 IU/L (ref 44–121)
BUN/Creatinine Ratio: 10 (ref 10–24)
BUN: 13 mg/dL (ref 8–27)
Bilirubin Total: 0.4 mg/dL (ref 0.0–1.2)
CO2: 26 mmol/L (ref 20–29)
Calcium: 9.3 mg/dL (ref 8.6–10.2)
Chloride: 101 mmol/L (ref 96–106)
Creatinine, Ser: 1.27 mg/dL (ref 0.76–1.27)
Globulin, Total: 2.6 g/dL (ref 1.5–4.5)
Glucose: 104 mg/dL — ABNORMAL HIGH (ref 65–99)
Potassium: 4.7 mmol/L (ref 3.5–5.2)
Sodium: 140 mmol/L (ref 134–144)
Total Protein: 7.1 g/dL (ref 6.0–8.5)
eGFR: 62 mL/min/{1.73_m2} (ref >59)

## 2020-07-17 LAB — CARDIOVASCULAR RISK ASSESSMENT

## 2020-07-18 ENCOUNTER — Encounter: Payer: Self-pay | Admitting: Family Medicine

## 2020-07-18 LAB — PAIN MGT SCRN (14 DRUGS), UR
Amphetamine Scrn, Ur: NEGATIVE ng/mL
BARBITURATE SCREEN URINE: NEGATIVE ng/mL
BENZODIAZEPINE SCREEN, URINE: POSITIVE ng/mL — AB
Buprenorphine, Urine: NEGATIVE ng/mL
CANNABINOIDS UR QL SCN: POSITIVE ng/mL — AB
Cocaine (Metab) Scrn, Ur: NEGATIVE ng/mL
Creatinine(Crt), U: 88.3 mg/dL (ref 20.0–300.0)
Fentanyl, Urine: NEGATIVE pg/mL
Meperidine Screen, Urine: NEGATIVE ng/mL
Methadone Screen, Urine: NEGATIVE ng/mL
OXYCODONE+OXYMORPHONE UR QL SCN: NEGATIVE ng/mL
Opiate Scrn, Ur: NEGATIVE ng/mL
Ph of Urine: 7.9 (ref 4.5–8.9)
Phencyclidine Qn, Ur: NEGATIVE ng/mL
Propoxyphene Scrn, Ur: NEGATIVE ng/mL
Tramadol Screen, Urine: POSITIVE ng/mL — AB

## 2020-07-24 ENCOUNTER — Telehealth: Payer: Self-pay

## 2020-07-24 NOTE — Progress Notes (Signed)
    Chronic Care Management Pharmacy Assistant   Name: Gregory Delgado  MRN: [id number] DOB: 05-Sep-1950   Reason for Encounter: Disease State for general adherence  Recent office visits:  07/16/20-Dr Cox PCP, Labs, pain management screen, patient not taking Amlodipine.   His bp was 98/58. His bps over the last 3 days have been 106-125/68-79. UDS: c/w xanax and tramadol. Pt also has cannibus in his system. He needs to discontinue THC by his next visit. It is not adviseable to use thc while onthese medicines.   05/17/20-behavior health, anxiety disorder, Alprazolam reduced to twice a day,     04/12/20-Dr. Cox PCP, HTN, Labs, Screening for colon cancer ambulatory referral to Gastroenterology, traMADol (ULTRAM) 50 MG tablet, A1c 5.8.  This is consistent with prediabetes.  Recommend patient limit sugar in his diet.Cholesterol well controlled. Sugar elevated to 120. Recommend add A1C. Liver and kidney function normal. Blood count normal. Negative for hepatitis c.   Recent consult visits:  none  Hospital visits:  None in previous 6 months  Medications: Outpatient Encounter Medications as of 07/24/2020  Medication Sig   albuterol (VENTOLIN HFA) 108 (90 Base) MCG/ACT inhaler USE 2 PUFFS INTO THE LUNGS  EVERY 6 HOURS AS NEEDED FOR WHEEZING OR SHORTNESS OF  BREATH   ALPRAZolam (XANAX) 1 MG tablet Take 1 mg by mouth 2 (two) times daily.    Ascorbic Acid (VITAMIN C) 1000 MG tablet Take 1,000 mg by mouth daily.   aspirin 81 MG EC tablet Take 162 mg by mouth daily at 10 pm.   cholecalciferol (VITAMIN D3) 25 MCG (1000 UNIT) tablet Take 1,000 Units by mouth daily.   docusate sodium (COLACE) 100 MG capsule Take 100 mg by mouth daily.   pantoprazole (PROTONIX) 40 MG tablet TAKE 1 TABLET BY MOUTH  TWICE DAILY   rosuvastatin (CRESTOR) 20 MG tablet TAKE 1 TABLET BY MOUTH  DAILY   traMADol (ULTRAM) 50 MG tablet TAKE 1 TABLET BY MOUTH FOUR TIMES A DAY AS NEEDED   No facility-administered encounter medications  on file as of 07/24/2020.    Patient stated he is doing well.  He stated he checks his blood pressure 1-2 times per day.  Patient said that today his blood pressure was 124/75 Yesterday morning it was 122/79, later in day it was 122/76.  Patient denies any hypertension or hypotension symptoms.  He  said he monitors it closely.  Patient watches his diet and salt intake.  Patient stays active, he walks the dog and has stationary bike, he does not go outside when it is to hot, he said he cant take the heat.   Patient denies any issues with his other medications.    Patient said his Tramadol would run out around the 27th of June, I told him to contact the pharmacy and they would contact the office to get a refill request.    Care Gaps: AWV-none listed  Star Rating Drugs: Rosuvastatin  06/18/20  90   Leilani Able, [place] Clinical Pharmacist Assistant [phone]

## 2020-08-06 ENCOUNTER — Other Ambulatory Visit: Payer: Self-pay | Admitting: Family Medicine

## 2020-08-06 DIAGNOSIS — M19011 Primary osteoarthritis, right shoulder: Secondary | ICD-10-CM

## 2020-08-06 DIAGNOSIS — M19012 Primary osteoarthritis, left shoulder: Secondary | ICD-10-CM

## 2020-08-19 ENCOUNTER — Other Ambulatory Visit: Payer: Self-pay | Admitting: Physician Assistant

## 2020-08-19 DIAGNOSIS — I10 Essential (primary) hypertension: Secondary | ICD-10-CM

## 2020-08-19 DIAGNOSIS — E782 Mixed hyperlipidemia: Secondary | ICD-10-CM

## 2020-08-19 DIAGNOSIS — K219 Gastro-esophageal reflux disease without esophagitis: Secondary | ICD-10-CM

## 2020-08-28 ENCOUNTER — Telehealth: Payer: Self-pay

## 2020-08-28 NOTE — Progress Notes (Signed)
    Chronic Care Management Pharmacy Assistant   Name: Gregory Delgado  MRN: [id number] DOB: [birth year]  Reason for Encounter: Disease State for lipids    Recent office visits:  07/16/20-Dr Cox PCP, Labs, pain management screen, patient not taking Amlodipine.   His bp was 98/58. His bps over the last 3 days have been 106-125/68-79. UDS: c/w xanax and tramadol. Pt also has cannibus in his system. He needs to discontinue THC by his next visit. It is not adviseable to use thc while onthese medicines.   Recent consult visits:  none  Hospital visits:  None in previous 6 months  Medications: Outpatient Encounter Medications as of 08/28/2020  Medication Sig   albuterol (VENTOLIN HFA) 108 (90 Base) MCG/ACT inhaler USE 2 PUFFS INTO THE LUNGS  EVERY 6 HOURS AS NEEDED FOR WHEEZING OR SHORTNESS OF  BREATH   ALPRAZolam (XANAX) 1 MG tablet Take 1 mg by mouth 2 (two) times daily.    Ascorbic Acid (VITAMIN C) 1000 MG tablet Take 1,000 mg by mouth daily.   aspirin 81 MG EC tablet Take 162 mg by mouth daily at 10 pm.   cholecalciferol (VITAMIN D3) 25 MCG (1000 UNIT) tablet Take 1,000 Units by mouth daily.   docusate sodium (COLACE) 100 MG capsule Take 100 mg by mouth daily.   pantoprazole (PROTONIX) 40 MG tablet TAKE 1 TABLET BY MOUTH  TWICE DAILY   rosuvastatin (Gregory Delgado) 20 MG tablet TAKE 1 TABLET BY MOUTH  DAILY   traMADol (ULTRAM) 50 MG tablet TAKE 1 TABLET BY MOUTH FOUR TIMES A DAY AS NEEDED   No facility-administered encounter medications on file as of 08/28/2020.     Lipid Panel    Component Value Date/Time   CHOL 129 07/16/2020 1408   TRIG 107 07/16/2020 1408   HDL 39 (L) 07/16/2020 1408   LDLCALC 70 07/16/2020 1408    10-year ASCVD risk score: The ASCVD Risk score Gregory Delgado., et al., 2013) failed to calculate for the following reasons:   The valid total cholesterol range is 130 to 320 mg/dL  Current antihyperlipidemic regimen:  Rosuvastatin 20mg  Aspirin 81mg   Previous  antihyperlipidemic medications tried: None reported   ASCVD risk enhancing conditions: age >26 and HTN  What recent interventions/DTPs have been made by any provider to improve Cholesterol control since last CPP Visit: Patient stated he has not been feeling well, he state  he gets fatigued, he is shaky, he is not sleeping well.  I advised him to call and make an appointment to be seen.   Any recent hospitalizations or ED visits since last visit with CPP? No  What diet changes have been made to improve Cholesterol?  Patient stated eats what meals are brought to him by family.   What exercise is being done to improve Cholesterol?  Patient tries to stay active, but he has been tired and not feeling well   Adherence Review: Does the patient have >5 day gap between last estimated fill dates? No  Star Rating Drugs Medication Name Last Fill Days supply Rosuvastatin  08/24/20 90 Amlodipine  06/18/20  90  (patient not taking)   Care Gaps: Last annual wellness visit? None listed    08/26/20, [facility] Clinical Pharmacist Assistant [phone]

## 2020-09-20 ENCOUNTER — Ambulatory Visit (INDEPENDENT_AMBULATORY_CARE_PROVIDER_SITE_OTHER): Payer: Medicare Other

## 2020-09-20 DIAGNOSIS — H60313 Diffuse otitis externa, bilateral: Secondary | ICD-10-CM | POA: Diagnosis not present

## 2020-09-20 DIAGNOSIS — I10 Essential (primary) hypertension: Secondary | ICD-10-CM | POA: Diagnosis not present

## 2020-09-20 DIAGNOSIS — H6123 Impacted cerumen, bilateral: Secondary | ICD-10-CM | POA: Diagnosis not present

## 2020-09-20 DIAGNOSIS — H9190 Unspecified hearing loss, unspecified ear: Secondary | ICD-10-CM | POA: Diagnosis not present

## 2020-09-20 DIAGNOSIS — H61303 Acquired stenosis of external ear canal, unspecified, bilateral: Secondary | ICD-10-CM | POA: Diagnosis not present

## 2020-09-20 DIAGNOSIS — H6093 Unspecified otitis externa, bilateral: Secondary | ICD-10-CM | POA: Diagnosis not present

## 2020-09-20 DIAGNOSIS — E782 Mixed hyperlipidemia: Secondary | ICD-10-CM

## 2020-09-20 NOTE — Progress Notes (Addendum)
 Chronic Care Management Pharmacy Note  09/25/2020 Name:  Gregory Delgado MRN:  [id number] DOB:  06/04/1950  Plan Updates:  Patient reports blood pressure well controlled without medication at this time.  States that he has limited sugar intake and continues to ride his bike at home.  Patient states that he saw the ENT doctor today and was prescribed an antibiotic that he can't take his Xanax with. He plans to call back and see if there is an alternative so he can continue alprazolam.. Patient reports that he is shaking at this time from not taking alprazolam. Pharmacist offered to call on patient's behalf but patient declines.   Subjective: Gregory Delgado is an 69 y.o. year old male who is a primary patient of Cox, Kirsten, MD.  The CCM team was consulted for assistance with disease management and care coordination needs.    Engaged with patient by telephone for follow up visit in response to provider referral for pharmacy case management and/or care coordination services.   Consent to Services:  The patient was given the following information about Chronic Care Management services today, agreed to services, and gave verbal consent: 1. CCM service includes personalized support from designated clinical staff supervised by the primary care provider, including individualized plan of care and coordination with other care providers 2. 24/7 contact phone numbers for assistance for urgent and routine care needs. 3. Service will only be billed when office clinical staff spend 20 minutes or more in a month to coordinate care. 4. Only one practitioner may furnish and bill the service in a calendar month. 5.The patient may stop CCM services at any time (effective at the end of the month) by phone call to the office staff. 6. The patient will be responsible for cost sharing (co-pay) of up to 20% of the service fee (after annual deductible is met). Patient agreed to services and consent obtained.  Patient  Care Team: Cox, Kirsten, MD as PCP - General (Family Medicine) Brown, Sarah B, RPH as Pharmacist (Pharmacist)  Recent office visits: 07/16/20-Dr Cox PCP, Labs, pain management screen, patient not taking Amlodipine.  His bp was 98/58. His bps over the last 3 days have been 106-125/68-79.UDS: c/w xanax and tramadol. Pt also has cannibus in his system. He needs to discontinue THC by his next visit. It is not adviseable to use thc while onthese medicines.   04/12/20-Dr. Cox PCP, HTN, Labs, Screening for colon cancer ambulatory referral to Gastroenterology, traMADol (ULTRAM) 50 MG tablet, A1c 5.8.  This is consistent with prediabetes.  Recommend patient limit sugar in his diet.Cholesterol well controlled. Sugar elevated to 120. Recommend add A1C. Liver and kidney function normal. Blood count normal. Negative for hepatitis c.    10/05/2019 - defer depression management to psychiatry. Healthy diet and exercise encouraged. Recommend COVID 19 and Flu shot.  Recent consult visits: 05/17/20-behavior health, anxiety disorder, Alprazolam reduced to twice a day,   02/24/2020 - Psychiatry visit.  11/30/2019 - Psychiatry - f/u 3 months.   Hospital visits: None in previous 6 months  Objective:  Lab Results  Component Value Date   CREATININE 1.27 07/16/2020   BUN 13 07/16/2020   GFRNONAA 67 10/05/2019   GFRAA 78 10/05/2019   NA 140 07/16/2020   K 4.7 07/16/2020   CALCIUM 9.3 07/16/2020   CO2 26 07/16/2020    Lab Results  Component Value Date/Time   HGBA1C 6.0 (H) 07/16/2020 02:08 PM   HGBA1C 5.8 (H) 04/12/2020 11:38 AM    Last   diabetic Eye exam: No results found for: HMDIABEYEEXA  Last diabetic Foot exam: No results found for: HMDIABFOOTEX   Lab Results  Component Value Date   CHOL 129 07/16/2020   HDL 39 (L) 07/16/2020   LDLCALC 70 07/16/2020   TRIG 107 07/16/2020   CHOLHDL 3.3 07/16/2020    Hepatic Function Latest Ref Rng & Units 07/16/2020 04/12/2020 10/05/2019  Total Protein 6.0 - 8.5 g/dL  7.1 7.2 7.0  Albumin 3.8 - 4.8 g/dL 4.5 4.4 4.4  AST 0 - 40 IU/L 15 18 13  ALT 0 - 44 IU/L 17 16 12  Alk Phosphatase 44 - 121 IU/L 96 106 103  Total Bilirubin 0.0 - 1.2 mg/dL 0.4 0.4 0.5    Lab Results  Component Value Date/Time   TSH 3.730 04/05/2019 10:34 AM    CBC Latest Ref Rng & Units 07/16/2020 04/12/2020 04/03/2020  WBC 3.4 - 10.8 x10E3/uL 10.5 10.0 10.3  Hemoglobin 13.0 - 17.7 g/dL 14.9 14.4 13.6  Hematocrit 37.5 - 51.0 % 42.2 42.5 39.5  Platelets 150 - 450 x10E3/uL 238 260 252    No results found for: VD25OH  Clinical ASCVD: No  The ASCVD Risk score (Goff DC Jr., et al., 2013) failed to calculate for the following reasons:   The valid total cholesterol range is 130 to 320 mg/dL    Depression screen PHQ 2/9 04/12/2020 04/12/2020 03/27/2020  Decreased Interest 1 0 0  Down, Depressed, Hopeless 0 0 0  PHQ - 2 Score 1 0 0  Altered sleeping 0 0 0  Tired, decreased energy 1 0 0  Change in appetite 0 0 0  Feeling bad or failure about yourself  0 0 0  Trouble concentrating 0 0 0  Moving slowly or fidgety/restless 0 0 0  Suicidal thoughts 0 0 0  PHQ-9 Score 2 0 0  Difficult doing work/chores Not difficult at all Not difficult at all -    Social History   Tobacco Use  Smoking Status Every Day   Packs/day: 0.75   Years: 55.00   Pack years: 41.25   Types: Cigarettes  Smokeless Tobacco Never   BP Readings from Last 3 Encounters:  07/16/20 138/76  04/12/20 118/80  04/03/20 (!) 142/62   Pulse Readings from Last 3 Encounters:  07/16/20 96  04/12/20 79  04/03/20 99   Wt Readings from Last 3 Encounters:  07/16/20 181 lb 6.4 oz (82.3 kg)  04/12/20 183 lb (83 kg)  04/03/20 188 lb (85.3 kg)    Assessment/Interventions: Review of patient past medical history, allergies, medications, health status, including review of consultants reports, laboratory and other test data, was performed as part of comprehensive evaluation and provision of chronic care management services.    SDOH:  (Social Determinants of Health) assessments and interventions performed: Yes   CCM Care Plan  Allergies  Allergen Reactions   Augmentin [Amoxicillin-Pot Clavulanate] Itching and Nausea And Vomiting    Medications Reviewed Today     Reviewed by Brown, Sarah B, RPH (Pharmacist) on 09/20/20 at 1336  Med List Status: <None>   Medication Order Taking? Sig Documenting Provider Last Dose Status Informant  albuterol (VENTOLIN HFA) 108 (90 Base) MCG/ACT inhaler [id number] Yes USE 2 PUFFS INTO THE LUNGS  EVERY 6 HOURS AS NEEDED FOR WHEEZING OR SHORTNESS OF  BREATH Cox, Kirsten, MD Taking Active   ALPRAZolam (XANAX) 1 MG tablet [id number] Yes Take 1 mg by mouth 2 (two) times daily.  [provider] Taking Active   Ascorbic   Acid (VITAMIN C) 1000 MG tablet [id number] Yes Take 1,000 mg by mouth daily. [provider] Taking Active   aspirin 81 MG EC tablet [id number] Yes Take 162 mg by mouth daily at 10 pm. [provider] Taking Active   cholecalciferol (VITAMIN D3) 25 MCG (1000 UNIT) tablet [id number] Yes Take 1,000 Units by mouth daily. [provider] Taking Active   docusate sodium (COLACE) 100 MG capsule [id number] Yes Take 100 mg by mouth daily. [provider] Taking Active   pantoprazole (PROTONIX) 40 MG tablet [id number] Yes TAKE 1 TABLET BY MOUTH  TWICE DAILY Cox, Kirsten, MD Taking Active   rosuvastatin (CRESTOR) 20 MG tablet [id number] Yes TAKE 1 TABLET BY MOUTH  DAILY Cox, Kirsten, MD Taking Active   traMADol (ULTRAM) 50 MG tablet [id number] Yes TAKE 1 TABLET BY MOUTH FOUR TIMES A DAY AS NEEDED Cox, Kirsten, MD Taking Active             Patient Active Problem List   Diagnosis Date Noted   Impaired fasting glucose 04/10/2019   Screening for prostate cancer 04/10/2019   Essential hypertension, benign 04/04/2019   Mixed hyperlipidemia 04/04/2019   Cigarette nicotine dependence with nicotine-induced disorder 04/04/2019   GERD without  esophagitis 04/04/2019   Cervical radicular pain 04/04/2019   Panic attack 11/18/2016   Long term current use of opiate analgesic 02/20/2012   Pain syndrome, chronic 02/20/2012   Tobacco use disorder 02/20/2012   COPD with emphysema (HCC) 01/27/2012   Other cervical disc degeneration, unspecified cervical region 01/27/2012     There is no immunization history on file for this patient.  Conditions to be addressed/monitored:  Hypertension, Hyperlipidemia, GERD, COPD, Anxiety, impaired fasting glucose and pain  Care Plan : CCM Pharmacy Care Plan  Updates made by Brown, Sarah B, RPH since 09/25/2020 12:00 AM     Problem: hyperlipidemia, hypertension, anxiety, COPD, tobacco use   Priority: High  Onset Date: 03/27/2020     Long-Range Goal: Disease Management   Start Date: 03/27/2020  Expected End Date: 03/27/2021  Recent Progress: On track  Priority: High  Note:    Current Barriers:  Unable to independently monitor therapeutic efficacy   Pharmacist Clinical Goal(s):  Over the next 90 days, patient will achieve adherence to monitoring guidelines and medication adherence to achieve therapeutic efficacy through collaboration with PharmD and provider.      Interventions: 1:1 collaboration with Cox, Kirsten, MD regarding development and update of comprehensive plan of care as evidenced by provider attestation and co-signature Inter-disciplinary care team collaboration (see longitudinal plan of care) Comprehensive medication review performed; medication list updated in electronic medical record   Hypertension (BP goal <140/90) -controlled -Current treatment:  Diet and exercise -Medications previously tried: amlodipine, lisinopril  -Current home readings: 133/83 mmHG -Current dietary habits: eats a lot of vegetables. Minimal meat.  -Current exercise habits: rides exercise bike at home.  -Denies hypotensive/hypertensive symptoms -Educated on BP goals and benefits of medications for  prevention of heart attack, stroke and kidney damage; Daily salt intake goal < 2300 mg; Exercise goal of 150 minutes per week; Importance of home blood pressure monitoring; -Counseled to monitor BP at home weekly, document, and provide log at future appointments -Counseled on diet and exercise extensively Recommended to continue current medication   Hyperlipidemia: (LDL goal < 100) -controlled -Current treatment: rosuvastatin 20 mg daily  Aspirin 81 mg EC 2 tablets daily  -Medications previously tried: none reported  -Current dietary patterns: eats mostly vegetables. Reports small   bag of popcorn at night for supper.   -Current exercise habits:  bought an exercise bike from his neighbor recently and working to improve stamins. Using bike several times a day and walking dog outdoors.  -Educated on Cholesterol goals;  Benefits of statin for ASCVD risk reduction; Importance of limiting foods high in cholesterol; Exercise goal of 150 minutes per week; -Counseled on diet and exercise extensively Recommended to continue current medication   COPD (Goal: control symptoms and prevent exacerbations) -controlled -Current treatment  Albuterol 2 puffs every 6 hours prn wheezing or shortness of breath -Medications previously tried: none reported  -MMRC/CAT score: 2 -Pulmonary function testing: none available in chart -Exacerbations requiring treatment in last 6 months: patient denies -Patient denies consistent use of maintenance inhaler -Frequency of rescue inhaler use: uses weekly  -Counseled on Proper inhaler technique; -Counseled on diet and exercise extensively Recommended to continue current medication   Depression/Anxiety (Goal: manage anxiety) -controlled -Current treatment: Alprazolam 1 mg bid - unable to take with current antibiotic -Medications previously tried/failed: clonazepam -PHQ9: 0 -GAD7: 7 -Connected with psychiatry  for mental health support -Educated on Benefits of  medication for symptom control Benefits of cognitive-behavioral therapy with or without medication -Recommended to continue current medication   Tobacco use (Goal reduce smoking) -uncontrolled -Current treatment  None reported -Patient smokes Within 30 minutes of waking -Patient triggers include: drinking coffee -On a scale of 1-10, reports MOTIVATION to quit is 1 -On a scale of 1-10, reports CONFIDENCE in quitting is 1 -Previous quit attempts: Chantix (suicide) -Provided contact information for [place] [facility]uit [facility] (1-800-QUIT-NOW) and encouraged patient to reach out to this group for support. -Counseled on slowly reducing amount of cigarettes daily   GERD (Goal: reduce symptoms of indigestion) -controlled -Current treatment  Pantoprazole 40 mg bid  -Medications previously tried: none reported  -Counseled on diet and exercise extensively Counseled on small meals, elevating head of bed and avoiding tight waist bands.     Pain (Goal: manage pain) -controlled -Current treatment  Tramadol 50 mg qid prn -Medications previously tried: none reported  -Recommended to continue current medication   Health Maintenance -Vaccine gaps: COVID/Flu - patient reports doesn't take flu shots and declines COVID vaccine.  -Current therapy:  Stool softener daily  Vitamin D 1000 unit daily  Vitamin C 1000 daily  -Educated on Cost vs benefit of each product must be carefully weighed by individual consumer -Patient is satisfied with current therapy and denies issues -Counseled on diet and exercise extensively Recommended to continue current medication     Patient Goals/Self-Care Activities Over the next 90 days, patient will:  - take medications as prescribed focus on medication adherence by using pill box   Follow Up Plan: Telephone follow up appointment with care management team member scheduled for: 03/2021       Medication Assistance: None required.  Patient affirms current coverage meets  needs.  Patient's preferred pharmacy is:  [facility] - [place], [place] - [address] [place] [address] Phone: [phone] Fax: [phone]  [id number] - [place], [place] - [address] AT [address] [place] [address] Phone: [phone] Fax: [phone]  Uses pill box? Yes Pt endorses good compliance  We discussed: Current pharmacy is preferred with insurance plan and patient is satisfied with pharmacy services Patient decided to: Continue current medication management strategy  Care Plan and Follow Up   Patient Decision:  Patient agrees to Care Plan and Follow-up.  Plan: Telephone follow up appointment with care management team member scheduled for:  6 months . Pharmacy team following up in 2 days to determine status of antibiotic change from ENT.

## 2020-09-25 NOTE — Patient Instructions (Signed)
Visit Information   Goals Addressed   None    Patient Care Plan: CCM Pharmacy Care Plan     Problem Identified: hyperlipidemia, hypertension, anxiety, COPD, tobacco use   Priority: High  Onset Date: 03/27/2020     Long-Range Goal: Disease Management   Start Date: 03/27/2020  Expected End Date: 03/27/2021  Recent Progress: On track  Priority: High  Note:    Current Barriers:  Unable to independently monitor therapeutic efficacy   Pharmacist Clinical Goal(s):  Over the next 90 days, patient will achieve adherence to monitoring guidelines and medication adherence to achieve therapeutic efficacy through collaboration with PharmD and provider.      Interventions: 1:1 collaboration with Blane Ohara, MD regarding development and update of comprehensive plan of care as evidenced by provider attestation and co-signature Inter-disciplinary care team collaboration (see longitudinal plan of care) Comprehensive medication review performed; medication list updated in electronic medical record   Hypertension (BP goal <140/90) -controlled -Current treatment:  Diet and exercise -Medications previously tried: amlodipine, lisinopril  -Current home readings: 133/83 mmHG -Current dietary habits: eats a lot of vegetables. Minimal meat.  -Current exercise habits: rides exercise bike at home.  -Denies hypotensive/hypertensive symptoms -Educated on BP goals and benefits of medications for prevention of heart attack, stroke and kidney damage; Daily salt intake goal < 2300 mg; Exercise goal of 150 minutes per week; Importance of home blood pressure monitoring; -Counseled to monitor BP at home weekly, document, and provide log at future appointments -Counseled on diet and exercise extensively Recommended to continue current medication   Hyperlipidemia: (LDL goal < 100) -controlled -Current treatment: rosuvastatin 20 mg daily  Aspirin 81 mg EC 2 tablets daily  -Medications previously tried:  none reported  -Current dietary patterns: eats mostly vegetables. Reports small bag of popcorn at night for supper.   -Current exercise habits:  bought an exercise bike from his neighbor recently and working to improve stamins. Using bike several times a day and walking dog outdoors.  -Educated on Cholesterol goals;  Benefits of statin for ASCVD risk reduction; Importance of limiting foods high in cholesterol; Exercise goal of 150 minutes per week; -Counseled on diet and exercise extensively Recommended to continue current medication   COPD (Goal: control symptoms and prevent exacerbations) -controlled -Current treatment  Albuterol 2 puffs every 6 hours prn wheezing or shortness of breath -Medications previously tried: none reported  -MMRC/CAT score: 2 -Pulmonary function testing: none available in chart -Exacerbations requiring treatment in last 6 months: patient denies -Patient denies consistent use of maintenance inhaler -Frequency of rescue inhaler use: uses weekly  -Counseled on Proper inhaler technique; -Counseled on diet and exercise extensively Recommended to continue current medication   Depression/Anxiety (Goal: manage anxiety) -controlled -Current treatment: Alprazolam 1 mg bid - unable to take with current antibiotic -Medications previously tried/failed: clonazepam -PHQ9: 0 -GAD7: 7 -Connected with psychiatry  for mental health support -Educated on Benefits of medication for symptom control Benefits of cognitive-behavioral therapy with or without medication -Recommended to continue current medication   Tobacco use (Goal reduce smoking) -uncontrolled -Current treatment  None reported -Patient smokes Within 30 minutes of waking -Patient triggers include: drinking coffee -On a scale of 1-10, reports MOTIVATION to quit is 1 -On a scale of 1-10, reports CONFIDENCE in quitting is 1 -Previous quit attempts: Chantix (suicide) -Provided contact information for [place] [facility]uit  Line (1-800-QUIT-NOW) and encouraged patient to reach out to this group for support. -Counseled on slowly reducing amount of cigarettes daily   GERD (  Goal: reduce symptoms of indigestion) -controlled -Current treatment  Pantoprazole 40 mg bid  -Medications previously tried: none reported  -Counseled on diet and exercise extensively Counseled on small meals, elevating head of bed and avoiding tight waist bands.     Pain (Goal: manage pain) -controlled -Current treatment  Tramadol 50 mg qid prn -Medications previously tried: none reported  -Recommended to continue current medication   Health Maintenance -Vaccine gaps: COVID/Flu - patient reports doesn't take flu shots and declines COVID vaccine.  -Current therapy:  Stool softener daily  Vitamin D 1000 unit daily  Vitamin C 1000 daily  -Educated on Cost vs benefit of each product must be carefully weighed by individual consumer -Patient is satisfied with current therapy and denies issues -Counseled on diet and exercise extensively Recommended to continue current medication     Patient Goals/Self-Care Activities Over the next 90 days, patient will:  - take medications as prescribed focus on medication adherence by using pill box   Follow Up Plan: Telephone follow up appointment with care management team member scheduled for: 03/2021      The patient verbalized understanding of instructions, educational materials, and care plan provided today and declined offer to receive copy of patient instructions, educational materials, and care plan.  Telephone follow up appointment with pharmacy team member scheduled for: 03/2021  Earvin Hansen, [facility]

## 2020-09-27 DIAGNOSIS — H6093 Unspecified otitis externa, bilateral: Secondary | ICD-10-CM | POA: Diagnosis not present

## 2020-09-27 DIAGNOSIS — Z8669 Personal history of other diseases of the nervous system and sense organs: Secondary | ICD-10-CM | POA: Diagnosis not present

## 2020-09-27 DIAGNOSIS — H61303 Acquired stenosis of external ear canal, unspecified, bilateral: Secondary | ICD-10-CM | POA: Diagnosis not present

## 2020-10-16 NOTE — Progress Notes (Signed)
Subjective:  Patient ID: Gregory Delgado, male    DOB: [birth year]  Age: 70 y.o. MRN: [id number]  Chief Complaint  Patient presents with   Hyperlipidemia   Hypertension    HPI GERD- taking protonix 40 mg daily Hyperlipidemia: crestor 20 mg daily and aspirin 81 mg daily. Pt eats healthy. Pt exercises: walks his dog.  Prediabetes: last a1c was 6.  GAD: sees Xanax 1 mg bid. Pt sees Barbette Merino, NP at [facility] in [facility].  Left shoulder pain: chronic. Pt takes tramadol 50 mg 2 pills bid.  Left shoulder MRI in 2014 had a subacute fracture in 2014. Partial tear of inferior GH ligament. Pain seems to be worsening.   Current Outpatient Medications on File Prior to Visit  Medication Sig Dispense Refill   albuterol (VENTOLIN HFA) 108 (90 Base) MCG/ACT inhaler USE 2 PUFFS INTO THE LUNGS  EVERY 6 HOURS AS NEEDED FOR WHEEZING OR SHORTNESS OF  BREATH 26.8 g 3   ALPRAZolam (XANAX) 1 MG tablet Take 1 mg by mouth 2 (two) times daily.      Ascorbic Acid (VITAMIN C) 1000 MG tablet Take 1,000 mg by mouth daily.     aspirin 81 MG EC tablet Take 162 mg by mouth daily at 10 pm.     cholecalciferol (VITAMIN D3) 25 MCG (1000 UNIT) tablet Take 1,000 Units by mouth daily.     docusate sodium (COLACE) 100 MG capsule Take 100 mg by mouth daily.     pantoprazole (PROTONIX) 40 MG tablet TAKE 1 TABLET BY MOUTH  TWICE DAILY 180 tablet 3   rosuvastatin (CRESTOR) 20 MG tablet TAKE 1 TABLET BY MOUTH  DAILY 90 tablet 3   traMADol (ULTRAM) 50 MG tablet TAKE 1 TABLET BY MOUTH FOUR TIMES A DAY AS NEEDED 120 tablet 2   No current facility-administered medications on file prior to visit.   Past Medical History:  Diagnosis Date   COPD (chronic obstructive pulmonary disease) (HCC)    GERD (gastroesophageal reflux disease)    Hyperlipidemia    Hypertension    Osteoarthritis    Past Surgical History:  Procedure Laterality Date   CERVICAL DISC SURGERY  2015   C2-C4    KNEE SURGERY   1976    Family History  Problem Relation Age of Onset   Diabetes Mother    Alcoholism Father    CVA Sister    Congenital heart disease Daughter    Rectal cancer Son    Cirrhosis Son    Social History   Socioeconomic History   Marital status: Single    Spouse name: Not on file   Number of children: Not on file   Years of education: Not on file   Highest education level: Not on file  Occupational History   Occupation: Retired Naval architect  Tobacco Use   Smoking status: Every Day    Packs/day: 0.75    Years: 55.00    Pack years: 41.25    Types: Cigarettes   Smokeless tobacco: Never  Vaping Use   Vaping Use: Never used  Substance and Sexual Activity   Alcohol use: Never   Drug use: Never   Sexual activity: Not on file  Other Topics Concern   Not on file  Social History Narrative   Not on file   Social Determinants of Health   Financial Resource Strain: Not on file  Food Insecurity: No Food Insecurity   Worried About Running Out of Food in the Last  Year: Never true   Ran Out of Food in the Last Year: Never true  Transportation Needs: Not on file  Physical Activity: Not on file  Stress: Not on file  Social Connections: Not on file    Review of Systems  Constitutional:  Negative for chills, diaphoresis, fatigue and fever.  HENT:  Negative for congestion, ear pain and sore throat.   Respiratory:  Negative for cough and shortness of breath.   Cardiovascular:  Negative for chest pain and leg swelling.  Gastrointestinal:  Negative for abdominal pain, constipation, diarrhea, nausea and vomiting.  Genitourinary:  Negative for dysuria and urgency.  Musculoskeletal:  Positive for arthralgias (left shoulder pain). Negative for myalgias.  Neurological:  Negative for dizziness and headaches.  Psychiatric/Behavioral:  Negative for dysphoric mood.     Objective:  BP 122/78   Pulse (!) 101   Temp (!) 95.2 F (35.1 C)   Ht 5\' 8"  (1.727 m)   Wt 171 lb (77.6 kg)   SpO2  99%   BMI 26.00 kg/m   BP/Weight 10/24/2020 10/17/2020 07/16/2020  Systolic BP 138 122 138  Diastolic BP 72 78 76  Wt. (Lbs) 177.8 171 181.4  BMI 27.03 26 27.58    Physical Exam Vitals reviewed.  Constitutional:      Appearance: Normal appearance. He is normal weight.  Cardiovascular:     Rate and Rhythm: Normal rate and regular rhythm.     Heart sounds: No murmur heard. Pulmonary:     Effort: Pulmonary effort is normal.     Breath sounds: Normal breath sounds.  Abdominal:     General: Abdomen is flat. Bowel sounds are normal.     Palpations: Abdomen is soft.     Tenderness: There is no abdominal tenderness.  Musculoskeletal:     Comments: Poor ROM of left shoulder. Hurts with abduction, external and internal rotation.  Neurological:     Mental Status: He is alert and oriented to person, place, and time.  Psychiatric:        Mood and Affect: Mood normal.        Behavior: Behavior normal.    Diabetic Foot Exam - Simple   No data filed      Lab Results  Component Value Date   WBC 9.5 10/17/2020   HGB 14.7 10/17/2020   HCT 42.7 10/17/2020   PLT 277 10/17/2020   GLUCOSE 107 (H) 10/17/2020   CHOL 135 10/17/2020   TRIG 101 10/17/2020   HDL 37 (L) 10/17/2020   LDLCALC 79 10/17/2020   ALT 17 10/17/2020   AST 16 10/17/2020   NA 142 10/17/2020   K 5.2 10/17/2020   CL 103 10/17/2020   CREATININE 1.37 (H) 10/17/2020   BUN 11 10/17/2020   CO2 26 10/17/2020   TSH 3.730 04/05/2019   HGBA1C 6.0 (H) 07/16/2020      Assessment & Plan:   Problem List Items Addressed This Visit       Cardiovascular and Mediastinum   Essential hypertension, benign    Well controlled.  No changes to medicines.  Continue to work on eating a healthy diet and exercise.  Labs drawn today.       Relevant Orders   Comprehensive metabolic panel (Completed)   CBC with Differential/Platelet (Completed)     Respiratory   COPD with emphysema (HCC)    The current medical regimen is  effective;  continue present plan and medications.         Digestive  GERD without esophagitis    The current medical regimen is effective;  continue present plan and medications.         Endocrine   Impaired fasting glucose    Recommend continue to work on eating healthy diet and exercise.         Nervous and Auditory   Cigarette nicotine dependence with nicotine-induced disorder    Recommend cessation. Refused.         Other   Mixed hyperlipidemia - Primary    Well controlled.  No changes to medicines.  Continue to work on eating a healthy diet and exercise.  Labs drawn today.        Relevant Orders   Lipid panel (Completed)   Panic attack    The current medical regimen is effective;  continue present plan and medications.       Mild recurrent major depression (HCC)    The current medical regimen is effective;  continue present plan and medications.          No orders of the defined types were placed in this encounter.   Orders Placed This Encounter  Procedures   Lipid panel   Comprehensive metabolic panel   CBC with Differential/Platelet   Cardiovascular Risk Assessment      Follow-up: Return in about 3 months (around 01/16/2021) for chronic fasting.  An After Visit Summary was printed and given to the patient.  Blane Ohara, MD [facility] [phone]

## 2020-10-17 ENCOUNTER — Encounter: Payer: Self-pay | Admitting: Family Medicine

## 2020-10-17 ENCOUNTER — Ambulatory Visit (INDEPENDENT_AMBULATORY_CARE_PROVIDER_SITE_OTHER): Payer: Medicare Other | Admitting: Family Medicine

## 2020-10-17 ENCOUNTER — Other Ambulatory Visit: Payer: Self-pay

## 2020-10-17 VITALS — BP 122/78 | HR 101 | Temp 95.2°F | Ht 68.0 in | Wt 171.0 lb

## 2020-10-17 DIAGNOSIS — J438 Other emphysema: Secondary | ICD-10-CM

## 2020-10-17 DIAGNOSIS — F17219 Nicotine dependence, cigarettes, with unspecified nicotine-induced disorders: Secondary | ICD-10-CM | POA: Diagnosis not present

## 2020-10-17 DIAGNOSIS — E782 Mixed hyperlipidemia: Secondary | ICD-10-CM | POA: Diagnosis not present

## 2020-10-17 DIAGNOSIS — R7301 Impaired fasting glucose: Secondary | ICD-10-CM

## 2020-10-17 DIAGNOSIS — K219 Gastro-esophageal reflux disease without esophagitis: Secondary | ICD-10-CM

## 2020-10-17 DIAGNOSIS — F410 Panic disorder [episodic paroxysmal anxiety] without agoraphobia: Secondary | ICD-10-CM

## 2020-10-17 DIAGNOSIS — I10 Essential (primary) hypertension: Secondary | ICD-10-CM | POA: Diagnosis not present

## 2020-10-17 DIAGNOSIS — F330 Major depressive disorder, recurrent, mild: Secondary | ICD-10-CM

## 2020-10-18 LAB — CBC WITH DIFFERENTIAL/PLATELET
Basophils Absolute: 0.1 10*3/uL (ref 0.0–0.2)
Basos: 1 %
EOS (ABSOLUTE): 0.2 10*3/uL (ref 0.0–0.4)
Eos: 2 %
Hematocrit: 42.7 % (ref 37.5–51.0)
Hemoglobin: 14.7 g/dL (ref 13.0–17.7)
Immature Grans (Abs): 0 10*3/uL (ref 0.0–0.1)
Immature Granulocytes: 0 %
Lymphocytes Absolute: 2.1 10*3/uL (ref 0.7–3.1)
Lymphs: 22 %
MCH: 31.5 pg (ref 26.6–33.0)
MCHC: 34.4 g/dL (ref 31.5–35.7)
MCV: 92 fL (ref 79–97)
Monocytes Absolute: 0.7 10*3/uL (ref 0.1–0.9)
Monocytes: 7 %
Neutrophils Absolute: 6.5 10*3/uL (ref 1.4–7.0)
Neutrophils: 68 %
Platelets: 277 10*3/uL (ref 150–450)
RBC: 4.66 x10E6/uL (ref 4.14–5.80)
RDW: 13.4 % (ref 11.6–15.4)
WBC: 9.5 10*3/uL (ref 3.4–10.8)

## 2020-10-18 LAB — COMPREHENSIVE METABOLIC PANEL
ALT: 17 IU/L (ref 0–44)
AST: 16 IU/L (ref 0–40)
Albumin/Globulin Ratio: 1.9 (ref 1.2–2.2)
Albumin: 4.5 g/dL (ref 3.8–4.8)
Alkaline Phosphatase: 100 IU/L (ref 44–121)
BUN/Creatinine Ratio: 8 — ABNORMAL LOW (ref 10–24)
BUN: 11 mg/dL (ref 8–27)
Bilirubin Total: 0.6 mg/dL (ref 0.0–1.2)
CO2: 26 mmol/L (ref 20–29)
Calcium: 9.4 mg/dL (ref 8.6–10.2)
Chloride: 103 mmol/L (ref 96–106)
Creatinine, Ser: 1.37 mg/dL — ABNORMAL HIGH (ref 0.76–1.27)
Globulin, Total: 2.4 g/dL (ref 1.5–4.5)
Glucose: 107 mg/dL — ABNORMAL HIGH (ref 65–99)
Potassium: 5.2 mmol/L (ref 3.5–5.2)
Sodium: 142 mmol/L (ref 134–144)
Total Protein: 6.9 g/dL (ref 6.0–8.5)
eGFR: 57 mL/min/{1.73_m2} — ABNORMAL LOW (ref >59)

## 2020-10-18 LAB — LIPID PANEL
Chol/HDL Ratio: 3.6 ratio (ref 0.0–5.0)
Cholesterol, Total: 135 mg/dL (ref 100–199)
HDL: 37 mg/dL — ABNORMAL LOW (ref >39)
LDL Chol Calc (NIH): 79 mg/dL (ref 0–99)
Triglycerides: 101 mg/dL (ref 0–149)
VLDL Cholesterol Cal: 19 mg/dL (ref 5–40)

## 2020-10-18 LAB — CARDIOVASCULAR RISK ASSESSMENT

## 2020-10-22 ENCOUNTER — Other Ambulatory Visit: Payer: Self-pay

## 2020-10-22 DIAGNOSIS — N289 Disorder of kidney and ureter, unspecified: Secondary | ICD-10-CM

## 2020-10-24 ENCOUNTER — Ambulatory Visit (INDEPENDENT_AMBULATORY_CARE_PROVIDER_SITE_OTHER): Payer: Medicare Other

## 2020-10-24 VITALS — BP 138/72 | HR 86 | Resp 16 | Ht 68.0 in | Wt 177.8 lb

## 2020-10-24 DIAGNOSIS — F17219 Nicotine dependence, cigarettes, with unspecified nicotine-induced disorders: Secondary | ICD-10-CM | POA: Diagnosis not present

## 2020-10-24 DIAGNOSIS — Z0000 Encounter for general adult medical examination without abnormal findings: Secondary | ICD-10-CM

## 2020-10-24 DIAGNOSIS — F17200 Nicotine dependence, unspecified, uncomplicated: Secondary | ICD-10-CM | POA: Diagnosis not present

## 2020-10-24 DIAGNOSIS — Z6827 Body mass index (BMI) 27.0-27.9, adult: Secondary | ICD-10-CM | POA: Diagnosis not present

## 2020-10-24 DIAGNOSIS — F330 Major depressive disorder, recurrent, mild: Secondary | ICD-10-CM | POA: Insufficient documentation

## 2020-10-24 NOTE — Assessment & Plan Note (Signed)
Well controlled.  ?No changes to medicines.  ?Continue to work on eating a healthy diet and exercise.  ?Labs drawn today.  ?

## 2020-10-24 NOTE — Assessment & Plan Note (Signed)
The current medical regimen is effective;  continue present plan and medications.  

## 2020-10-24 NOTE — Patient Instructions (Signed)
Health Maintenance, Male Adopting a healthy lifestyle and getting preventive care are important in promoting health and wellness. Ask your health care provider about: The right schedule for you to have regular tests and exams. Things you can do on your own to prevent diseases and keep yourself healthy. What should I know about diet, weight, and exercise? Eat a healthy diet  Eat a diet that includes plenty of vegetables, fruits, low-fat dairy products, and lean protein. Do not eat a lot of foods that are high in solid fats, added sugars, or sodium. Maintain a healthy weight Body mass index (BMI) is a measurement that can be used to identify possible weight problems. It estimates body fat based on height and weight. Your health care provider can help determine your BMI and help you achieve or maintain a healthy weight. Get regular exercise Get regular exercise. This is one of the most important things you can do for your health. Most adults should: Exercise for at least 150 minutes each week. The exercise should increase your heart rate and make you sweat (moderate-intensity exercise). Do strengthening exercises at least twice a week. This is in addition to the moderate-intensity exercise. Spend less time sitting. Even light physical activity can be beneficial. Watch cholesterol and blood lipids Have your blood tested for lipids and cholesterol at 68 years of age, then have this test every 5 years. You may need to have your cholesterol levels checked more often if: Your lipid or cholesterol levels are high. You are older than 68 years of age. You are at high risk for heart disease. What should I know about cancer screening? Many types of cancers can be detected early and may often be prevented. Depending on your health history and family history, you may need to have cancer screening at various ages. This may include screening for: Colorectal cancer. Prostate cancer. Skin cancer. Lung  cancer. What should I know about heart disease, diabetes, and high blood pressure? Blood pressure and heart disease High blood pressure causes heart disease and increases the risk of stroke. This is more likely to develop in people who have high blood pressure readings, are of African descent, or are overweight. Talk with your health care provider about your target blood pressure readings. Have your blood pressure checked: Every 3-5 years if you are 18-39 years of age. Every year if you are 40 years old or older. If you are between the ages of 65 and 75 and are a current or former smoker, ask your health care provider if you should have a one-time screening for abdominal aortic aneurysm (AAA). Diabetes Have regular diabetes screenings. This checks your fasting blood sugar level. Have the screening done: Once every three years after age 45 if you are at a normal weight and have a low risk for diabetes. More often and at a younger age if you are overweight or have a high risk for diabetes. What should I know about preventing infection? Hepatitis B If you have a higher risk for hepatitis B, you should be screened for this virus. Talk with your health care provider to find out if you are at risk for hepatitis B infection. Hepatitis C Blood testing is recommended for: Everyone born from 1945 through 1965. Anyone with known risk factors for hepatitis C. Sexually transmitted infections (STIs) You should be screened each year for STIs, including gonorrhea and chlamydia, if: You are sexually active and are younger than 68 years of age. You are older than 68 years   of age and your health care provider tells you that you are at risk for this type of infection. Your sexual activity has changed since you were last screened, and you are at increased risk for chlamydia or gonorrhea. Ask your health care provider if you are at risk. Ask your health care provider about whether you are at high risk for HIV.  Your health care provider may recommend a prescription medicine to help prevent HIV infection. If you choose to take medicine to prevent HIV, you should first get tested for HIV. You should then be tested every 3 months for as long as you are taking the medicine. Follow these instructions at home: Lifestyle Do not use any products that contain nicotine or tobacco, such as cigarettes, e-cigarettes, and chewing tobacco. If you need help quitting, ask your health care provider. Do not use street drugs. Do not share needles. Ask your health care provider for help if you need support or information about quitting drugs. Alcohol use Do not drink alcohol if your health care provider tells you not to drink. If you drink alcohol: Limit how much you have to 0-2 drinks a day. Be aware of how much alcohol is in your drink. In the [place] one drink equals one 12 oz bottle of beer (355 mL), one 5 oz glass of wine (148 mL), or one 1 oz glass of hard liquor (44 mL). General instructions Schedule regular health, dental, and eye exams. Stay current with your vaccines. Tell your health care provider if: You often feel depressed. You have ever been abused or do not feel safe at home. Summary Adopting a healthy lifestyle and getting preventive care are important in promoting health and wellness. Follow your health care provider's instructions about healthy diet, exercising, and getting tested or screened for diseases. Follow your health care provider's instructions on monitoring your cholesterol and blood pressure. This information is not intended to replace advice given to you by your health care provider. Make sure you discuss any questions you have with your health care provider. Document Revised: 04/06/2020 Document Reviewed: 01/20/2018 Elsevier Patient Education  2022 Else[employer] Inc[employer]  Steps to Quit Smoking Smoking tobacco is the leading cause of preventable death. It can affect almost every organ in the  body. Smoking puts you and people around you at risk for many serious, long-lasting (chronic) diseases. Quitting smoking can be hard, but it is one of the best things that you can do for your health. It is never too late to quit. How do I get ready to quit? When you decide to quit smoking, make a plan to help you succeed. Before you quit: Pick a date to quit. Set a date within the next 2 weeks to give you time to prepare. Write down the reasons why you are quitting. Keep this list in places where you will see it often. Tell your family, friends, and co-workers that you are quitting. Their support is important. Talk with your doctor about the choices that may help you quit. Find out if your health insurance will pay for these treatments. Know the people, places, things, and activities that make you want to smoke (triggers). Avoid them. What first steps can I take to quit smoking? Throw away all cigarettes at home, at work, and in your car. Throw away the things that you use when you smoke, such as ashtrays and lighters. Clean your car. Make sure to empty the ashtray. Clean your home, including curtains and carpets. What can I  do to help me quit smoking? Talk with your doctor about taking medicines and seeing a counselor at the same time. You are more likely to succeed when you do both. If you are pregnant or breastfeeding, talk with your doctor about counseling or other ways to quit smoking. Do not take medicine to help you quit smoking unless your doctor tells you to do so. To quit smoking: Quit right away Quit smoking totally, instead of slowly cutting back on how much you smoke over a period of time. Go to counseling. You are more likely to quit if you go to counseling sessions regularly. Take medicine You may take medicines to help you quit. Some medicines need a prescription, and some you can buy over-the-counter. Some medicines may contain a drug called nicotine to replace the nicotine in  cigarettes. Medicines may: Help you to stop having the desire to smoke (cravings). Help to stop the problems that come when you stop smoking (withdrawal symptoms). Your doctor may ask you to use: Nicotine patches, gum, or lozenges. Nicotine inhalers or sprays. Non-nicotine medicine that is taken by mouth. Find resources Find resources and other ways to help you quit smoking and remain smoke-free after you quit. These resources are most helpful when you use them often. They include: Online chats with a Veterinary surgeon. Phone quitlines. Printed Materials engineer. Support groups or group counseling. Text messaging programs. Mobile phone apps. Use apps on your mobile phone or tablet that can help you stick to your quit plan. There are many free apps for mobile phones and tablets as well as websites. Examples include Quit Guide from the [employer] and smokefree.gov  What things can I do to make it easier to quit?  Talk to your family and friends. Ask them to support and encourage you. Call a phone quitline (1-800-QUIT-NOW), reach out to support groups, or work with a Veterinary surgeon. Ask people who smoke to not smoke around you. Avoid places that make you want to smoke, such as: Bars. Parties. Smoke-break areas at work. Spend time with people who do not smoke. Lower the stress in your life. Stress can make you want to smoke. Try these things to help your stress: Getting regular exercise. Doing deep-breathing exercises. Doing yoga. Meditating. Doing a body scan. To do this, close your eyes, focus on one area of your body at a time from head to toe. Notice which parts of your body are tense. Try to relax the muscles in those areas. How will I feel when I quit smoking? Day 1 to 3 weeks Within the first 24 hours, you may start to have some problems that come from quitting tobacco. These problems are very bad 2-3 days after you quit, but they do not often last for more than 2-3 weeks. You may get these  symptoms: Mood swings. Feeling restless, nervous, angry, or annoyed. Trouble concentrating. Dizziness. Strong desire for high-sugar foods and nicotine. Weight gain. Trouble pooping (constipation). Feeling like you may vomit (nausea). Coughing or a sore throat. Changes in how the medicines that you take for other issues work in your body. Depression. Trouble sleeping (insomnia). Week 3 and afterward After the first 2-3 weeks of quitting, you may start to notice more positive results, such as: Better sense of smell and taste. Less coughing and sore throat. Slower heart rate. Lower blood pressure. Clearer skin. Better breathing. Fewer sick days. Quitting smoking can be hard. Do not give up if you fail the first time. Some people need to try a  few times before they succeed. Do your best to stick to your quit plan, and talk with your doctor if you have any questions or concerns. Summary Smoking tobacco is the leading cause of preventable death. Quitting smoking can be hard, but it is one of the best things that you can do for your health. When you decide to quit smoking, make a plan to help you succeed. Quit smoking right away, not slowly over a period of time. When you start quitting, seek help from your doctor, family, or friends. This information is not intended to replace advice given to you by your health care provider. Make sure you discuss any questions you have with your health care provider. Document Revised: 10/22/2018 Document Reviewed: 04/17/2018 Elsevier Patient Education  2022 [employer].

## 2020-10-24 NOTE — Assessment & Plan Note (Signed)
Recommend continue to work on eating healthy diet and exercise.  

## 2020-10-24 NOTE — Assessment & Plan Note (Signed)
Recommend cessation. Refused.  °

## 2020-10-24 NOTE — Progress Notes (Signed)
Subjective:   Gregory Delgado is a 70 y.o. male who presents for Medicare Annual/Subsequent preventive examination.  This wellness visit is conducted by a nurse.  The patient's medications were reviewed and reconciled since the patient's last visit.  History details were provided by the patient.  The history appears to be reliable.    Patient's last AWV was more than one year ago.   Medical History: Patient history and Family history was reviewed  Medications, Allergies, and preventative health maintenance was reviewed and updated.   Review of Systems    Review of Systems  Constitutional: Negative.   HENT: Negative.    Eyes: Negative.   Respiratory: Negative.  Negative for cough and shortness of breath.   Cardiovascular: Negative.  Negative for chest pain and palpitations.  Gastrointestinal: Negative.   Musculoskeletal:  Positive for arthralgias and back pain.       Left shoulder pain  Neurological:  Positive for headaches.       Tingling left upper and lower extremity   Psychiatric/Behavioral:  Negative for confusion, sleep disturbance and suicidal ideas.     Cardiac Risk Factors include: advanced age (>88men, >75 women);diabetes mellitus;dyslipidemia;hypertension;male gender;smoking/ tobacco exposure     Objective:    Today's Vitals   10/24/20 1445  BP: 138/72  Pulse: 86  Resp: 16  SpO2: 97%  Weight: 177 lb 12.8 oz (80.6 kg)  Height: 5\' 8"  (1.727 m)  PainSc: 0-No pain   Body mass index is 27.03 kg/m.  Advanced Directives 10/24/2020  Does Patient Have a Medical Advance Directive? No  Would patient like information on creating a medical advance directive? No - Patient declined    Current Medications (verified) Outpatient Encounter Medications as of 10/24/2020  Medication Sig   albuterol (VENTOLIN HFA) 108 (90 Base) MCG/ACT inhaler USE 2 PUFFS INTO THE LUNGS  EVERY 6 HOURS AS NEEDED FOR WHEEZING OR SHORTNESS OF  BREATH   ALPRAZolam (XANAX) 1 MG tablet Take 1 mg by  mouth 2 (two) times daily.    Ascorbic Acid (VITAMIN C) 1000 MG tablet Take 1,000 mg by mouth daily.   aspirin 81 MG EC tablet Take 162 mg by mouth daily at 10 pm.   cholecalciferol (VITAMIN D3) 25 MCG (1000 UNIT) tablet Take 1,000 Units by mouth daily.   docusate sodium (COLACE) 100 MG capsule Take 100 mg by mouth daily.   pantoprazole (PROTONIX) 40 MG tablet TAKE 1 TABLET BY MOUTH  TWICE DAILY   rosuvastatin (CRESTOR) 20 MG tablet TAKE 1 TABLET BY MOUTH  DAILY   traMADol (ULTRAM) 50 MG tablet TAKE 1 TABLET BY MOUTH FOUR TIMES A DAY AS NEEDED   No facility-administered encounter medications on file as of 10/24/2020.    Allergies (verified) Augmentin [amoxicillin-pot clavulanate]   History: Past Medical History:  Diagnosis Date   COPD (chronic obstructive pulmonary disease) (HCC)    GERD (gastroesophageal reflux disease)    Hyperlipidemia    Hypertension    Osteoarthritis    Past Surgical History:  Procedure Laterality Date   CERVICAL DISC SURGERY  2015   C2-C4    KNEE SURGERY  1976   Family History  Problem Relation Age of Onset   Diabetes Mother    Alcoholism Father    CVA Sister    Congenital heart disease Daughter    Rectal cancer Son    Cirrhosis Son    Social History   Socioeconomic History   Marital status: Single  Occupational History   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  Tobacco Use   Smoking status: Every Day    Packs/day: 0.75    Years: 55.00    Pack years: 41.25    Types: Cigarettes   Smokeless tobacco: Never  Vaping Use   Vaping Use: Never used  Substance and Sexual Activity   Alcohol use: Never   Drug use: Never   Social Determinants of Corporate investment banker Strain: Not on file  Food Insecurity: No Food Insecurity   Worried About Programme researcher, broadcasting/film/video in the Last Year: Never true   Ran Out of Food in the Last Year: Never true  Transportation Needs: Not on file  Physical Activity: Not on file  Stress: Not on file  Social Connections:  Not on file    Tobacco Counseling Ready to quit: No Counseling given: Yes - discussed gradual smoking reduction and benefits of quitting   Clinical Intake:  Pre-visit preparation completed: Yes Pain : No/denies pain Pain Score: 0-No pain   BMI - recorded: 27.03 Nutritional Status: BMI 25 -29 Overweight Nutritional Risks: None Diabetes: Yes (A1C 6.0) CBG done?: No Did pt. bring in CBG monitor from home?: No How often do you need to have someone help you when you read instructions, pamphlets, or other written materials from your doctor or pharmacy?: 1 - Never Interpreter Needed?: No    Activities of Daily Living In your present state of health, do you have any difficulty performing the following activities: 10/24/2020 04/12/2020  Hearing? N Y  Comment - Has appt with ENT  Vision? N N  Difficulty concentrating or making decisions? N N  Walking or climbing stairs? N N  Dressing or bathing? N N  Doing errands, shopping? N N  Preparing Food and eating ? N -  Using the Toilet? N -  In the past six months, have you accidently leaked urine? N -  Do you have problems with loss of bowel control? N -  Managing your Medications? N -  Managing your Finances? N -  Housekeeping or managing your Housekeeping? N -  Some recent data might be hidden    Patient Care Team: Blane Ohara, MD as PCP - General (Family Medicine) Earvin Hansen, [facility] (Inactive) as Pharmacist (Pharmacist) Maudie Flakes, MD as Referring Physician (Otolaryngology)  Indicate any recent Medical Services you may have received from other than [facility] providers in the past year (date may be approximate). Barbette Merino - [place] (psych)    Assessment:   This is a routine wellness examination for Gregory Delgado.   Dietary issues and exercise activities discussed: Current Exercise Habits: Home exercise routine, Type of exercise: walking;Other - see comments (stationary bicycle), Time (Minutes): 20, Frequency (Times/Week): 4, Weekly  Exercise (Minutes/Week): 80, Intensity: Mild, Exercise limited by: None identified   Depression Screen PHQ 2/9 Scores 10/17/2020 10/17/2020 04/12/2020 04/12/2020 03/27/2020 10/17/2019 10/05/2019  PHQ - 2 Score 1 0 1 0 0 0 0  PHQ- 9 Score 3 - 2 0 0 1 -    Fall Risk Fall Risk  10/24/2020 10/17/2020 07/16/2020 04/12/2020 10/05/2019  Falls in the past year? 0 0 0 0 0  Number falls in past yr: 0 0 0 0 0  Injury with Fall? 0 0 0 0 -  Risk for fall due to : No Fall Risks No Fall Risks - No Fall Risks -  Follow up Falls evaluation completed Falls evaluation completed - Falls evaluation completed -    FALL RISK PREVENTION PERTAINING TO THE HOME:  Any stairs  in or around the home? No  If so, are there any without handrails? No  Home free of loose throw rugs in walkways, pet beds, electrical cords, etc? Yes  Adequate lighting in your home to reduce risk of falls? Yes   ASSISTIVE DEVICES UTILIZED TO PREVENT FALLS:  Life alert? No  Use of a cane, walker or w/c? No  Grab bars in the bathroom? No  Shower chair or bench in shower? No  Elevated toilet seat or a handicapped toilet? No  Gait steady and fast without use of assistive device  Cognitive Function:     6CIT Screen 10/24/2020  What Year? 0 points  What month? 0 points  What time? 0 points  Count back from 20 0 points  Months in reverse 0 points  Repeat phrase 0 points  Total Score 0    Immunizations  There is no immunization history on file for this patient.  TDAP status: Due, Education has been provided regarding the importance of this vaccine. Advised may receive this vaccine at local pharmacy or Health Dept. Aware to provide a copy of the vaccination record if obtained from local pharmacy or Health Dept. Verbalized acceptance and understanding.  Flu Vaccine status: Declined, Education has been provided regarding the importance of this vaccine but patient still declined. Advised may receive this vaccine at local pharmacy or Health Dept. Aware  to provide a copy of the vaccination record if obtained from local pharmacy or Health Dept. Verbalized acceptance and understanding.  Pneumococcal vaccine status: Declined,  Education has been provided regarding the importance of this vaccine but patient still declined. Advised may receive this vaccine at local pharmacy or Health Dept. Aware to provide a copy of the vaccination record if obtained from local pharmacy or Health Dept. Verbalized acceptance and understanding.   Covid-19 vaccine status: Declined, Education has been provided regarding the importance of this vaccine but patient still declined. Advised may receive this vaccine at local pharmacy or Health Dept.or vaccine clinic. Aware to provide a copy of the vaccination record if obtained from local pharmacy or Health Dept. Verbalized acceptance and understanding.  Qualifies for Shingles Vaccine? Yes   Zostavax completed No   Shingrix Completed?: No.    Education has been provided regarding the importance of this vaccine. Patient has been advised to call insurance company to determine out of pocket expense if they have not yet received this vaccine. Advised may also receive vaccine at local pharmacy or Health Dept. Verbalized acceptance and understanding.  Screening Tests Health Maintenance  Topic Date Due   TETANUS/TDAP  04/12/2021 (Originally 01/25/1970)   PNA vac Low Risk Adult (1 of 2 - PCV13) 04/12/2021 (Originally 01/24/2018)   COLONOSCOPY (Pts 45-34yrs Insurance coverage will need to be confirmed)  10/17/2021 (Originally 01/24/1998)   Hepatitis C Screening  Completed   HPV VACCINES  Aged Out   INFLUENZA VACCINE  Discontinued   COVID-19 Vaccine  Discontinued   Zoster Vaccines- Shingrix  Discontinued    Health Maintenance  There are no preventive care reminders to display for this patient.  Colorectal cancer screening: Type of screening: Colonoscopy. Completed 07/2016. Repeat every 10 years  Lung Cancer Screening: (Low Dose  CT Chest recommended if Age 26-80 years, 30 pack-year currently smoking OR have quit w/in 15years.) does qualify.   Lung Cancer Screening Referral: patient declined at this time  Additional Screening:  Vision Screening: Recommended annual ophthalmology exams for early detection of glaucoma and other disorders of the eye. Is the patient  up to date with their annual eye exam?  Yes   Dental Screening: Recommended annual dental exams for proper oral hygiene    Plan:    1- The following vaccines were recommended and declined: Flu, COVID, Tetanus, Pneumonia, and Shingrix 2- Tobacco cessation discussed - patient has smoked 55 years.  He declines lung cancer screening at this time.  We discussed the health risks associated with nicotine use. 3- Diet - patient eats vegetables daily, his neighbors cook for him most days 4-  Continue exercise daily 5- Diabetic eye exam due in January (scheduled)  6- Scheduled to come back 9/26 for lab rechecking kidney function 7- Advance Directive - patient educated and printed information given - once competed please bring a copy to the office for our files  I have personally reviewed and noted the following in the patient's chart:   Medical and social history Use of alcohol, tobacco or illicit drugs  Current medications and supplements including opioid prescriptions. Patient is not currently taking opioid prescriptions. Functional ability and status Nutritional status Physical activity Advanced directives List of other physicians Hospitalizations, surgeries, and ER visits in previous 12 months Vitals Screenings to include cognitive, depression, and falls Referrals and appointments  In addition, I have reviewed and discussed with patient certain preventive protocols, quality metrics, and best practice recommendations. A written personalized care plan for preventive services as well as general preventive health recommendations were provided to patient.      Jacklynn Bue, LPN   [id number]/[id number]

## 2020-11-01 ENCOUNTER — Other Ambulatory Visit: Payer: Self-pay | Admitting: Family Medicine

## 2020-11-01 DIAGNOSIS — M19011 Primary osteoarthritis, right shoulder: Secondary | ICD-10-CM

## 2020-11-05 ENCOUNTER — Other Ambulatory Visit: Payer: Self-pay

## 2020-11-05 ENCOUNTER — Other Ambulatory Visit: Payer: Medicare Other

## 2020-11-05 DIAGNOSIS — N289 Disorder of kidney and ureter, unspecified: Secondary | ICD-10-CM | POA: Diagnosis not present

## 2020-11-06 LAB — COMPREHENSIVE METABOLIC PANEL
ALT: 29 IU/L (ref 0–44)
AST: 18 IU/L (ref 0–40)
Albumin/Globulin Ratio: 1.8 (ref 1.2–2.2)
Albumin: 4.3 g/dL (ref 3.8–4.8)
Alkaline Phosphatase: 104 IU/L (ref 44–121)
BUN/Creatinine Ratio: 7 — ABNORMAL LOW (ref 10–24)
BUN: 8 mg/dL (ref 8–27)
Bilirubin Total: 0.3 mg/dL (ref 0.0–1.2)
CO2: 24 mmol/L (ref 20–29)
Calcium: 9.1 mg/dL (ref 8.6–10.2)
Chloride: 100 mmol/L (ref 96–106)
Creatinine, Ser: 1.14 mg/dL (ref 0.76–1.27)
Globulin, Total: 2.4 g/dL (ref 1.5–4.5)
Glucose: 98 mg/dL (ref 70–99)
Potassium: 4.1 mmol/L (ref 3.5–5.2)
Sodium: 140 mmol/L (ref 134–144)
Total Protein: 6.7 g/dL (ref 6.0–8.5)
eGFR: 70 mL/min/{1.73_m2} (ref >59)

## 2020-11-16 ENCOUNTER — Telehealth: Payer: Self-pay

## 2020-11-16 NOTE — Chronic Care Management (AMB) (Signed)
    Chronic Care Management Pharmacy Assistant   Name: Gregory Delgado  MRN: [id number] DOB: 1950/07/17   Reason for Encounter: Disease State/ Hypertension  Recent office visits:  10-17-2020 [name], MD. Glucose= 107, Creatinine= 1.37, eGFR= 57, BUN/Creatinine= 8. HDL= 37.  10-24-2020 Medicare annual wellness exam.  Recent consult visits:  None  Hospital visits:  None in previous 6 months  Medications: Outpatient Encounter Medications as of 11/16/2020  Medication Sig Note   albuterol (VENTOLIN HFA) 108 (90 Base) MCG/ACT inhaler USE 2 PUFFS INTO THE LUNGS  EVERY 6 HOURS AS NEEDED FOR WHEEZING OR SHORTNESS OF  BREATH    ALPRAZolam (XANAX) 1 MG tablet Take 1 mg by mouth 2 (two) times daily.  10/24/2020: Prescribed by Chaya Jan   Ascorbic Acid (VITAMIN C) 1000 MG tablet Take 1,000 mg by mouth daily.    aspirin 81 MG EC tablet Take 162 mg by mouth daily at 10 pm.    cholecalciferol (VITAMIN D3) 25 MCG (1000 UNIT) tablet Take 1,000 Units by mouth daily.    docusate sodium (COLACE) 100 MG capsule Take 100 mg by mouth daily.    pantoprazole (PROTONIX) 40 MG tablet TAKE 1 TABLET BY MOUTH  TWICE DAILY    rosuvastatin (CRESTOR) 20 MG tablet TAKE 1 TABLET BY MOUTH  DAILY    traMADol (ULTRAM) 50 MG tablet TAKE 1 TABLET BY MOUTH FOUR TIMES A DAY AS NEEDED    No facility-administered encounter medications on file as of 11/16/2020.     Recent Office Vitals: BP Readings from Last 3 Encounters:  10/24/20 138/72  10/17/20 122/78  07/16/20 138/76   Pulse Readings from Last 3 Encounters:  10/24/20 86  10/17/20 (!) 101  07/16/20 96    Wt Readings from Last 3 Encounters:  10/24/20 177 lb 12.8 oz (80.6 kg)  10/17/20 171 lb (77.6 kg)  07/16/20 181 lb 6.4 oz (82.3 kg)     Kidney Function Lab Results  Component Value Date/Time   CREATININE 1.14 11/05/2020 04:12 PM   CREATININE 1.37 (H) 10/17/2020 10:58 AM   GFRNONAA 67 10/05/2019 11:47 AM   GFRAA 78 10/05/2019 11:47 AM     BMP Latest Ref Rng & Units 11/05/2020 10/17/2020 07/16/2020  Glucose 70 - 99 mg/dL 98 107(H) 104(H)  BUN 8 - 27 mg/dL $Remove'8 11 13  'gcgJFRk$ Creatinine 0.76 - 1.27 mg/dL 1.14 1.37(H) 1.27  BUN/Creat Ratio 10 - 24 7(L) 8(L) 10  Sodium 134 - 144 mmol/L 140 142 140  Potassium 3.5 - 5.2 mmol/L 4.1 5.2 4.7  Chloride 96 - 106 mmol/L 100 103 101  CO2 20 - 29 mmol/L $RemoveB'24 26 26  'ODeCDjHL$ Calcium 8.6 - 10.2 mg/dL 9.1 9.4 9.3     Care Gaps: Last annual wellness visit? 10-24-2020  Star Rating Drugs:  Rosuvastatin 20 mg- Last filled 08-24-2020 90 DS  11-16-2020: 1st attempt left VM 11-19-2020: 2nd attempt left VM 11-26-2020: 3rd attempt left VM  [place] Clinical Pharmacist Assistant [phone]

## 2021-01-17 ENCOUNTER — Telehealth: Payer: Self-pay

## 2021-01-17 NOTE — Chronic Care Management (AMB) (Signed)
    Chronic Care Management Pharmacy Assistant   Name: Gregory Delgado  MRN: [id number] DOB: 1950/07/14  Reason for Encounter: Disease State/ Cholesterol  Recent office visits:  10-17-2020 [name], MD. Glucose= 107, Creatinine= 1.37, eGFR= 57, BUN/Creatinine= 8. HDL= 37.   10-24-2020 [name], LPN. Medicare annual wellness exam.  Recent consult visits:  None  Hospital visits:  None in previous 6 months  Medications: Outpatient Encounter Medications as of 01/17/2021  Medication Sig Note   albuterol (VENTOLIN HFA) 108 (90 Base) MCG/ACT inhaler USE 2 PUFFS INTO THE LUNGS  EVERY 6 HOURS AS NEEDED FOR WHEEZING OR SHORTNESS OF  BREATH    ALPRAZolam (XANAX) 1 MG tablet Take 1 mg by mouth 2 (two) times daily.  10/24/2020: Prescribed by Chaya Jan   Ascorbic Acid (VITAMIN C) 1000 MG tablet Take 1,000 mg by mouth daily.    aspirin 81 MG EC tablet Take 162 mg by mouth daily at 10 pm.    cholecalciferol (VITAMIN D3) 25 MCG (1000 UNIT) tablet Take 1,000 Units by mouth daily.    docusate sodium (COLACE) 100 MG capsule Take 100 mg by mouth daily.    pantoprazole (PROTONIX) 40 MG tablet TAKE 1 TABLET BY MOUTH  TWICE DAILY    rosuvastatin (CRESTOR) 20 MG tablet TAKE 1 TABLET BY MOUTH  DAILY    traMADol (ULTRAM) 50 MG tablet TAKE 1 TABLET BY MOUTH FOUR TIMES A DAY AS NEEDED    No facility-administered encounter medications on file as of 01/17/2021.    01-17-2021: 1st attempt left VM 01-25-2021: 2nd attempt left VM 02-05-2021: 3rd attempt left VM  Care Gaps: Last annual wellness visit? 10-24-2020  Star Rating Drugs: Rosuvastatin 20 mg- Last filled 11-17-2020 90 DS  [place] Clinical Pharmacist Assistant [phone]

## 2021-01-22 ENCOUNTER — Other Ambulatory Visit: Payer: Self-pay | Admitting: Physician Assistant

## 2021-01-22 DIAGNOSIS — I10 Essential (primary) hypertension: Secondary | ICD-10-CM

## 2021-01-23 NOTE — Progress Notes (Signed)
Subjective:  Patient ID: Gregory Delgado, male    DOB: 01-01-1951  Age: 70 y.o. MRN: [id number]  Chief Complaint  Patient presents with   Hyperlipidemia   Hypertension    Hypertension: Bp increased over the last 3 weeks. He restarted amlodipine 10 mg once daily. Bp had increased to 190/110. No chest pain or sob when this happened.   GERD: on pantoprazole 40 mg one bid.   Hyperlipidemia: On rosuvastatin 20 mg once daily. Baby aspirin 81 mg once daily.   Back pain began 3 weeks ago. No injury. Just got up out of chair and had sudden pain in back. NO hematuria. Used heating pad and icy hot. Took his tramadol. Back pain has resolved.    Current Outpatient Medications on File Prior to Visit  Medication Sig Dispense Refill   amLODipine (NORVASC) 10 MG tablet Take 10 mg by mouth daily.     Multiple Vitamins-Minerals (CENTRUM SILVER 50+MEN) TABS Take by mouth.     albuterol (VENTOLIN HFA) 108 (90 Base) MCG/ACT inhaler USE 2 PUFFS INTO THE LUNGS  EVERY 6 HOURS AS NEEDED FOR WHEEZING OR SHORTNESS OF  BREATH 26.8 g 3   ALPRAZolam (XANAX) 1 MG tablet Take 1 mg by mouth 2 (two) times daily.      Ascorbic Acid (VITAMIN C) 1000 MG tablet Take 1,000 mg by mouth daily.     aspirin 81 MG EC tablet Take 162 mg by mouth daily at 10 pm.     cholecalciferol (VITAMIN D3) 25 MCG (1000 UNIT) tablet Take 1,000 Units by mouth daily.     docusate sodium (COLACE) 100 MG capsule Take 100 mg by mouth daily.     pantoprazole (PROTONIX) 40 MG tablet TAKE 1 TABLET BY MOUTH  TWICE DAILY 180 tablet 3   rosuvastatin (CRESTOR) 20 MG tablet TAKE 1 TABLET BY MOUTH  DAILY 90 tablet 3   No current facility-administered medications on file prior to visit.   Past Medical History:  Diagnosis Date   COPD (chronic obstructive pulmonary disease) (HCC)    GERD (gastroesophageal reflux disease)    Hyperlipidemia    Hypertension    Osteoarthritis    Past Surgical History:  Procedure Laterality Date   CERVICAL DISC SURGERY   2015   C2-C4    KNEE SURGERY  1976    Family History  Problem Relation Age of Onset   Diabetes Mother    Alcoholism Father    CVA Sister    Congenital heart disease Daughter    Rectal cancer Son    Cirrhosis Son    Social History   Socioeconomic History   Marital status: Single    Spouse name: Not on file   Number of children: Not on file   Years of education: Not on file   Highest education level: Not on file  Occupational History   Occupation: Retired Naval architect  Tobacco Use   Smoking status: Every Day    Packs/day: 0.75    Years: 55.00    Pack years: 41.25    Types: Cigarettes   Smokeless tobacco: Never  Vaping Use   Vaping Use: Never used  Substance and Sexual Activity   Alcohol use: Never   Drug use: Never   Sexual activity: Not on file  Other Topics Concern   Not on file  Social History Narrative   Not on file   Social Determinants of Health   Financial Resource Strain: Not on file  Food Insecurity: No Food Insecurity  Worried About Charity fundraiser in the Last Year: Never true   [place] in the Last Year: Never true  Transportation Needs: No Transportation Needs   Lack of Transportation (Medical): No   Lack of Transportation (Non-Medical): No  Physical Activity: Not on file  Stress: Not on file  Social Connections: Socially Isolated   Frequency of Communication with Friends and Family: Three times a week   Frequency of Social Gatherings with Friends and Family: Three times a week   Attends Religious Services: Never   Active Member of Clubs or Organizations: No   Attends Archivist Meetings: Never   Marital Status: Divorced    Review of Systems  Constitutional:  Negative for appetite change, fatigue and fever.  HENT:  Positive for rhinorrhea. Negative for congestion, ear pain and sore throat.   Respiratory:  Negative for cough and shortness of breath.   Cardiovascular:  Negative for chest pain and leg swelling.   Gastrointestinal:  Negative for abdominal pain, constipation, diarrhea, nausea and vomiting.  Genitourinary:  Negative for dysuria and frequency.  Musculoskeletal:  Negative for arthralgias (left shoulder pain) and myalgias.  Neurological:  Negative for dizziness and headaches.  Psychiatric/Behavioral:  Negative for dysphoric mood. The patient is not nervous/anxious.     Objective:  BP 136/72    Pulse 100    Temp (!) 96.3 F (35.7 C)    Resp 18    Ht 5\' 8"  (1.727 m)    Wt 182 lb (82.6 kg)    BMI 27.67 kg/m   BP/Weight 01/24/2021 [id number] [id number]  Systolic BP [id number] [id number]  Diastolic BP 72 72 78  Wt. (Lbs) 182 177.8 171  BMI 27.67 27.03 26    Physical Exam Vitals reviewed.  Constitutional:      Appearance: Normal appearance. He is normal weight.  Neck:     Vascular: No carotid bruit.  Cardiovascular:     Rate and Rhythm: Normal rate and regular rhythm.     Heart sounds: Normal heart sounds.  Pulmonary:     Effort: Pulmonary effort is normal.     Breath sounds: Normal breath sounds.  Abdominal:     General: Abdomen is flat. Bowel sounds are normal.     Palpations: Abdomen is soft.  Musculoskeletal:        General: Tenderness (lumbar back. negative SLR BL.) present.  Neurological:     Mental Status: He is alert and oriented to person, place, and time.  Psychiatric:        Mood and Affect: Mood normal.        Behavior: Behavior normal.    Diabetic Foot Exam - Simple   No data filed      Lab Results  Component Value Date   WBC 11.9 (H) 01/24/2021   HGB 15.5 01/24/2021   HCT 46.4 01/24/2021   PLT 286 01/24/2021   GLUCOSE 113 (H) 01/24/2021   CHOL 125 01/24/2021   TRIG 124 01/24/2021   HDL 37 (L) 01/24/2021   LDLCALC 66 01/24/2021   ALT 20 01/24/2021   AST 20 01/24/2021   NA 139 01/24/2021   K 4.4 01/24/2021   CL 100 01/24/2021   CREATININE 1.39 (H) 01/24/2021   BUN 11 01/24/2021   CO2 26 01/24/2021   TSH 3.730 04/05/2019   HGBA1C 5.9 (H) 01/24/2021       Assessment & Plan:   Problem List Items Addressed This Visit       Cardiovascular  and Mediastinum   Essential hypertension, benign    Well controlled.  No changes to medicines.  Continue to work on eating a healthy diet and exercise.  Labs drawn today.        Relevant Medications   amLODipine (NORVASC) 10 MG tablet     Digestive   GERD without esophagitis    The current medical regimen is effective;  continue present plan and medications.         Endocrine   Impaired fasting glucose    Recommend continue to work on eating healthy diet and exercise.       Relevant Orders   Hemoglobin A1c (Completed)     Nervous and Auditory   Cigarette nicotine dependence with nicotine-induced disorder    Recommend cessation        Other   Mixed hyperlipidemia - Primary    Well controlled.  No changes to medicines.  Continue to work on eating a healthy diet and exercise.  Labs drawn today.        Relevant Medications   amLODipine (NORVASC) 10 MG tablet   Other Relevant Orders   CBC with Differential/Platelet (Completed)   Comprehensive metabolic panel (Completed)   Lipid panel (Completed)   Mild recurrent major depression ([place])    The current medical regimen is effective;  continue present plan and medications.       Lumbar back pain    Recommend heat or ice.  Recommend stretching.       .  No orders of the defined types were placed in this encounter.    Orders Placed This Encounter  Procedures   CBC with Differential/Platelet   Comprehensive metabolic panel   Lipid panel   Hemoglobin A1c      Follow-up: Return in about 3 months (around 04/24/2021) for chronic follow up.  An After Visit Summary was printed and given to the patient.  I,Lauren M Auman,acting as a scribe for [name], MD.,have documented all relevant documentation on the behalf of [name], MD,as directed by  [name], MD while in the presence of [name], MD.     [name], MD [place] [phone]

## 2021-01-24 ENCOUNTER — Other Ambulatory Visit: Payer: Self-pay

## 2021-01-24 ENCOUNTER — Ambulatory Visit (INDEPENDENT_AMBULATORY_CARE_PROVIDER_SITE_OTHER): Payer: Medicare Other | Admitting: Family Medicine

## 2021-01-24 VITALS — BP 136/72 | HR 100 | Temp 96.3°F | Resp 18 | Ht 68.0 in | Wt 182.0 lb

## 2021-01-24 DIAGNOSIS — F330 Major depressive disorder, recurrent, mild: Secondary | ICD-10-CM | POA: Diagnosis not present

## 2021-01-24 DIAGNOSIS — K219 Gastro-esophageal reflux disease without esophagitis: Secondary | ICD-10-CM | POA: Diagnosis not present

## 2021-01-24 DIAGNOSIS — F410 Panic disorder [episodic paroxysmal anxiety] without agoraphobia: Secondary | ICD-10-CM

## 2021-01-24 DIAGNOSIS — M545 Low back pain, unspecified: Secondary | ICD-10-CM

## 2021-01-24 DIAGNOSIS — R7301 Impaired fasting glucose: Secondary | ICD-10-CM

## 2021-01-24 DIAGNOSIS — J438 Other emphysema: Secondary | ICD-10-CM

## 2021-01-24 DIAGNOSIS — I10 Essential (primary) hypertension: Secondary | ICD-10-CM | POA: Diagnosis not present

## 2021-01-24 DIAGNOSIS — F17219 Nicotine dependence, cigarettes, with unspecified nicotine-induced disorders: Secondary | ICD-10-CM | POA: Diagnosis not present

## 2021-01-24 DIAGNOSIS — E782 Mixed hyperlipidemia: Secondary | ICD-10-CM | POA: Diagnosis not present

## 2021-01-24 NOTE — Patient Instructions (Signed)
I know we did not discuss it today, but it would be great if you would consider quitting smoking! It is a new year!

## 2021-01-25 LAB — CBC WITH DIFFERENTIAL/PLATELET
Basophils Absolute: 0.1 10*3/uL (ref 0.0–0.2)
Basos: 1 %
EOS (ABSOLUTE): 0.2 10*3/uL (ref 0.0–0.4)
Eos: 1 %
Hematocrit: 46.4 % (ref 37.5–51.0)
Hemoglobin: 15.5 g/dL (ref 13.0–17.7)
Immature Grans (Abs): 0 10*3/uL (ref 0.0–0.1)
Immature Granulocytes: 0 %
Lymphocytes Absolute: 2.8 10*3/uL (ref 0.7–3.1)
Lymphs: 23 %
MCH: 31.6 pg (ref 26.6–33.0)
MCHC: 33.4 g/dL (ref 31.5–35.7)
MCV: 95 fL (ref 79–97)
Monocytes Absolute: 0.8 10*3/uL (ref 0.1–0.9)
Monocytes: 6 %
Neutrophils Absolute: 8.1 10*3/uL — ABNORMAL HIGH (ref 1.4–7.0)
Neutrophils: 69 %
Platelets: 286 10*3/uL (ref 150–450)
RBC: 4.91 x10E6/uL (ref 4.14–5.80)
RDW: 12.9 % (ref 11.6–15.4)
WBC: 11.9 10*3/uL — ABNORMAL HIGH (ref 3.4–10.8)

## 2021-01-25 LAB — COMPREHENSIVE METABOLIC PANEL
ALT: 20 IU/L (ref 0–44)
AST: 20 IU/L (ref 0–40)
Albumin/Globulin Ratio: 1.7 (ref 1.2–2.2)
Albumin: 4.7 g/dL (ref 3.8–4.8)
Alkaline Phosphatase: 117 IU/L (ref 44–121)
BUN/Creatinine Ratio: 8 — ABNORMAL LOW (ref 10–24)
BUN: 11 mg/dL (ref 8–27)
Bilirubin Total: 0.4 mg/dL (ref 0.0–1.2)
CO2: 26 mmol/L (ref 20–29)
Calcium: 9.3 mg/dL (ref 8.6–10.2)
Chloride: 100 mmol/L (ref 96–106)
Creatinine, Ser: 1.39 mg/dL — ABNORMAL HIGH (ref 0.76–1.27)
Globulin, Total: 2.7 g/dL (ref 1.5–4.5)
Glucose: 113 mg/dL — ABNORMAL HIGH (ref 70–99)
Potassium: 4.4 mmol/L (ref 3.5–5.2)
Sodium: 139 mmol/L (ref 134–144)
Total Protein: 7.4 g/dL (ref 6.0–8.5)
eGFR: 55 mL/min/{1.73_m2} — ABNORMAL LOW (ref >59)

## 2021-01-25 LAB — LIPID PANEL
Chol/HDL Ratio: 3.4 ratio (ref 0.0–5.0)
Cholesterol, Total: 125 mg/dL (ref 100–199)
HDL: 37 mg/dL — ABNORMAL LOW (ref >39)
LDL Chol Calc (NIH): 66 mg/dL (ref 0–99)
Triglycerides: 124 mg/dL (ref 0–149)
VLDL Cholesterol Cal: 22 mg/dL (ref 5–40)

## 2021-01-25 LAB — HEMOGLOBIN A1C
Est. average glucose Bld gHb Est-mCnc: 123 mg/dL
Hgb A1c MFr Bld: 5.9 % — ABNORMAL HIGH (ref 4.8–5.6)

## 2021-01-29 ENCOUNTER — Other Ambulatory Visit: Payer: Self-pay | Admitting: Family Medicine

## 2021-01-29 DIAGNOSIS — M19012 Primary osteoarthritis, left shoulder: Secondary | ICD-10-CM

## 2021-02-10 DIAGNOSIS — M545 Low back pain, unspecified: Secondary | ICD-10-CM | POA: Insufficient documentation

## 2021-02-10 NOTE — Assessment & Plan Note (Signed)
Recommend heat or ice.  Recommend stretching.

## 2021-02-10 NOTE — Assessment & Plan Note (Signed)
The current medical regimen is effective;  continue present plan and medications.  

## 2021-02-10 NOTE — Assessment & Plan Note (Signed)
Recommend continue to work on eating healthy diet and exercise.  

## 2021-02-10 NOTE — Assessment & Plan Note (Signed)
Recommend cessation. ?

## 2021-02-24 ENCOUNTER — Encounter: Payer: Self-pay | Admitting: Family Medicine

## 2021-02-24 NOTE — Assessment & Plan Note (Signed)
Well controlled.  ?No changes to medicines.  ?Continue to work on eating a healthy diet and exercise.  ?Labs drawn today.  ?

## 2021-03-14 ENCOUNTER — Telehealth: Payer: Self-pay

## 2021-03-14 NOTE — Progress Notes (Signed)
error 

## 2021-03-19 ENCOUNTER — Ambulatory Visit (INDEPENDENT_AMBULATORY_CARE_PROVIDER_SITE_OTHER): Payer: Medicare Other

## 2021-03-19 ENCOUNTER — Other Ambulatory Visit: Payer: Self-pay

## 2021-03-19 DIAGNOSIS — G894 Chronic pain syndrome: Secondary | ICD-10-CM

## 2021-03-19 DIAGNOSIS — I10 Essential (primary) hypertension: Secondary | ICD-10-CM

## 2021-03-19 DIAGNOSIS — E782 Mixed hyperlipidemia: Secondary | ICD-10-CM

## 2021-03-19 NOTE — Patient Instructions (Signed)
Visit Information   Goals Addressed   None    Patient Care Plan: CCM Pharmacy Care Plan     Problem Identified: hyperlipidemia, hypertension, anxiety, COPD, tobacco use   Priority: High  Onset Date: 03/27/2020     Long-Range Goal: Disease Management   Start Date: 03/27/2020  Expected End Date: 03/27/2021  Recent Progress: On track  Priority: High  Note:    Current Barriers:  Unable to independently monitor therapeutic efficacy   Pharmacist Clinical Goal(s):  Over the next 90 days, patient will achieve adherence to monitoring guidelines and medication adherence to achieve therapeutic efficacy through collaboration with PharmD and provider.      Interventions: 1:1 collaboration with Gregory Ohara, MD regarding development and update of comprehensive plan of care as evidenced by provider attestation and co-signature Inter-disciplinary care team collaboration (see longitudinal plan of care) Comprehensive medication review performed; medication list updated in electronic medical record   Hypertension (BP goal <140/90) -controlled -Current treatment:  Amlodipine 10mg  QD Appropriate, Effective, Safe, Accessible -Medications previously tried: amlodipine, lisinopril  -Current home readings:   Feb 2023: Doesn't write but normal 125-130/80-82 per patient -Current dietary habits: eats a lot of vegetables. Minimal meat.  -Current exercise habits: rides exercise bike at home.  -Denies hypotensive/hypertensive symptoms -Educated on BP goals and benefits of medications for prevention of heart attack, stroke and kidney damage; Daily salt intake goal < 2300 mg; Exercise goal of 150 minutes per week; Importance of home blood pressure monitoring; -Counseled to monitor BP at home weekly, document, and provide log at future appointments -Counseled on diet and exercise extensively Recommended to continue current medication   Hyperlipidemia: (LDL goal < 100) The ASCVD Risk score (Arnett DK,  et al., 2019) failed to calculate for the following reasons:   The valid total cholesterol range is 130 to 320 mg/dL Lab Results  Component Value Date   CHOL 125 01/24/2021   CHOL 135 10/17/2020   CHOL 129 07/16/2020   Lab Results  Component Value Date   HDL 37 (L) 01/24/2021   HDL 37 (L) 10/17/2020   HDL 39 (L) 07/16/2020   Lab Results  Component Value Date   LDLCALC 66 01/24/2021   LDLCALC 79 10/17/2020   LDLCALC 70 07/16/2020   Lab Results  Component Value Date   TRIG 124 01/24/2021   TRIG 101 10/17/2020   TRIG 107 07/16/2020   Lab Results  Component Value Date   CHOLHDL 3.4 01/24/2021   CHOLHDL 3.6 10/17/2020   CHOLHDL 3.3 07/16/2020  No results found for: LDLDIRECT -controlled -Current treatment: rosuvastatin 20 mg daily Appropriate, Effective, Safe, Accessible Aspirin 81 mg EC 2 tablets daily Appropriate, Effective, Safe, Accessible -Medications previously tried: none reported  -Current dietary patterns: eats mostly vegetables. Reports small bag of popcorn at night for supper.   -Current exercise habits:  bought an exercise bike from his neighbor recently and working to improve stamins. Using bike several times a day and walking dog outdoors.  -Educated on Cholesterol goals;  Benefits of statin for ASCVD risk reduction; Importance of limiting foods high in cholesterol; Exercise goal of 150 minutes per week; -Counseled on diet and exercise extensively Recommended to continue current medication   COPD (Goal: control symptoms and prevent exacerbations) -controlled -Current treatment  Albuterol 2 puffs every 6 hours prn wheezing or shortness of breath Appropriate, Effective, Safe, Accessible -Medications previously tried: none reported  -MMRC/CAT score: 2 -Pulmonary function testing: none available in chart -Exacerbations requiring treatment in last 6 months:  patient denies -Patient denies consistent use of maintenance inhaler -Frequency of rescue inhaler use:  uses weekly  -Counseled on Proper inhaler technique; -Counseled on diet and exercise extensively Recommended to continue current medication, patient not interested in increasing   Depression/Anxiety (Goal: manage anxiety) -controlled -Current treatment: Alprazolam 1 mg bid - Appropriate, Effective, Safe, Accessible -Medications previously tried/failed: clonazepam -PHQ9:  Depression screen [facility] 2/9 01/24/2021 10/17/2020 10/17/2020  Decreased Interest 0 1 0  Down, Depressed, Hopeless 0 0 0  PHQ - 2 Score 0 1 0  Altered sleeping - 1 -  Tired, decreased energy - 1 -  Change in appetite - 0 -  Feeling bad or failure about yourself  - 0 -  Trouble concentrating - 0 -  Moving slowly or fidgety/restless - 0 -  Suicidal thoughts - 0 -  PHQ-9 Score - 3 -  Difficult doing work/chores - Not difficult at all -  -GAD7:  GAD 7 : Generalized Anxiety Score 03/27/2020  Nervous, Anxious, on Edge 1  Control/stop worrying 1  Worry too much - different things 1  Trouble relaxing 1  Restless 1  Easily annoyed or irritable 1  Afraid - awful might happen 1  Total GAD 7 Score 7  Anxiety Difficulty Not difficult at all  -Educated on Benefits of medication for symptom control Benefits of cognitive-behavioral therapy with or without medication -Recommended to continue current medication   Tobacco use (Goal reduce smoking) -uncontrolled -Current treatment  None reported -Patient smokes Within 30 minutes of waking -Patient triggers include: drinking coffee -On a scale of 1-10, reports MOTIVATION to quit is 1 -On a scale of 1-10, reports CONFIDENCE in quitting is 1 -Previous quit attempts: Chantix (suicide) -Provided contact information for [place] Quit Line (1-800-QUIT-NOW) and encouraged patient to reach out to this group for support. -Counseled on slowly reducing amount of cigarettes daily   Pain (Goal: manage pain) -controlled -Current treatment  Tramadol 50 mg qid prn Appropriate, Effective, Safe,  Accessible -Medications previously tried: none reported  Feb 2023: Patient didn't state pain scale, but stated content on therapy. Recommended to continue current medication       Patient Goals/Self-Care Activities Over the next 90 days, patient will:  - take medications as prescribed focus on medication adherence by using pill box   Follow Up Plan: Telephone follow up appointment with care management team member scheduled for: 12/2021  Artelia Laroche, Pharm.D. - [phone]      Gregory Delgado was given information about Chronic Care Management services today including:  CCM service includes personalized support from designated clinical staff supervised by his physician, including individualized plan of care and coordination with other care providers 24/7 contact phone numbers for assistance for urgent and routine care needs. Standard insurance, coinsurance, copays and deductibles apply for chronic care management only during months in which we provide at least 20 minutes of these services. Most insurances cover these services at 100%, however patients may be responsible for any copay, coinsurance and/or deductible if applicable. This service may help you avoid the need for more expensive face-to-face services. Only one practitioner may furnish and bill the service in a calendar month. The patient may stop CCM services at any time (effective at the end of the month) by phone call to the office staff.  Patient agreed to services and verbal consent obtained.   The patient verbalized understanding of instructions, educational materials, and care plan provided today and declined offer to receive copy of patient instructions, educational materials, and  care plan.  The pharmacy team will reach out to the patient again over the next 60 days.   Zettie Pho, [facility] [phone]

## 2021-03-19 NOTE — Progress Notes (Signed)
Chronic Care Management Pharmacy Note  03/19/2021 Name:  Gregory Delgado MRN:  [id number] DOB:  1950/12/21  Plan Updates:  BP back at goal  Subjective: Gregory Delgado is an 71 y.o. year old male who is a primary patient of Cox, Kirsten, MD.  The CCM team was consulted for assistance with disease management and care coordination needs.    Engaged with patient by telephone for follow up visit in response to provider referral for pharmacy case management and/or care coordination services.   Consent to Services:  The patient was given the following information about Chronic Care Management services today, agreed to services, and gave verbal consent: 1. CCM service includes personalized support from designated clinical staff supervised by the primary care provider, including individualized plan of care and coordination with other care providers 2. 24/7 contact phone numbers for assistance for urgent and routine care needs. 3. Service will only be billed when office clinical staff spend 20 minutes or more in a month to coordinate care. 4. Only one practitioner may furnish and bill the service in a calendar month. 5.The patient may stop CCM services at any time (effective at the end of the month) by phone call to the office staff. 6. The patient will be responsible for cost sharing (co-pay) of up to 20% of the service fee (after annual deductible is met). Patient agreed to services and consent obtained.  Patient Care Team: [name], MD as PCP - General (Family Medicine) Cletis Athens, MD as Referring Physician (Otolaryngology) Lane Hacker, [facility] (Pharmacist)  Recent office visits: 07/16/20-Dr Cox PCP, Labs, pain management screen, patient not taking Amlodipine.  His bp was 98/58. His bps over the last 3 days have been 106-125/68-79.UDS: c/w xanax and tramadol. Pt also has cannibus in his system. He needs to discontinue THC by his next visit. It is not adviseable to use thc while onthese  medicines.   04/12/20-Dr. Cox PCP, HTN, Labs, Screening for colon cancer ambulatory referral to Gastroenterology, traMADol (ULTRAM) 50 MG tablet, A1c 5.8.  This is consistent with prediabetes.  Recommend patient limit sugar in his diet.Cholesterol well controlled. Sugar elevated to 120. Recommend add A1C. Liver and kidney function normal. Blood count normal. Negative for hepatitis c.    10/05/2019 - defer depression management to psychiatry. Healthy diet and exercise encouraged. Recommend COVID 19 and Flu shot.  Recent consult visits: 05/17/20-behavior health, anxiety disorder, Alprazolam reduced to twice a day,   02/24/2020 - Psychiatry visit.  11/30/2019 - Psychiatry - f/u 3 months.   Hospital visits: None in previous 6 months  Objective:  Lab Results  Component Value Date   CREATININE 1.39 (H) 01/24/2021   BUN 11 01/24/2021   GFRNONAA 67 10/05/2019   GFRAA 78 10/05/2019   NA 139 01/24/2021   K 4.4 01/24/2021   CALCIUM 9.3 01/24/2021   CO2 26 01/24/2021    Lab Results  Component Value Date/Time   HGBA1C 5.9 (H) 01/24/2021 11:13 AM   HGBA1C 6.0 (H) 07/16/2020 02:08 PM    Last diabetic Eye exam: No results found for: HMDIABEYEEXA  Last diabetic Foot exam: No results found for: HMDIABFOOTEX   Lab Results  Component Value Date   CHOL 125 01/24/2021   HDL 37 (L) 01/24/2021   LDLCALC 66 01/24/2021   TRIG 124 01/24/2021   CHOLHDL 3.4 01/24/2021    Hepatic Function Latest Ref Rng & Units 01/24/2021 11/05/2020 10/17/2020  Total Protein 6.0 - 8.5 g/dL 7.4 6.7 6.9  Albumin 3.8 - 4.8  g/dL 4.7 4.3 4.5  AST 0 - 40 IU/L $Remov'20 18 16  'udPRHN$ ALT 0 - 44 IU/L $Remov'20 29 17  'nFORIu$ Alk Phosphatase 44 - 121 IU/L 117 104 100  Total Bilirubin 0.0 - 1.2 mg/dL 0.4 0.3 0.6    Lab Results  Component Value Date/Time   TSH 3.730 04/05/2019 10:34 AM    CBC Latest Ref Rng & Units 01/24/2021 10/17/2020 07/16/2020  WBC 3.4 - 10.8 x10E3/uL 11.9(H) 9.5 10.5  Hemoglobin 13.0 - 17.7 g/dL 15.5 14.7 14.9  Hematocrit 37.5 -  51.0 % 46.4 42.7 42.2  Platelets 150 - 450 x10E3/uL 286 277 238    No results found for: VD25OH  Clinical ASCVD: No  The ASCVD Risk score (Arnett DK, et al., 2019) failed to calculate for the following reasons:   The valid total cholesterol range is 130 to 320 mg/dL    Depression screen [facility] 2/9 01/24/2021 10/17/2020 10/17/2020  Decreased Interest 0 1 0  Down, Depressed, Hopeless 0 0 0  PHQ - 2 Score 0 1 0  Altered sleeping - 1 -  Tired, decreased energy - 1 -  Change in appetite - 0 -  Feeling bad or failure about yourself  - 0 -  Trouble concentrating - 0 -  Moving slowly or fidgety/restless - 0 -  Suicidal thoughts - 0 -  PHQ-9 Score - 3 -  Difficult doing work/chores - Not difficult at all -    Social History   Tobacco Use  Smoking Status Every Day   Packs/day: 0.75   Years: 55.00   Pack years: 41.25   Types: Cigarettes  Smokeless Tobacco Never   BP Readings from Last 3 Encounters:  01/24/21 136/72  10/24/20 138/72  10/17/20 122/78   Pulse Readings from Last 3 Encounters:  01/24/21 100  10/24/20 86  10/17/20 (!) 101   Wt Readings from Last 3 Encounters:  01/24/21 182 lb (82.6 kg)  10/24/20 177 lb 12.8 oz (80.6 kg)  10/17/20 171 lb (77.6 kg)    Assessment/Interventions: Review of patient past medical history, allergies, medications, health status, including review of consultants reports, laboratory and other test data, was performed as part of comprehensive evaluation and provision of chronic care management services.   SDOH:  (Social Determinants of Health) assessments and interventions performed: Yes SDOH Interventions    Flowsheet Row Most Recent Value  SDOH Interventions   Financial Strain Interventions Intervention Not Indicated  Transportation Interventions Intervention Not Indicated       CCM Care Plan  Allergies  Allergen Reactions   Augmentin [Amoxicillin-Pot Clavulanate] Itching and Nausea And Vomiting    Medications Reviewed Today      Reviewed by Lane Hacker, [facility] (Pharmacist) on 03/19/21 at 1333  Med List Status: <None>   Medication Order Taking? Sig Documenting Provider Last Dose Status Informant  albuterol (VENTOLIN HFA) 108 (90 Base) MCG/ACT inhaler [id number]  USE 2 PUFFS INTO THE LUNGS  EVERY 6 HOURS AS NEEDED FOR WHEEZING OR SHORTNESS OF  Dia Crawford, Kirsten, MD  Active   ALPRAZolam Duanne Moron) 1 MG tablet [id number]  Take 1 mg by mouth 2 (two) times daily.  [provider]  Active            Med Note Rhae Hammock M   Wed Oct 24, 2020  4:00 PM) Prescribed by Chaya Jan  amLODipine (NORVASC) 10 MG tablet [id number]  Take 10 mg by mouth daily. [provider]  Active   Ascorbic Acid (VITAMIN C) 1000 MG  tablet [id number]  Take 1,000 mg by mouth daily. [provider]  Active   aspirin 81 MG EC tablet [id number]  Take 162 mg by mouth daily at 10 pm. [provider]  Active   cholecalciferol (VITAMIN D3) 25 MCG (1000 UNIT) tablet [id number]  Take 1,000 Units by mouth daily. [provider]  Active   docusate sodium (COLACE) 100 MG capsule [id number]  Take 100 mg by mouth daily. [provider]  Active   Multiple Vitamins-Minerals (CENTRUM SILVER 50+MEN) TABS [id number]  Take by mouth. 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   Impaired fasting glucose 04/10/2019   Screening for prostate cancer 04/10/2019   Essential hypertension, benign 04/04/2019   Mixed hyperlipidemia 04/04/2019   Cigarette  nicotine dependence with nicotine-induced disorder 04/04/2019   GERD without esophagitis 04/04/2019   Cervical radicular pain 04/04/2019   Panic attack 11/18/2016   Long term current use of opiate analgesic 02/20/2012   Pain syndrome, chronic 02/20/2012   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  Conditions to be addressed/monitored:  Hypertension, Hyperlipidemia, GERD, COPD, Anxiety, impaired fasting glucose and pain  Care Plan : CCM Pharmacy Care Plan  Updates made by Lane Hacker, [place] since 03/19/2021 12:00 AM     Problem: hyperlipidemia, hypertension, anxiety, COPD, tobacco use   Priority: High  Onset Date: 03/27/2020     Long-Range Goal: Disease Management   Start Date: 03/27/2020  Expected End Date: 03/27/2021  Recent Progress: On track  Priority: High  Note:    Current Barriers:  Unable to independently monitor therapeutic efficacy   Pharmacist Clinical Goal(s):  Over the next 90 days, patient will achieve adherence to monitoring guidelines and medication adherence to achieve therapeutic efficacy through collaboration with PharmD and provider.      Interventions: 1:1 collaboration with [name], MD regarding development and update of comprehensive plan of care as evidenced by provider attestation and co-signature Inter-disciplinary care team collaboration (see longitudinal plan of care) Comprehensive medication review performed; medication list updated in electronic medical record   Hypertension (BP goal <140/90) -controlled -Current treatment:  Amlodipine $RemoveBef'10mg'GnqddhtZVl$  QD Appropriate, Effective, Safe, Accessible -Medications previously tried: amlodipine, lisinopril  -Current home readings:   Feb 2023: Doesn't write but normal 125-130/80-82 per patient -Current dietary habits: eats a lot of vegetables. Minimal meat.  -Current  exercise habits: rides exercise bike at home.  -Denies hypotensive/hypertensive symptoms -Educated on BP goals and benefits of medications for prevention of heart attack, stroke and kidney damage; Daily salt intake goal < 2300 mg; Exercise goal of 150 minutes per week; Importance of home blood pressure monitoring; -Counseled to monitor BP at home weekly, document, and provide log at future appointments -Counseled on diet and exercise extensively Recommended to continue current medication   Hyperlipidemia: (  LDL goal < 100) The ASCVD Risk score (Arnett DK, et al., 2019) failed to calculate for the following reasons:   The valid total cholesterol range is 130 to 320 mg/dL Lab Results  Component Value Date   CHOL 125 01/24/2021   CHOL 135 10/17/2020   CHOL 129 07/16/2020   Lab Results  Component Value Date   HDL 37 (L) 01/24/2021   HDL 37 (L) 10/17/2020   HDL 39 (L) 07/16/2020   Lab Results  Component Value Date   LDLCALC 66 01/24/2021   LDLCALC 79 10/17/2020   LDLCALC 70 07/16/2020   Lab Results  Component Value Date   TRIG 124 01/24/2021   TRIG 101 10/17/2020   TRIG 107 07/16/2020   Lab Results  Component Value Date   CHOLHDL 3.4 01/24/2021   CHOLHDL 3.6 10/17/2020   CHOLHDL 3.3 07/16/2020  No results found for: LDLDIRECT -controlled -Current treatment: rosuvastatin 20 mg daily Appropriate, Effective, Safe, Accessible Aspirin 81 mg EC 2 tablets daily Appropriate, Effective, Safe, Accessible -Medications previously tried: none reported  -Current dietary patterns: eats mostly vegetables. Reports small bag of popcorn at night for supper.   -Current exercise habits:  bought an exercise bike from his neighbor recently and working to improve stamins. Using bike several times a day and walking dog outdoors.  -Educated on Cholesterol goals;  Benefits of statin for ASCVD risk reduction; Importance of limiting foods high in cholesterol; Exercise goal of 150 minutes per  week; -Counseled on diet and exercise extensively Recommended to continue current medication   COPD (Goal: control symptoms and prevent exacerbations) -controlled -Current treatment  Albuterol 2 puffs every 6 hours prn wheezing or shortness of breath Appropriate, Effective, Safe, Accessible -Medications previously tried: none reported  -MMRC/CAT score: 2 -Pulmonary function testing: none available in chart -Exacerbations requiring treatment in last 6 months: patient denies -Patient denies consistent use of maintenance inhaler -Frequency of rescue inhaler use: uses weekly  -Counseled on Proper inhaler technique; -Counseled on diet and exercise extensively Recommended to continue current medication, patient not interested in increasing   Depression/Anxiety (Goal: manage anxiety) -controlled -Current treatment: Alprazolam 1 mg bid - Appropriate, Effective, Safe, Accessible -Medications previously tried/failed: clonazepam -PHQ9:  Depression screen [facility] 2/9 01/24/2021 10/17/2020 10/17/2020  Decreased Interest 0 1 0  Down, Depressed, Hopeless 0 0 0  PHQ - 2 Score 0 1 0  Altered sleeping - 1 -  Tired, decreased energy - 1 -  Change in appetite - 0 -  Feeling bad or failure about yourself  - 0 -  Trouble concentrating - 0 -  Moving slowly or fidgety/restless - 0 -  Suicidal thoughts - 0 -  PHQ-9 Score - 3 -  Difficult doing work/chores - Not difficult at all -  -GAD7:  GAD 7 : Generalized Anxiety Score 03/27/2020  Nervous, Anxious, on Edge 1  Control/stop worrying 1  Worry too much - different things 1  Trouble relaxing 1  Restless 1  Easily annoyed or irritable 1  Afraid - awful might happen 1  Total GAD 7 Score 7  Anxiety Difficulty Not difficult at all  -Educated on Benefits of medication for symptom control Benefits of cognitive-behavioral therapy with or without medication -Recommended to continue current medication   Tobacco use (Goal reduce  smoking) -uncontrolled -Current treatment  None reported -Patient smokes Within 30 minutes of waking -Patient triggers include: drinking coffee -On a scale of 1-10, reports MOTIVATION to quit is 1 -On a scale of 1-10, reports  CONFIDENCE in quitting is 1 -Previous quit attempts: Chantix (suicide) -Provided contact information for [place] Quit Line (1-800-QUIT-NOW) and encouraged patient to reach out to this group for support. -Counseled on slowly reducing amount of cigarettes daily   Pain (Goal: manage pain) -controlled -Current treatment  Tramadol 50 mg qid prn Appropriate, Effective, Safe, Accessible -Medications previously tried: none reported  Feb 2023: Patient didn't state pain scale, but stated content on therapy. Recommended to continue current medication       Patient Goals/Self-Care Activities Over the next 90 days, patient will:  - take medications as prescribed focus on medication adherence by using pill box   Follow Up Plan: Telephone follow up appointment with care management team member scheduled for: 12/2021  [place] Constable, Pharm.D. - [phone]        Medication Assistance: None required.  Patient affirms current coverage meets needs.  Patient's preferred pharmacy is:  [employer] ([place], [facility] [address] [address] Phone: [phone] Fax: [phone]  [id number] [address], [address] [place] [address] Phone: [phone] Fax: [phone]  [facility] - [place], [address] [place] [address] Phone: [phone] Fax: [phone]   Uses pill box? Yes Pt endorses good compliance  We discussed: Current pharmacy is preferred with insurance plan and patient is satisfied with pharmacy services Patient decided to:  Continue current medication management strategy  Care Plan and Follow Up Patient Decision:  Patient agrees to Care Plan and Follow-up.  Plan: Telephone follow up appointment with care management team member scheduled for:  November 2023 .   [place] Constable, Pharm.D. - [phone]

## 2021-04-06 ENCOUNTER — Other Ambulatory Visit: Payer: Self-pay | Admitting: Family Medicine

## 2021-04-09 DIAGNOSIS — E782 Mixed hyperlipidemia: Secondary | ICD-10-CM | POA: Diagnosis not present

## 2021-04-09 DIAGNOSIS — I10 Essential (primary) hypertension: Secondary | ICD-10-CM | POA: Diagnosis not present

## 2021-04-29 NOTE — Progress Notes (Signed)
? ?Subjective:  ?Patient ID: Gregory Delgado, male    DOB: 04-Jun-1950  Age: 71 y.o. MRN: [id number] ? ?Chief Complaint  ?Patient presents with  ? Hypertension  ? Hyperlipidemia  ? ?HPI: ?Hyperlipidemia:  ?Patient is currently taking Rosuvastatin 20 mg take 1 tablet daily. ? ?Hypertension: ?Patient stopped his amlodipine 2 months ago when his bp was running a little low.   ?BP runs in 120-130s/70-80.  ? ?GERD: Patient is currently taking Pantoprazole 40 mg take 1 tablet twice daily. ? ?Left shoulder and  lumbar back pain. Taking tramadol 50 mg 2 oral twice daily.  ? Patient has never had a steroid injection. ? ?Left shoulder mri (2014)  ?IMPRESSION:  ?1.  Subacute fracture of the greater tuberosity with no significant  ?displacement.  Healing has  begun.  ?2.  Intact rotator cuff.  ?3.  Small amount of blood in the joint and in the subdeltoid bursa.  ?4.  Probable partial tear of the anterior aspect of the inferior  ?glenohumeral ligament.  ? ?Current Outpatient Medications on File Prior to Visit  ?Medication Sig Dispense Refill  ? albuterol (VENTOLIN HFA) 108 (90 Base) MCG/ACT inhaler USE 2 INHALATIONS BY MOUTH  INTO THE LUNGS EVERY 6  HOURS AS NEEDED FOR  WHEEZING OR SHORTNESS OF  BREATH 26.8 g 3  ? ALPRAZolam (XANAX) 1 MG tablet Take 1 mg by mouth 2 (two) times daily.     ? Ascorbic Acid (VITAMIN C) 1000 MG tablet Take 1,000 mg by mouth daily.    ? aspirin 81 MG EC tablet Take 162 mg by mouth daily at 10 pm.    ? cholecalciferol (VITAMIN D3) 25 MCG (1000 UNIT) tablet Take 1,000 Units by mouth daily.    ? docusate sodium (COLACE) 100 MG capsule Take 100 mg by mouth daily.    ? Multiple Vitamins-Minerals (CENTRUM SILVER 50+MEN) TABS Take by mouth.    ? pantoprazole (PROTONIX) 40 MG tablet TAKE 1 TABLET BY MOUTH  TWICE DAILY 180 tablet 3  ? rosuvastatin (CRESTOR) 20 MG tablet TAKE 1 TABLET BY MOUTH  DAILY 90 tablet 3  ? amLODipine (NORVASC) 10 MG tablet Take 10 mg by mouth daily. (Patient not taking: Reported on  04/30/2021)    ? ?No current facility-administered medications on file prior to visit.  ? ?Past Medical History:  ?Diagnosis Date  ? COPD (chronic obstructive pulmonary disease) (HCC)   ? GERD (gastroesophageal reflux disease)   ? Hyperlipidemia   ? Hypertension   ? Osteoarthritis   ? ?Past Surgical History:  ?Procedure Laterality Date  ? CERVICAL DISC SURGERY  2015  ? C2-C4   ? KNEE SURGERY  1976  ?  ?Family History  ?Problem Relation Age of Onset  ? Diabetes Mother   ? Alcoholism Father   ? CVA Sister   ? Congenital heart disease Daughter   ? Rectal cancer Son   ? Cirrhosis Son   ? ?Social History  ? ?Socioeconomic History  ? Marital status: Single  ?  Spouse name: Not on file  ? Number of children: Not on file  ? Years of education: Not on file  ? Highest education level: Not on file  ?Occupational History  ? Occupation: Retired Naval architect  ?Tobacco Use  ? Smoking status: Every Day  ?  Packs/day: 0.75  ?  Years: 55.00  ?  Pack years: 41.25  ?  Types: Cigarettes  ? Smokeless tobacco: Never  ?Vaping Use  ? Vaping Use: Never used  ?Substance  and Sexual Activity  ? Alcohol use: Never  ? Drug use: Never  ? Sexual activity: Not on file  ?Other Topics Concern  ? Not on file  ?Social History Narrative  ? Not on file  ? ?Social Determinants of Health  ? ?Financial Resource Strain: Not on file  ?Food Insecurity: Not on file  ?Transportation Needs: No Transportation Needs  ? Lack of Transportation (Medical): No  ? Lack of Transportation (Non-Medical): No  ?Physical Activity: Not on file  ?Stress: Not on file  ?Social Connections: Socially Isolated  ? Frequency of Communication with Friends and Family: Three times a week  ? Frequency of Social Gatherings with Friends and Family: Three times a week  ? Attends Religious Services: Never  ? Active Member of Clubs or Organizations: No  ? Attends Banker Meetings: Never  ? Marital Status: Divorced  ? ? ?Review of Systems  ?Constitutional:  Positive for fatigue.  Negative for appetite change and fever.  ?HENT:  Negative for congestion, ear pain, sinus pressure and sore throat.   ?Respiratory:  Negative for cough, chest tightness, shortness of breath and wheezing.   ?Cardiovascular:  Negative for chest pain and palpitations.  ?Gastrointestinal:  Positive for nausea and vomiting. Negative for abdominal pain, constipation and diarrhea.  ?Genitourinary:  Negative for dysuria and hematuria.  ?Musculoskeletal:  Positive for arthralgias (left shoulder pain) and back pain (low back pain). Negative for joint swelling and myalgias.  ?Skin:  Negative for rash.  ?Neurological:  Negative for dizziness, weakness and headaches.  ?Psychiatric/Behavioral:  Negative for dysphoric mood. The patient is not nervous/anxious.   ? ? ?Objective:  ?BP 130/80   Pulse 88   Temp (!) 97.3 ?F (36.3 ?C)   Resp 16   Ht 5\' 8"  (1.727 m)   Wt 180 lb (81.6 kg)   BMI 27.37 kg/m?  ? ?BP/Weight 04/30/2021 01/24/2021 10/24/2020  ?Systolic BP 130 136 138  ?Diastolic BP 80 72 72  ?Wt. (Lbs) 180 182 177.8  ?BMI 27.37 27.67 27.03  ? ? ?Physical Exam ?Vitals reviewed.  ?Constitutional:   ?   Appearance: Normal appearance. He is normal weight.  ?Cardiovascular:  ?   Rate and Rhythm: Normal rate and regular rhythm.  ?   Heart sounds: Normal heart sounds.  ?Pulmonary:  ?   Effort: Pulmonary effort is normal. No respiratory distress.  ?   Breath sounds: Normal breath sounds. No wheezing, rhonchi or rales.  ?Abdominal:  ?   General: Bowel sounds are normal.  ?   Palpations: Abdomen is soft.  ?   Tenderness: There is no abdominal tenderness.  ?Musculoskeletal:     ?   General: Tenderness (anterior left shoulder) present. Normal range of motion.  ?Neurological:  ?   Mental Status: He is alert and oriented to person, place, and time.  ?Psychiatric:     ?   Mood and Affect: Mood normal.     ?   Behavior: Behavior normal.  ? ? ?Diabetic Foot Exam - Simple   ?No data filed ?  ?  ? ?Lab Results  ?Component Value Date  ? WBC  11.9 (H) 01/24/2021  ? HGB 15.5 01/24/2021  ? HCT 46.4 01/24/2021  ? PLT 286 01/24/2021  ? GLUCOSE 113 (H) 01/24/2021  ? CHOL 125 01/24/2021  ? TRIG 124 01/24/2021  ? HDL 37 (L) 01/24/2021  ? LDLCALC 66 01/24/2021  ? ALT 20 01/24/2021  ? AST 20 01/24/2021  ? NA 139 01/24/2021  ?  K 4.4 01/24/2021  ? CL 100 01/24/2021  ? CREATININE 1.39 (H) 01/24/2021  ? BUN 11 01/24/2021  ? CO2 26 01/24/2021  ? TSH 3.730 04/05/2019  ? HGBA1C 5.9 (H) 01/24/2021  ? ? ? ? ?Assessment & Plan:  ? ?Problem List Items Addressed This Visit   ? ?  ? Cardiovascular and Mediastinum  ? Essential hypertension, benign  ?  Remain off amlodipine. Well controlled without medicine. ?  ?  ? Relevant Orders  ? CBC With Diff/Platelet  ? Comprehensive metabolic panel  ?  ? Digestive  ? GERD without esophagitis  ?  The current medical regimen is effective;  continue present plan and medications. ? ?  ?  ?  ? Endocrine  ? Impaired fasting glucose  ?  Recommend continue to work on eating healthy diet and exercise. ? ?  ?  ? Relevant Orders  ? Hemoglobin A1c  ?  ? Nervous and Auditory  ? Cigarette nicotine dependence with nicotine-induced disorder  ?  Recommend cessation. ?  ?  ?  ? Other  ? Mixed hyperlipidemia - Primary  ?  Well controlled.  ?No changes to medicines.  ?Continue to work on eating a healthy diet and exercise.  ?Labs drawn today.  ? ?  ?  ? Relevant Orders  ? Lipid panel  ? Mild recurrent major depression (HCC)  ?  The current medical regimen is effective;  continue present plan and medications. ? ?  ?  ? Chronic left shoulder pain  ?  Risks were discussed including bleeding, infection, increase in sugars if diabetic, atrophy at site of injection, and increased pain.  After consent was obtained, using sterile technique the knee was prepped with alcohol.  The joint was entered and Kenalog 80 mg and 5 ml plain Lidocaine was then injected and the needle withdrawn.  The procedure was well tolerated.   ?The patient is asked to continue to rest the  joint for a few more days before resuming regular activities.  It may be more painful for the first 1-2 days.  Watch for fever, or increased swelling or persistent pain in the joint. Call or return to clinic prn if such

## 2021-04-30 ENCOUNTER — Other Ambulatory Visit: Payer: Self-pay

## 2021-04-30 ENCOUNTER — Encounter: Payer: Self-pay | Admitting: Family Medicine

## 2021-04-30 ENCOUNTER — Ambulatory Visit (INDEPENDENT_AMBULATORY_CARE_PROVIDER_SITE_OTHER): Payer: Medicare Other | Admitting: Family Medicine

## 2021-04-30 VITALS — BP 130/80 | HR 88 | Temp 97.3°F | Resp 16 | Ht 68.0 in | Wt 180.0 lb

## 2021-04-30 DIAGNOSIS — M25512 Pain in left shoulder: Secondary | ICD-10-CM

## 2021-04-30 DIAGNOSIS — I10 Essential (primary) hypertension: Secondary | ICD-10-CM

## 2021-04-30 DIAGNOSIS — K219 Gastro-esophageal reflux disease without esophagitis: Secondary | ICD-10-CM | POA: Diagnosis not present

## 2021-04-30 DIAGNOSIS — E782 Mixed hyperlipidemia: Secondary | ICD-10-CM

## 2021-04-30 DIAGNOSIS — F330 Major depressive disorder, recurrent, mild: Secondary | ICD-10-CM | POA: Diagnosis not present

## 2021-04-30 DIAGNOSIS — R7301 Impaired fasting glucose: Secondary | ICD-10-CM

## 2021-04-30 DIAGNOSIS — F17219 Nicotine dependence, cigarettes, with unspecified nicotine-induced disorders: Secondary | ICD-10-CM | POA: Diagnosis not present

## 2021-04-30 DIAGNOSIS — G8929 Other chronic pain: Secondary | ICD-10-CM | POA: Diagnosis not present

## 2021-04-30 MED ORDER — TRIAMCINOLONE ACETONIDE 40 MG/ML IJ SUSP
40.0000 mg | Freq: Once | INTRAMUSCULAR | Status: AC
  Administered 2021-04-30: 40 mg via INTRA_ARTICULAR

## 2021-04-30 MED ORDER — TRAMADOL HCL 50 MG PO TABS: ORAL_TABLET | ORAL | 5 refills | Status: DC

## 2021-04-30 NOTE — Assessment & Plan Note (Signed)
The current medical regimen is effective;  continue present plan and medications.  

## 2021-04-30 NOTE — Assessment & Plan Note (Signed)
Remain off amlodipine. Well controlled without medicine. ?

## 2021-04-30 NOTE — Assessment & Plan Note (Signed)
Recommend continue to work on eating healthy diet and exercise.  

## 2021-04-30 NOTE — Assessment & Plan Note (Signed)
Recommend cessation. ?

## 2021-04-30 NOTE — Assessment & Plan Note (Signed)
Risks were discussed including bleeding, infection, increase in sugars if diabetic, atrophy at site of injection, and increased pain.  After consent was obtained, using sterile technique the knee was prepped with alcohol.  The joint was entered and Kenalog 80 mg and 5 ml plain Lidocaine was then injected and the needle withdrawn.  The procedure was well tolerated.   The patient is asked to continue to rest the joint for a few more days before resuming regular activities.  It may be more painful for the first 1-2 days.  Watch for fever, or increased swelling or persistent pain in the joint. Call or return to clinic prn if such symptoms occur or there is failure to improve as anticipated. 

## 2021-04-30 NOTE — Assessment & Plan Note (Signed)
Well controlled.  ?No changes to medicines.  ?Continue to work on eating a healthy diet and exercise.  ?Labs drawn today.  ?

## 2021-05-01 LAB — CBC WITH DIFF/PLATELET
Basophils Absolute: 0.1 10*3/uL (ref 0.0–0.2)
Basos: 1 %
EOS (ABSOLUTE): 0.1 10*3/uL (ref 0.0–0.4)
Eos: 1 %
Hematocrit: 41.5 % (ref 37.5–51.0)
Hemoglobin: 15.2 g/dL (ref 13.0–17.7)
Immature Grans (Abs): 0 10*3/uL (ref 0.0–0.1)
Immature Granulocytes: 0 %
Lymphocytes Absolute: 2.2 10*3/uL (ref 0.7–3.1)
Lymphs: 18 %
MCH: 34.3 pg — ABNORMAL HIGH (ref 26.6–33.0)
MCHC: 36.6 g/dL — ABNORMAL HIGH (ref 31.5–35.7)
MCV: 94 fL (ref 79–97)
Monocytes Absolute: 0.7 10*3/uL (ref 0.1–0.9)
Monocytes: 6 %
Neutrophils Absolute: 9.1 10*3/uL — ABNORMAL HIGH (ref 1.4–7.0)
Neutrophils: 74 %
Platelets: 274 10*3/uL (ref 150–450)
RBC: 4.43 x10E6/uL (ref 4.14–5.80)
RDW: 13.4 % (ref 11.6–15.4)
WBC: 12.2 10*3/uL — ABNORMAL HIGH (ref 3.4–10.8)

## 2021-05-01 LAB — COMPREHENSIVE METABOLIC PANEL
ALT: 16 IU/L (ref 0–44)
AST: 19 IU/L (ref 0–40)
Albumin/Globulin Ratio: 1.8 (ref 1.2–2.2)
Albumin: 4.7 g/dL (ref 3.8–4.8)
Alkaline Phosphatase: 129 IU/L — ABNORMAL HIGH (ref 44–121)
BUN/Creatinine Ratio: 10 (ref 10–24)
BUN: 13 mg/dL (ref 8–27)
Bilirubin Total: 0.5 mg/dL (ref 0.0–1.2)
CO2: 23 mmol/L (ref 20–29)
Calcium: 10 mg/dL (ref 8.6–10.2)
Chloride: 103 mmol/L (ref 96–106)
Creatinine, Ser: 1.3 mg/dL — ABNORMAL HIGH (ref 0.76–1.27)
Globulin, Total: 2.6 g/dL (ref 1.5–4.5)
Glucose: 114 mg/dL — ABNORMAL HIGH (ref 70–99)
Potassium: 5 mmol/L (ref 3.5–5.2)
Sodium: 145 mmol/L — ABNORMAL HIGH (ref 134–144)
Total Protein: 7.3 g/dL (ref 6.0–8.5)
eGFR: 60 mL/min/{1.73_m2} (ref >59)

## 2021-05-01 LAB — HEMOGLOBIN A1C
Est. average glucose Bld gHb Est-mCnc: 117 mg/dL
Hgb A1c MFr Bld: 5.7 % — ABNORMAL HIGH (ref 4.8–5.6)

## 2021-05-01 LAB — LIPID PANEL
Chol/HDL Ratio: 3.9 ratio (ref 0.0–5.0)
Cholesterol, Total: 132 mg/dL (ref 100–199)
HDL: 34 mg/dL — ABNORMAL LOW (ref >39)
LDL Chol Calc (NIH): 67 mg/dL (ref 0–99)
Triglycerides: 186 mg/dL — ABNORMAL HIGH (ref 0–149)
VLDL Cholesterol Cal: 31 mg/dL (ref 5–40)

## 2021-06-04 ENCOUNTER — Telehealth: Payer: Self-pay

## 2021-06-04 NOTE — Chronic Care Management (AMB) (Signed)
? ? ?Chronic Care Management ?Pharmacy Assistant  ? ?Name: Gregory Delgado  MRN: [id number] DOB: May 30, 1950 ? ?Reason for Encounter: Disease State/ Hypertension ? ?Recent office visits:  ?04-30-2021 CoxElnita Maxwell, MD. WBC= 12.2, MCH= 34.3, MCHC= 36.6. Glucose= 114, Creatinine= 1.30, Sodium= 145, Alkaline= 129. A1C= 5.7. Trig= 186, HDL= 34. CHANGE tramadol 50 mg 4 times daily as needed TO 50 mg 2 tablets twice daily. Kenelog injection given. ? ?Recent consult visits:  ?None ? ?Hospital visits:  ?None in previous 6 months ? ?Medications: ?Outpatient Encounter Medications as of 06/04/2021  ?Medication Sig Note  ? albuterol (VENTOLIN HFA) 108 (90 Base) MCG/ACT inhaler USE 2 INHALATIONS BY MOUTH  INTO THE LUNGS EVERY 6  HOURS AS NEEDED FOR  WHEEZING OR SHORTNESS OF  BREATH   ? ALPRAZolam (XANAX) 1 MG tablet Take 1 mg by mouth 2 (two) times daily.  10/24/2020: Prescribed by Chaya Jan  ? amLODipine (NORVASC) 10 MG tablet Take 10 mg by mouth daily. (Patient not taking: Reported on 04/30/2021)   ? Ascorbic Acid (VITAMIN C) 1000 MG tablet Take 1,000 mg by mouth daily.   ? aspirin 81 MG EC tablet Take 162 mg by mouth daily at 10 pm.   ? cholecalciferol (VITAMIN D3) 25 MCG (1000 UNIT) tablet Take 1,000 Units by mouth daily.   ? docusate sodium (COLACE) 100 MG capsule Take 100 mg by mouth daily.   ? Multiple Vitamins-Minerals (CENTRUM SILVER 50+MEN) TABS Take by mouth.   ? pantoprazole (PROTONIX) 40 MG tablet TAKE 1 TABLET BY MOUTH  TWICE DAILY   ? rosuvastatin (CRESTOR) 20 MG tablet TAKE 1 TABLET BY MOUTH  DAILY   ? traMADol (ULTRAM) 50 MG tablet 2 TABLETS TWICE DAILY   ? ?No facility-administered encounter medications on file as of 06/04/2021.  ? ?Recent Office Vitals: ?BP Readings from Last 3 Encounters:  ?04/30/21 130/80  ?01/24/21 136/72  ?10/24/20 138/72  ? ?Pulse Readings from Last 3 Encounters:  ?04/30/21 88  ?01/24/21 100  ?10/24/20 86  ?  ?Wt Readings from Last 3 Encounters:  ?04/30/21 180 lb (81.6 kg)  ?01/24/21 182  lb (82.6 kg)  ?10/24/20 177 lb 12.8 oz (80.6 kg)  ?  ? ?Kidney Function ?Lab Results  ?Component Value Date/Time  ? CREATININE 1.30 (H) 04/30/2021 11:28 AM  ? CREATININE 1.39 (H) 01/24/2021 11:12 AM  ? GFRNONAA 67 10/05/2019 11:47 AM  ? GFRAA 78 10/05/2019 11:47 AM  ? ? ? ?  Latest Ref Rng & Units 04/30/2021  ? 11:28 AM 01/24/2021  ? 11:12 AM 11/05/2020  ?  4:12 PM  ?BMP  ?Glucose 70 - 99 mg/dL 114   113   98    ?BUN 8 - 27 mg/dL 13   11   8     ?Creatinine 0.76 - 1.27 mg/dL 1.30   1.39   1.14    ?BUN/Creat Ratio 10 - 24 10   8   7     ?Sodium 134 - 144 mmol/L 145   139   140    ?Potassium 3.5 - 5.2 mmol/L 5.0   4.4   4.1    ?Chloride 96 - 106 mmol/L 103   100   100    ?CO2 20 - 29 mmol/L 23   26   24     ?Calcium 8.6 - 10.2 mg/dL 10.0   9.3   9.1    ? ? ?06-04-2021: 1st attempt left VM ?06-06-2021: 2nd attempt left VM ?06-07-2021: 3rd attempt left VM ? ?Care Gaps: ?  Last annual wellness visit? 10-24-2020 ?Tdap overdue ? ?Star Rating Drugs: ?Rosuvastatin 20 mg- Last filled 04-29-2021 90 DS ? ?Malecca Hicks CMA ?Clinical Pharmacist Assistant ?[phone] ? ?

## 2021-06-23 ENCOUNTER — Other Ambulatory Visit: Payer: Self-pay | Admitting: Family Medicine

## 2021-06-23 DIAGNOSIS — K219 Gastro-esophageal reflux disease without esophagitis: Secondary | ICD-10-CM

## 2021-06-23 DIAGNOSIS — E782 Mixed hyperlipidemia: Secondary | ICD-10-CM

## 2021-06-24 NOTE — Telephone Encounter (Signed)
Refill sent to pharmacy.   

## 2021-07-26 ENCOUNTER — Telehealth: Payer: Self-pay

## 2021-07-26 NOTE — Chronic Care Management (AMB) (Signed)
Chronic Care Management Pharmacy Assistant   Name: Gregory Delgado  MRN: [id number] DOB: March 06, 1950  Reason for Encounter: Disease State/ Hypertension  Recent office visits:  04-30-2021 Blane Ohara, MD. WBC= 12.2, MCH= 34.3, MCHC= 36.6. Glucose= 114, Creatinine= 1.30, Sodium= 145, Alkaline= 129. A1C= 5.7. Trig= 186, HDL= 34. CHANGE tramadol 50 mg 4 times daily as needed TO 50 mg 2 tablets twice daily. Kenelog injection given.  Recent consult visits:  05-14-2021 Roanna Epley, NP (Behavioral health). Follow up visit no changes.  Hospital visits:  None in previous 6 months  Medications: Outpatient Encounter Medications as of 07/26/2021  Medication Sig Note   albuterol (VENTOLIN HFA) 108 (90 Base) MCG/ACT inhaler USE 2 INHALATIONS BY MOUTH  INTO THE LUNGS EVERY 6  HOURS AS NEEDED FOR  WHEEZING OR SHORTNESS OF  BREATH    ALPRAZolam (XANAX) 1 MG tablet Take 1 mg by mouth 2 (two) times daily.  10/24/2020: Prescribed by Barbette Merino   amLODipine (NORVASC) 10 MG tablet Take 10 mg by mouth daily. (Patient not taking: Reported on 04/30/2021)    Ascorbic Acid (VITAMIN C) 1000 MG tablet Take 1,000 mg by mouth daily.    aspirin 81 MG EC tablet Take 162 mg by mouth daily at 10 pm.    cholecalciferol (VITAMIN D3) 25 MCG (1000 UNIT) tablet Take 1,000 Units by mouth daily.    docusate sodium (COLACE) 100 MG capsule Take 100 mg by mouth daily.    Multiple Vitamins-Minerals (CENTRUM SILVER 50+MEN) TABS Take by mouth.    pantoprazole (PROTONIX) 40 MG tablet TAKE 1 TABLET BY MOUTH  TWICE DAILY    rosuvastatin (CRESTOR) 20 MG tablet TAKE 1 TABLET BY MOUTH  DAILY    traMADol (ULTRAM) 50 MG tablet 2 TABLETS TWICE DAILY    No facility-administered encounter medications on file as of 07/26/2021.    Recent Office Vitals: BP Readings from Last 3 Encounters:  04/30/21 130/80  01/24/21 136/72  10/24/20 138/72   Pulse Readings from Last 3 Encounters:  04/30/21 88  01/24/21 100  10/24/20 86     Wt Readings from Last 3 Encounters:  04/30/21 180 lb (81.6 kg)  01/24/21 182 lb (82.6 kg)  10/24/20 177 lb 12.8 oz (80.6 kg)     Kidney Function Lab Results  Component Value Date/Time   CREATININE 1.30 (H) 04/30/2021 11:28 AM   CREATININE 1.39 (H) 01/24/2021 11:12 AM   GFRNONAA 67 10/05/2019 11:47 AM   GFRAA 78 10/05/2019 11:47 AM       Latest Ref Rng & Units 04/30/2021   11:28 AM 01/24/2021   11:12 AM 11/05/2020    4:12 PM  BMP  Glucose 70 - 99 mg/dL 409  811  98   BUN 8 - 27 mg/dL 13  11  8    Creatinine 0.76 - 1.27 mg/dL 9.14  7.82  9.56   BUN/Creat Ratio 10 - 24 10  8  7    Sodium 134 - 144 mmol/L 145  139  140   Potassium 3.5 - 5.2 mmol/L 5.0  4.4  4.1   Chloride 96 - 106 mmol/L 103  100  100   CO2 20 - 29 mmol/L 23  26  24    Calcium 8.6 - 10.2 mg/dL 21.3  9.3  9.1      [id number]: 1st attempt left VM 07-30-2021: 2nd attempt left VM 07-31-2021: 3rd attempt left VM  Care Gaps: Last annual wellness visit? 10-24-2020 Tdap overdue  Star Rating Drugs: Rosuvastatin 20 mg-  Last filled 07-16-2021 100 DS  Huey Romans [facility] Clinical Pharmacist Assistant [phone]

## 2021-08-30 DIAGNOSIS — H6123 Impacted cerumen, bilateral: Secondary | ICD-10-CM | POA: Diagnosis not present

## 2021-08-30 DIAGNOSIS — H60543 Acute eczematoid otitis externa, bilateral: Secondary | ICD-10-CM | POA: Diagnosis not present

## 2021-09-03 ENCOUNTER — Telehealth: Payer: Self-pay

## 2021-09-03 NOTE — Chronic Care Management (AMB) (Unsigned)
Chronic Care Management Pharmacy Assistant   Name: Gregory Delgado  MRN: [id number] DOB: 1950-02-18  Reason for Encounter: Disease State/ Hypertension   Recent office visits:  04-30-2021 Blane Ohara, MD. WBC= 12.2, MCH= 34.3, MCHC= 36.6. Glucose= 114, Creatinine= 1.30, Sodium= 145, Alkaline= 129. A1C= 5.7. Trig= 186, HDL= 34. CHANGE tramadol 50 mg 4 times daily as needed TO 50 mg 2 tablets twice daily. Kenelog injection given.  Recent consult visits:  08-07-2021 Roanna Epley, NP (Behavioral health). Visit for panic attack.  05-14-2021 Roanna Epley, NP (Behavioral health). Follow up visit no changes.   Hospital visits:  None in previous 6 months  Medications: Outpatient Encounter Medications as of 09/03/2021  Medication Sig Note   albuterol (VENTOLIN HFA) 108 (90 Base) MCG/ACT inhaler USE 2 INHALATIONS BY MOUTH  INTO THE LUNGS EVERY 6  HOURS AS NEEDED FOR  WHEEZING OR SHORTNESS OF  BREATH    ALPRAZolam (XANAX) 1 MG tablet Take 1 mg by mouth 2 (two) times daily.  10/24/2020: Prescribed by Barbette Merino   amLODipine (NORVASC) 10 MG tablet Take 10 mg by mouth daily. (Patient not taking: Reported on 04/30/2021)    Ascorbic Acid (VITAMIN C) 1000 MG tablet Take 1,000 mg by mouth daily.    aspirin 81 MG EC tablet Take 162 mg by mouth daily at 10 pm.    cholecalciferol (VITAMIN D3) 25 MCG (1000 UNIT) tablet Take 1,000 Units by mouth daily.    docusate sodium (COLACE) 100 MG capsule Take 100 mg by mouth daily.    Multiple Vitamins-Minerals (CENTRUM SILVER 50+MEN) TABS Take by mouth.    pantoprazole (PROTONIX) 40 MG tablet TAKE 1 TABLET BY MOUTH  TWICE DAILY    rosuvastatin (CRESTOR) 20 MG tablet TAKE 1 TABLET BY MOUTH  DAILY    traMADol (ULTRAM) 50 MG tablet 2 TABLETS TWICE DAILY    No facility-administered encounter medications on file as of 09/03/2021.   Recent Office Vitals: BP Readings from Last 3 Encounters:  04/30/21 130/80  01/24/21 136/72  10/24/20  138/72   Pulse Readings from Last 3 Encounters:  04/30/21 88  01/24/21 100  10/24/20 86    Wt Readings from Last 3 Encounters:  04/30/21 180 lb (81.6 kg)  01/24/21 182 lb (82.6 kg)  10/24/20 177 lb 12.8 oz (80.6 kg)     Kidney Function Lab Results  Component Value Date/Time   CREATININE 1.30 (H) 04/30/2021 11:28 AM   CREATININE 1.39 (H) 01/24/2021 11:12 AM   GFRNONAA 67 10/05/2019 11:47 AM   GFRAA 78 10/05/2019 11:47 AM       Latest Ref Rng & Units 04/30/2021   11:28 AM 01/24/2021   11:12 AM 11/05/2020    4:12 PM  BMP  Glucose 70 - 99 mg/dL 258  527  98   BUN 8 - 27 mg/dL 13  11  8    Creatinine 0.76 - 1.27 mg/dL  7.82  4.23   BUN/Creat Ratio 10 - 24 10  8  7    Sodium 134 - 144 mmol/L 145  139  140   Potassium 3.5 - 5.2 mmol/L 5.0  4.4  4.1   Chloride 96 - 106 mmol/L 103  100  100   CO2 20 - 29 mmol/L 23  26  24    Calcium 8.6 - 10.2 mg/dL 5.36  9.3  9.1      Current antihypertensive regimen:  Amlodipine  Patient verbally confirms he is taking the above medications as directed. {yes/no:20286}  How often are you checking your Blood Pressure? {CHL HP BP Monitoring Frequency:[id number]}  he checks his blood pressure {timing:25218} {before/after:25217} taking his medication.  Current home BP readings: *** Wrist or arm cuff: Caffeine intake: Salt intake: OTC medications including pseudoephedrine or NSAIDs?  Any readings above 180/120? {yes/no:20286} If yes any symptoms of hypertensive emergency? {hypertensive emergency symptoms:25354}   What recent interventions/DTPs have been made by any provider to improve Blood Pressure control since last CPP Visit:  Educated on BP goals and benefits of medications for prevention of heart attack, stroke and kidney damage; Daily salt intake goal < 2300 mg; Exercise goal of 150 minutes per week; Importance of home blood pressure monitoring; -Counseled to monitor BP at home weekly, document, and provide log at future  appointments -Counseled on diet and exercise extensively Recommended to continue current medication  Any recent hospitalizations or ED visits since last visit with CPP? No  What diet changes have been made to improve Blood Pressure Control?  ***  What exercise is being done to improve your Blood Pressure Control?  ***  Adherence Review: Is the patient currently on ACE/ARB medication? {yes/no:20286} Does the patient have >5 day gap between last estimated fill dates? CPP to review  09-03-2021: 1st attempt left VM 09-06-2021: 2nd attempt left VM  Care Gaps: Last annual wellness visit? 10-24-2020 Tdap overdue  Star Rating Drugs: Rosuvastatin 20 mg- Last filled 07-16-2021 100 DS  Malecca [facility] CMA Clinical Pharmacist Assistant [phone]

## 2021-09-06 DIAGNOSIS — H60543 Acute eczematoid otitis externa, bilateral: Secondary | ICD-10-CM | POA: Diagnosis not present

## 2021-10-04 DIAGNOSIS — H60543 Acute eczematoid otitis externa, bilateral: Secondary | ICD-10-CM | POA: Diagnosis not present

## 2021-10-11 DIAGNOSIS — H60543 Acute eczematoid otitis externa, bilateral: Secondary | ICD-10-CM | POA: Diagnosis not present

## 2021-10-21 ENCOUNTER — Telehealth: Payer: Self-pay

## 2021-10-21 NOTE — Chronic Care Management (AMB) (Signed)
Chronic Care Management Pharmacy Assistant   Name: Gregory Delgado  MRN: [id number] DOB: [birth year]  Reason for Encounter: Disease State/ Hypertension  Recent office visits:  04-30-2021 Blane Ohara, MD. WBC= 12.2, MCH= 34.3, MCHC= 36.6. Glucose= 114, Creatinine= 1.30, Sodium= 145, Alkaline= 129. A1C= 5.7. Trig= 186, HDL= 34. CHANGE tramadol 50 mg 4 times daily as needed TO 50 mg 2 tablets twice daily. Kenelog injection given.  Recent consult visits:  08-07-2021 Roanna Epley, NP (Behavioral health). Visit for panic attack.  05-14-2021 Roanna Epley, NP (Behavioral health). Follow up visit no changes.  Hospital visits:  None in previous 6 months  Medications: Outpatient Encounter Medications as of 10/21/2021  Medication Sig Note   albuterol (VENTOLIN HFA) 108 (90 Base) MCG/ACT inhaler USE 2 INHALATIONS BY MOUTH  INTO THE LUNGS EVERY 6  HOURS AS NEEDED FOR  WHEEZING OR SHORTNESS OF  BREATH    ALPRAZolam (XANAX) 1 MG tablet Take 1 mg by mouth 2 (two) times daily.  10/24/2020: Prescribed by Barbette Merino   amLODipine (NORVASC) 10 MG tablet Take 10 mg by mouth daily. (Patient not taking: Reported on 04/30/2021)    Ascorbic Acid (VITAMIN C) 1000 MG tablet Take 1,000 mg by mouth daily.    aspirin 81 MG EC tablet Take 162 mg by mouth daily at 10 pm.    cholecalciferol (VITAMIN D3) 25 MCG (1000 UNIT) tablet Take 1,000 Units by mouth daily.    docusate sodium (COLACE) 100 MG capsule Take 100 mg by mouth daily.    Multiple Vitamins-Minerals (CENTRUM SILVER 50+MEN) TABS Take by mouth.    pantoprazole (PROTONIX) 40 MG tablet TAKE 1 TABLET BY MOUTH  TWICE DAILY    rosuvastatin (CRESTOR) 20 MG tablet TAKE 1 TABLET BY MOUTH  DAILY    traMADol (ULTRAM) 50 MG tablet 2 TABLETS TWICE DAILY    No facility-administered encounter medications on file as of 10/21/2021.   Recent Office Vitals: BP Readings from Last 3 Encounters:  04/30/21 130/80  01/24/21 136/72  10/24/20 138/72    Pulse Readings from Last 3 Encounters:  04/30/21 88  01/24/21 100  10/24/20 86    Wt Readings from Last 3 Encounters:  04/30/21 180 lb (81.6 kg)  01/24/21 182 lb (82.6 kg)  10/24/20 177 lb 12.8 oz (80.6 kg)     Kidney Function Lab Results  Component Value Date/Time   CREATININE 1.30 (H) 04/30/2021 11:28 AM   CREATININE 1.39 (H) 01/24/2021 11:12 AM   GFRNONAA 67 10/05/2019 11:47 AM   GFRAA 78 10/05/2019 11:47 AM       Latest Ref Rng & Units 04/30/2021   11:28 AM 01/24/2021   11:12 AM 11/05/2020    4:12 PM  BMP  Glucose 70 - 99 mg/dL 272  536  98   BUN 8 - 27 mg/dL 13  11  8    Creatinine 0.76 - 1.27 mg/dL  6.44  0.34   BUN/Creat Ratio 10 - 24 10  8  7    Sodium 134 - 144 mmol/L 145  139  140   Potassium 3.5 - 5.2 mmol/L 5.0  4.4  4.1   Chloride 96 - 106 mmol/L 103  100  100   CO2 20 - 29 mmol/L 23  26  24    Calcium 8.6 - 10.2 mg/dL 7.42  9.3  9.1      Adherence Review: Is the patient currently on ACE/ARB medication? No Does the patient have >5 day gap between last estimated fill  dates? CPP to review  10-21-2021: 1st attempt left VM 10-22-2021: 2nd attempt left VM 10-25-2021: 3rd attempt left VM  Care Gaps: Last annual wellness visit? 10-24-2020 Tdap overdue Colonoscopy overdue  Star Rating Drugs: Rosuvastatin 20 mg- Last filled 10-19-2021 100 DS  Huey Romans [facility] Clinical Pharmacist Assistant [phone]

## 2021-11-01 ENCOUNTER — Other Ambulatory Visit: Payer: Self-pay | Admitting: Family Medicine

## 2021-11-01 DIAGNOSIS — G8929 Other chronic pain: Secondary | ICD-10-CM

## 2021-11-05 ENCOUNTER — Ambulatory Visit (INDEPENDENT_AMBULATORY_CARE_PROVIDER_SITE_OTHER): Payer: Medicare Other | Admitting: Family Medicine

## 2021-11-05 ENCOUNTER — Encounter: Payer: Self-pay | Admitting: Family Medicine

## 2021-11-05 VITALS — BP 116/82 | HR 87 | Temp 97.5°F | Ht 68.0 in | Wt 188.0 lb

## 2021-11-05 DIAGNOSIS — E782 Mixed hyperlipidemia: Secondary | ICD-10-CM

## 2021-11-05 DIAGNOSIS — Z0000 Encounter for general adult medical examination without abnormal findings: Secondary | ICD-10-CM | POA: Diagnosis not present

## 2021-11-05 DIAGNOSIS — M545 Low back pain, unspecified: Secondary | ICD-10-CM | POA: Diagnosis not present

## 2021-11-05 DIAGNOSIS — F17219 Nicotine dependence, cigarettes, with unspecified nicotine-induced disorders: Secondary | ICD-10-CM

## 2021-11-05 DIAGNOSIS — R7301 Impaired fasting glucose: Secondary | ICD-10-CM

## 2021-11-05 DIAGNOSIS — I10 Essential (primary) hypertension: Secondary | ICD-10-CM

## 2021-11-05 DIAGNOSIS — Z125 Encounter for screening for malignant neoplasm of prostate: Secondary | ICD-10-CM | POA: Diagnosis not present

## 2021-11-05 DIAGNOSIS — M542 Cervicalgia: Secondary | ICD-10-CM

## 2021-11-05 NOTE — Progress Notes (Unsigned)
Subjective:   Gregory Delgado is a 71 y.o. male who presents for Medicare Annual/Subsequent preventive examination.  HPI: Hyperlipidemia:  Patient is currently taking Rosuvastatin 20 mg take 1 tablet daily.  Hypertension: Patient stopped his amlodipine 2 months ago when his bp was running a little low.   BP runs in 120-130s/70-80.   GERD: Patient is currently taking Pantoprazole 40 mg take 1 tablet twice daily.  Left shoulder, neck pain and  lumbar back pain. Taking tramadol 50 mg 2 oral twice daily.   Patient has never had a steroid injection. Pain has been worsening.   Review of Systems  Constitutional:  Negative for chills, diaphoresis, fever and malaise/fatigue.  HENT:  Negative for congestion, ear pain and sore throat.   Respiratory:  Negative for cough and sputum production.   Cardiovascular:  Negative for chest pain and palpitations.  Gastrointestinal:  Negative for abdominal pain, constipation, diarrhea, nausea and vomiting.  Genitourinary:  Negative for dysuria and urgency.  Musculoskeletal:  Positive for back pain, joint pain and neck pain. Negative for myalgias.  Neurological:  Negative for dizziness and headaches.  Psychiatric/Behavioral:  Negative for depression. The patient is not nervous/anxious.     Cardiac Risk Factors include: smoking/ tobacco exposure     Objective:    Today's Vitals   11/05/21 0953  BP: 116/82  Pulse: 87  Temp: (!) 97.5 F (36.4 C)  TempSrc: Temporal  SpO2: 97%  Weight: 188 lb (85.3 kg)  Height: 5\' 8"  (1.727 m)   Body mass index is 28.59 kg/m.  Physical Exam Vitals reviewed.  Constitutional:      Appearance: Normal appearance.  Neck:     Vascular: No carotid bruit.  Cardiovascular:     Rate and Rhythm: Normal rate and regular rhythm.     Heart sounds: Normal heart sounds.  Pulmonary:     Effort: Pulmonary effort is normal.     Breath sounds: Normal breath sounds. No wheezing, rhonchi or rales.  Abdominal:     General:  Bowel sounds are normal.     Palpations: Abdomen is soft.     Tenderness: There is no abdominal tenderness.  Musculoskeletal:        General: Tenderness (lumbar and neck) present.  Skin:    General: Skin is dry.  Neurological:     Mental Status: He is alert.  Psychiatric:        Mood and Affect: Mood normal.        Behavior: Behavior normal.         11/07/2021    9:46 PM 10/24/2020    4:07 PM  Advanced Directives  Does Patient Have a Medical Advance Directive? No No  Would patient like information on creating a medical advance directive? Yes (MAU/Ambulatory/Procedural Areas - Information given) No - Patient declined    Current Medications (verified) Outpatient Encounter Medications as of 11/05/2021  Medication Sig   albuterol (VENTOLIN HFA) 108 (90 Base) MCG/ACT inhaler USE 2 INHALATIONS BY MOUTH  INTO THE LUNGS EVERY 6  HOURS AS NEEDED FOR  WHEEZING OR SHORTNESS OF  BREATH   ALPRAZolam (XANAX) 1 MG tablet Take 1 mg by mouth 2 (two) times daily.    Ascorbic Acid (VITAMIN C) 1000 MG tablet Take 1,000 mg by mouth daily.   aspirin 81 MG EC tablet Take 162 mg by mouth daily at 10 pm.   cholecalciferol (VITAMIN D3) 25 MCG (1000 UNIT) tablet Take 1,000 Units by mouth daily.   ciprofloxacin (CILOXAN) 0.3 %  ophthalmic solution    docusate sodium (COLACE) 100 MG capsule Take 100 mg by mouth daily.   Multiple Vitamins-Minerals (CENTRUM SILVER 50+MEN) TABS Take by mouth.   pantoprazole (PROTONIX) 40 MG tablet TAKE 1 TABLET BY MOUTH  TWICE DAILY   rosuvastatin (CRESTOR) 20 MG tablet TAKE 1 TABLET BY MOUTH  DAILY   traMADol (ULTRAM) 50 MG tablet TAKE 2 TABLETS BY MOUTH TWICE A DAY   [DISCONTINUED] amLODipine (NORVASC) 10 MG tablet Take 10 mg by mouth daily.   No facility-administered encounter medications on file as of 11/05/2021.    Allergies (verified) Augmentin [amoxicillin-pot clavulanate]   History: Past Medical History:  Diagnosis Date   COPD (chronic obstructive pulmonary  disease) (HCC)    GERD (gastroesophageal reflux disease)    Hyperlipidemia    Hypertension    Osteoarthritis    Past Surgical History:  Procedure Laterality Date   CERVICAL DISC SURGERY  2015   C2-C4    KNEE SURGERY  1976   Family History  Problem Relation Age of Onset   Diabetes Mother    Stroke Mother    Alcoholism Father    CVA Sister    Stroke Sister    Congenital heart disease Daughter    Rectal cancer Son    Cirrhosis Son    Cancer - Colon Son    Social History   Socioeconomic History   Marital status: Single    Spouse name: Not on file   Number of children: Not on file   Years of education: Not on file   Highest education level: Not on file  Occupational History   Occupation: Retired Naval architect  Tobacco Use   Smoking status: Every Day    Packs/day: 0.75    Years: 55.00    Total pack years: 41.25    Types: Cigarettes   Smokeless tobacco: Never  Vaping Use   Vaping Use: Never used  Substance and Sexual Activity   Alcohol use: Never   Drug use: Never   Sexual activity: Not on file  Other Topics Concern   Not on file  Social History Narrative   Not on file   Social Determinants of Health   Financial Resource Strain: Low Risk  (11/05/2021)   Overall Financial Resource Strain (CARDIA)    Difficulty of Paying Living Expenses: Not hard at all  Food Insecurity: No Food Insecurity (11/05/2021)   Hunger Vital Sign    Worried About Running Out of Food in the Last Year: Never true    Ran Out of Food in the Last Year: Never true  Transportation Needs: No Transportation Needs (11/05/2021)   PRAPARE - Administrator, Civil Service (Medical): No    Lack of Transportation (Non-Medical): No  Physical Activity: Inactive (11/05/2021)   Exercise Vital Sign    Days of Exercise per Week: 0 days    Minutes of Exercise per Session: 0 min  Stress: No Stress Concern Present (11/05/2021)   [employer] of Occupational Health - Occupational Stress  Questionnaire    Feeling of Stress : Not at all  Social Connections: Socially Isolated (11/05/2021)   Social Connection and Isolation Panel [NHANES]    Frequency of Communication with Friends and Family: More than three times a week    Frequency of Social Gatherings with Friends and Family: More than three times a week    Attends Religious Services: Never    Database administrator or Organizations: No    Attends Banker  Meetings: Never    Marital Status: Divorced    Tobacco Counseling Ready to quit: Not Answered Counseling given: Not Answered  Activities of Daily Living    11/05/2021   10:03 AM  In your present state of health, do you have any difficulty performing the following activities:  Hearing? 0  Vision? 0  Difficulty concentrating or making decisions? 0  Walking or climbing stairs? 0  Dressing or bathing? 0  Doing errands, shopping? 0  Preparing Food and eating ? N  Using the Toilet? N  In the past six months, have you accidently leaked urine? N  Do you have problems with loss of bowel control? N  Managing your Medications? N  Managing your Finances? N  Housekeeping or managing your Housekeeping? N    Patient Care Team: Blane Oharaox, Renesha Lizama, MD as PCP - General (Family Medicine) Maudie FlakesMa, Brandon, MD as Referring Physician (Otolaryngology) Zettie PhoKennedy, Nathan K, [facility] (Pharmacist)  Indicate any recent Medical Services you may have received from other than [facility] providers in the past year (date may be approximate).     Assessment:   This is a routine wellness examination for Jakob.  Hearing/Vision screen No results found.  Dietary issues and exercise activities discussed: Current Exercise Habits: The patient does not participate in regular exercise at present, Exercise limited by: None identified   Goals Addressed   None   Depression Screen    11/05/2021    9:49 AM 04/30/2021   10:51 AM 01/24/2021   10:19 AM 10/17/2020   10:53 AM 10/17/2020   10:14 AM 04/12/2020    12:19 PM 04/12/2020   10:27 AM  PHQ 2/9 Scores  PHQ - 2 Score 0 0 0 1 0 1 0  PHQ- 9 Score 0   3  2 0    Fall Risk    11/07/2021    9:47 PM 11/05/2021   10:03 AM 04/30/2021   10:51 AM 01/24/2021   10:19 AM 10/24/2020    4:07 PM  Fall Risk   Falls in the past year?  0 0 0 0  Number falls in past yr:  0 0 0 0  Injury with Fall?  0 0 0 0  Risk for fall due to : No Fall Risks    No Fall Risks  Follow up Falls evaluation completed;Education provided Falls evaluation completed Falls evaluation completed Falls evaluation completed Falls evaluation completed    FALL RISK PREVENTION PERTAINING TO THE HOME:  Any stairs in or around the home? Yes  If so, are there any without handrails? Yes  Home free of loose throw rugs in walkways, pet beds, electrical cords, etc? Yes  Adequate lighting in your home to reduce risk of falls? Yes   ASSISTIVE DEVICES UTILIZED TO PREVENT FALLS:  Life alert? No  Use of a cane, walker or w/c? No  Grab bars in the bathroom? No  Shower chair or bench in shower? No  Elevated toilet seat or a handicapped toilet? No   TIMED UP AND GO:  Was the test performed? Yes .  Length of time to ambulate 10 feet: 3 sec.   Gait steady and fast without use of assistive device  Cognitive Function:        10/24/2020    4:09 PM  6CIT Screen  What Year? 0 points  What month? 0 points  What time? 0 points  Count back from 20 0 points  Months in reverse 0 points  Repeat phrase 0 points  Total Score  0 points    Immunizations  There is no immunization history on file for this patient. Refuses all vaccines.   Screening Tests Health Maintenance  Topic Date Due   TETANUS/TDAP  Never done   Pneumonia Vaccine 34+ Years old (1 - PCV) 01/24/2022 (Originally 01/25/1957)   Hepatitis C Screening  Completed   HPV VACCINES  Aged Out   INFLUENZA VACCINE  Discontinued   COLONOSCOPY (Pts 45-70yrs Insurance coverage will need to be confirmed)  Discontinued   COVID-19  Vaccine  Discontinued   Zoster Vaccines- Shingrix  Discontinued    Health Maintenance  Health Maintenance Due  Topic Date Due   TETANUS/TDAP  Never done   Refuses cancer screening.  Additional Screening:  Vision Screening: Recommended annual ophthalmology exams for early detection of glaucoma and other disorders of the eye. Dental Screening: Recommended annual dental exams for proper oral hygiene  Community Resource Referral / Chronic Care Management: CRR required this visit?  No   CCM required this visit?  Yes      Plan:    Problem List Items Addressed This Visit       Cardiovascular and Mediastinum   Essential hypertension, benign    Controlled off medicaton. Continue to work on eating a healthy diet and exercise.  Labs drawn today.        Relevant Orders   CBC with Differential/Platelet (Completed)   Comprehensive metabolic panel (Completed)     Endocrine   Impaired fasting glucose    Recommend continue to work on eating healthy diet and exercise.       Relevant Orders   Hemoglobin A1c (Completed)     Nervous and Auditory   Cigarette nicotine dependence with nicotine-induced disorder    Recommend cessation.         Other   Mixed hyperlipidemia    Well controlled.  No changes to medicines. Continue Rosuvastatin 20 mg take 1 tablet daily. Continue to work on eating a healthy diet and exercise.  Labs drawn today.        Relevant Orders   Lipid panel (Completed)   TSH (Completed)   Cardiovascular Risk Assessment (Completed)   Lumbar back pain    Recommend physical therapy for neck and back. Please let me know if you would like to pursue therapy.       Relevant Orders   DG Lumbar Spine Complete   PSA (Completed)   Encounter for Medicare annual wellness exam - Primary    Things to do to keep yourself healthy  - Exercise at least 30-45 minutes a day, 3-4 days a week.  - Eat a low-fat diet with lots of fruits and vegetables, up to 7-9 servings  per day.  - Seatbelts can save your life. Wear them always.  - Smoke detectors on every level of your home, check batteries every year.  - Eye Doctor - have an eye exam every 1-2 years  - Safe sex - if you may be exposed to STDs, use a condom.  - Alcohol -  If you drink, do it moderately, less than 2 drinks per day.  - Health Care Power of Attorney. Choose someone to speak for you if you are not able.  - Depression is common in our stressful world.If you're feeling down or losing interest in things you normally enjoy, please come in for a visit.  - Violence - If anyone is threatening or hurting you, please call immediately.   Recommend tetanus (tdap) at pharmacy.  Neck pain      Recommend physical therapy for neck and back. Please let me know if you would like to pursue therapy.       Relevant Orders   DG Cervical Spine Complete   Prostate cancer screening   Relevant Orders   PSA (Completed)     I have personally reviewed and noted the following in the patient's chart:   Medical and social history Use of alcohol, tobacco or illicit drugs  Current medications and supplements including opioid prescriptions. Patient is not currently taking opioid prescriptions. Functional ability and status Nutritional status Physical activity Advanced directives List of other physicians Hospitalizations, surgeries, and ER visits in previous 12 months Vitals Screenings to include cognitive, depression, and falls Referrals and appointments  In addition, I have reviewed and discussed with patient certain preventive protocols, quality metrics, and best practice recommendations. A written personalized care plan for preventive services as well as general preventive health recommendations were provided to patient.      I,Lauren M Auman,acting as a scribe for [name], MD.,have documented all relevant documentation on the behalf of [name], MD,as directed by  [name], MD while  in the presence of [name], MD.    [name], MD   11/07/2021

## 2021-11-05 NOTE — Patient Instructions (Addendum)
Things to do to keep yourself healthy  - Exercise at least 30-45 minutes a day, 3-4 days a week.  - Eat a low-fat diet with lots of fruits and vegetables, up to 7-9 servings per day.  - Seatbelts can save your life. Wear them always.  - Smoke detectors on every level of your home, check batteries every year.  - Eye Doctor - have an eye exam every 1-2 years  - Safe sex - if you may be exposed to STDs, use a condom.  - Alcohol -  If you drink, do it moderately, less than 2 drinks per day.  - [place]. Choose someone to speak for you if you are not able.  - Depression is common in our stressful world.If you're feeling down or losing interest in things you normally enjoy, please come in for a visit.  - Violence - If anyone is threatening or hurting you, please call immediately.   Recommend tetanus (tdap) at pharmacy.  Recommend quit smoking.  Recommend physical therapy for neck and back. Please let me know if you would like to pursue therapy.

## 2021-11-06 LAB — COMPREHENSIVE METABOLIC PANEL
ALT: 14 IU/L (ref 0–44)
AST: 17 IU/L (ref 0–40)
Albumin/Globulin Ratio: 2 (ref 1.2–2.2)
Albumin: 4.5 g/dL (ref 3.9–4.9)
Alkaline Phosphatase: 113 IU/L (ref 44–121)
BUN/Creatinine Ratio: 9 — ABNORMAL LOW (ref 10–24)
BUN: 10 mg/dL (ref 8–27)
Bilirubin Total: 0.4 mg/dL (ref 0.0–1.2)
CO2: 24 mmol/L (ref 20–29)
Calcium: 9.5 mg/dL (ref 8.6–10.2)
Chloride: 101 mmol/L (ref 96–106)
Creatinine, Ser: 1.12 mg/dL (ref 0.76–1.27)
Globulin, Total: 2.2 g/dL (ref 1.5–4.5)
Glucose: 108 mg/dL — ABNORMAL HIGH (ref 70–99)
Potassium: 4.7 mmol/L (ref 3.5–5.2)
Sodium: 140 mmol/L (ref 134–144)
Total Protein: 6.7 g/dL (ref 6.0–8.5)
eGFR: 72 mL/min/{1.73_m2} (ref >59)

## 2021-11-06 LAB — CBC WITH DIFFERENTIAL/PLATELET
Basophils Absolute: 0.1 10*3/uL (ref 0.0–0.2)
Basos: 1 %
EOS (ABSOLUTE): 0.2 10*3/uL (ref 0.0–0.4)
Eos: 2 %
Hematocrit: 38 % (ref 37.5–51.0)
Hemoglobin: 14.1 g/dL (ref 13.0–17.7)
Immature Grans (Abs): 0 10*3/uL (ref 0.0–0.1)
Immature Granulocytes: 0 %
Lymphocytes Absolute: 2.3 10*3/uL (ref 0.7–3.1)
Lymphs: 22 %
MCH: 35.1 pg — ABNORMAL HIGH (ref 26.6–33.0)
MCHC: 37.1 g/dL — ABNORMAL HIGH (ref 31.5–35.7)
MCV: 95 fL (ref 79–97)
Monocytes Absolute: 0.8 10*3/uL (ref 0.1–0.9)
Monocytes: 8 %
Neutrophils Absolute: 6.9 10*3/uL (ref 1.4–7.0)
Neutrophils: 67 %
Platelets: 255 10*3/uL (ref 150–450)
RBC: 4.02 x10E6/uL — ABNORMAL LOW (ref 4.14–5.80)
RDW: 13.5 % (ref 11.6–15.4)
WBC: 10.3 10*3/uL (ref 3.4–10.8)

## 2021-11-06 LAB — TSH: TSH: 1.57 u[IU]/mL (ref 0.450–4.500)

## 2021-11-06 LAB — HEMOGLOBIN A1C
Est. average glucose Bld gHb Est-mCnc: 126 mg/dL
Hgb A1c MFr Bld: 6 % — ABNORMAL HIGH (ref 4.8–5.6)

## 2021-11-06 LAB — LIPID PANEL
Chol/HDL Ratio: 3.2 ratio (ref 0.0–5.0)
Cholesterol, Total: 120 mg/dL (ref 100–199)
HDL: 37 mg/dL — ABNORMAL LOW (ref >39)
LDL Chol Calc (NIH): 55 mg/dL (ref 0–99)
Triglycerides: 164 mg/dL — ABNORMAL HIGH (ref 0–149)
VLDL Cholesterol Cal: 28 mg/dL (ref 5–40)

## 2021-11-06 LAB — PSA: Prostate Specific Ag, Serum: 1.2 ng/mL (ref 0.0–4.0)

## 2021-11-06 LAB — CARDIOVASCULAR RISK ASSESSMENT

## 2021-11-06 NOTE — Progress Notes (Signed)
Blood count normal.  Liver function normal.  Kidney function returned to normal.  Thyroid function normal.  Cholesterol: trigs little high, but improved. Rest is good.  HBA1C: 6. Increased from 5.7. watch diet.   PSA normal

## 2021-11-07 ENCOUNTER — Encounter: Payer: Self-pay | Admitting: Family Medicine

## 2021-11-07 DIAGNOSIS — M542 Cervicalgia: Secondary | ICD-10-CM | POA: Insufficient documentation

## 2021-11-07 DIAGNOSIS — Z0000 Encounter for general adult medical examination without abnormal findings: Secondary | ICD-10-CM | POA: Insufficient documentation

## 2021-11-07 DIAGNOSIS — Z125 Encounter for screening for malignant neoplasm of prostate: Secondary | ICD-10-CM | POA: Insufficient documentation

## 2021-11-07 NOTE — Assessment & Plan Note (Signed)
Recommend cessation. ?

## 2021-11-07 NOTE — Assessment & Plan Note (Signed)
Things to do to keep yourself healthy  - Exercise at least 30-45 minutes a day, 3-4 days a week.  - Eat a low-fat diet with lots of fruits and vegetables, up to 7-9 servings per day.  - Seatbelts can save your life. Wear them always.  - Smoke detectors on every level of your home, check batteries every year.  - Eye Doctor - have an eye exam every 1-2 years  - Safe sex - if you may be exposed to STDs, use a condom.  - Alcohol -  If you drink, do it moderately, less than 2 drinks per day.  - [place]. Choose someone to speak for you if you are not able.  - Depression is common in our stressful world.If you're feeling down or losing interest in things you normally enjoy, please come in for a visit.  - Violence - If anyone is threatening or hurting you, please call immediately.   Recommend tetanus (tdap) at pharmacy.

## 2021-11-07 NOTE — Assessment & Plan Note (Signed)
  Recommend physical therapy for neck and back. Please let me know if you would like to pursue therapy.

## 2021-11-07 NOTE — Assessment & Plan Note (Signed)
Controlled off medicaton. Continue to work on eating a healthy diet and exercise.  Labs drawn today.

## 2021-11-07 NOTE — Assessment & Plan Note (Signed)
Recommend physical therapy for neck and back. Please let me know if you would like to pursue therapy.

## 2021-11-07 NOTE — Assessment & Plan Note (Signed)
Recommend continue to work on eating healthy diet and exercise.  

## 2021-11-07 NOTE — Assessment & Plan Note (Addendum)
Well controlled.  No changes to medicines. Continue Rosuvastatin 20 mg take 1 tablet daily. Continue to work on eating a healthy diet and exercise.  Labs drawn today.    

## 2021-11-21 DIAGNOSIS — M545 Low back pain, unspecified: Secondary | ICD-10-CM | POA: Diagnosis not present

## 2021-11-21 DIAGNOSIS — M542 Cervicalgia: Secondary | ICD-10-CM | POA: Diagnosis not present

## 2021-11-21 DIAGNOSIS — M4316 Spondylolisthesis, lumbar region: Secondary | ICD-10-CM | POA: Diagnosis not present

## 2021-11-25 ENCOUNTER — Telehealth: Payer: Self-pay

## 2021-11-25 NOTE — Chronic Care Management (AMB) (Signed)
Chronic Care Management Pharmacy Assistant   Name: Gregory Delgado  MRN: [id number] DOB: May 28, 1950  Reason for Encounter: Disease State/ Hypertension  Recent office visits:  11-08-2021 Blane Ohara, MD. RBC= 4.02, MCH= 35.1, MCHC= 37.1. Glucose= 108, BUN/Creatinine= 9. A1C= 6.0. Trig= 164, HDL= 37. DG Cervical Spine Complete and DG Lumbar Spine Complete ordered. STOP amlodipine.   Recent consult visits:  08-07-2021 Roanna Epley, NP (Behavioral health). Visit for panic attack.  Hospital visits:  None in previous 6 months  Medications: Outpatient Encounter Medications as of 11/25/2021  Medication Sig Note   albuterol (VENTOLIN HFA) 108 (90 Base) MCG/ACT inhaler USE 2 INHALATIONS BY MOUTH  INTO THE LUNGS EVERY 6  HOURS AS NEEDED FOR  WHEEZING OR SHORTNESS OF  BREATH    ALPRAZolam (XANAX) 1 MG tablet Take 1 mg by mouth 2 (two) times daily.  10/24/2020: Prescribed by Barbette Merino   Ascorbic Acid (VITAMIN C) 1000 MG tablet Take 1,000 mg by mouth daily.    aspirin 81 MG EC tablet Take 162 mg by mouth daily at 10 pm.    cholecalciferol (VITAMIN D3) 25 MCG (1000 UNIT) tablet Take 1,000 Units by mouth daily.    ciprofloxacin (CILOXAN) 0.3 % ophthalmic solution     docusate sodium (COLACE) 100 MG capsule Take 100 mg by mouth daily.    Multiple Vitamins-Minerals (CENTRUM SILVER 50+MEN) TABS Take by mouth.    pantoprazole (PROTONIX) 40 MG tablet TAKE 1 TABLET BY MOUTH  TWICE DAILY    rosuvastatin (CRESTOR) 20 MG tablet TAKE 1 TABLET BY MOUTH  DAILY    traMADol (ULTRAM) 50 MG tablet TAKE 2 TABLETS BY MOUTH TWICE A DAY    No facility-administered encounter medications on file as of 11/25/2021.   Recent Office Vitals: BP Readings from Last 3 Encounters:  11/05/21 116/82  04/30/21 130/80  01/24/21 136/72   Pulse Readings from Last 3 Encounters:  11/05/21 87  04/30/21 88  01/24/21 100    Wt Readings from Last 3 Encounters:  11/05/21 188 lb (85.3 kg)  04/30/21 180 lb  (81.6 kg)  01/24/21 182 lb (82.6 kg)     Kidney Function Lab Results  Component Value Date/Time   CREATININE 1.12 11/05/2021 10:34 AM   CREATININE 1.30 (H) 04/30/2021 11:28 AM   GFRNONAA 67 10/05/2019 11:47 AM   GFRAA 78 10/05/2019 11:47 AM       Latest Ref Rng & Units 11/05/2021   10:34 AM 04/30/2021   11:28 AM 01/24/2021   11:12 AM  BMP  Glucose 70 - 99 mg/dL 884  166  063   BUN 8 - 27 mg/dL 10  13  11    Creatinine 0.76 - 1.27 mg/dL  0.16  0.10   BUN/Creat Ratio 10 - 24 9  10  8    Sodium 134 - 144 mmol/L 140  145  139   Potassium 3.5 - 5.2 mmol/L 4.7  5.0  4.4   Chloride 96 - 106 mmol/L 101  103  100   CO2 20 - 29 mmol/L 24  23  26    Calcium 8.6 - 10.2 mg/dL 9.5  9.32  9.3     : 1st attempt left VM 11-26-2021: 2nd attempt Left VM 11-27-2021: 3rd attempt left VM  Adherence Review: Is the patient currently on ACE/ARB medication? No Does the patient have >5 day gap between last estimated fill dates? CPP to review  Care Gaps: Last annual wellness visit? 10-24-2020 Tdap overdue  Star Rating Drugs:  Rosuvastatin 20 mg- Last filled 10-19-2021 100 DS  [place] Clinical Pharmacist Assistant [phone]

## 2021-11-28 ENCOUNTER — Other Ambulatory Visit: Payer: Self-pay

## 2021-11-28 ENCOUNTER — Other Ambulatory Visit: Payer: Self-pay | Admitting: Family Medicine

## 2021-11-28 DIAGNOSIS — M542 Cervicalgia: Secondary | ICD-10-CM

## 2021-11-28 DIAGNOSIS — M545 Low back pain, unspecified: Secondary | ICD-10-CM

## 2021-12-03 ENCOUNTER — Other Ambulatory Visit: Payer: Self-pay

## 2021-12-03 DIAGNOSIS — M542 Cervicalgia: Secondary | ICD-10-CM

## 2021-12-03 DIAGNOSIS — M545 Low back pain, unspecified: Secondary | ICD-10-CM

## 2021-12-10 DIAGNOSIS — H6063 Unspecified chronic otitis externa, bilateral: Secondary | ICD-10-CM | POA: Diagnosis not present

## 2021-12-10 DIAGNOSIS — F17200 Nicotine dependence, unspecified, uncomplicated: Secondary | ICD-10-CM | POA: Diagnosis not present

## 2021-12-10 DIAGNOSIS — H60543 Acute eczematoid otitis externa, bilateral: Secondary | ICD-10-CM | POA: Diagnosis not present

## 2021-12-10 DIAGNOSIS — H6123 Impacted cerumen, bilateral: Secondary | ICD-10-CM | POA: Diagnosis not present

## 2021-12-10 DIAGNOSIS — H61303 Acquired stenosis of external ear canal, unspecified, bilateral: Secondary | ICD-10-CM | POA: Diagnosis not present

## 2021-12-15 ENCOUNTER — Other Ambulatory Visit: Payer: Medicare Other

## 2021-12-17 ENCOUNTER — Ambulatory Visit (INDEPENDENT_AMBULATORY_CARE_PROVIDER_SITE_OTHER): Payer: Medicare Other

## 2021-12-17 ENCOUNTER — Telehealth: Payer: Medicare Other

## 2021-12-17 DIAGNOSIS — I10 Essential (primary) hypertension: Secondary | ICD-10-CM

## 2021-12-17 DIAGNOSIS — E782 Mixed hyperlipidemia: Secondary | ICD-10-CM

## 2021-12-17 DIAGNOSIS — F17219 Nicotine dependence, cigarettes, with unspecified nicotine-induced disorders: Secondary | ICD-10-CM

## 2021-12-17 NOTE — Progress Notes (Signed)
Chronic Care Management Pharmacy Note  12/17/2021 Name:  Gregory Delgado MRN:  [id number] DOB:  1950-07-30  Plan Updates:  BP high per patient. Will have CCM team reach out in a week to get readings  Subjective: Gregory Delgado is an 71 y.o. year old male who is a primary patient of Cox, Kirsten, MD.  The CCM team was consulted for assistance with disease management and care coordination needs.    Engaged with patient by telephone for follow up visit in response to provider referral for pharmacy case management and/or care coordination services.   Consent to Services:  The patient was given the following information about Chronic Care Management services today, agreed to services, and gave verbal consent: 1. CCM service includes personalized support from designated clinical staff supervised by the primary care provider, including individualized plan of care and coordination with other care providers 2. 24/7 contact phone numbers for assistance for urgent and routine care needs. 3. Service will only be billed when office clinical staff spend 20 minutes or more in a month to coordinate care. 4. Only one practitioner may furnish and bill the service in a calendar month. 5.The patient may stop CCM services at any time (effective at the end of the month) by phone call to the office staff. 6. The patient will be responsible for cost sharing (co-pay) of up to 20% of the service fee (after annual deductible is met). Patient agreed to services and consent obtained.  Patient Care Team: [name], MD as PCP - General (Family Medicine) Cletis Athens, MD as Referring Physician (Otolaryngology) Lane Hacker, [facility] (Pharmacist)  Recent office visits:  11-08-2021 [name], MD. RBC= 4.02, MCH= 35.1, MCHC= 37.1. Glucose= 108, BUN/Creatinine= 9. A1C= 6.0. Trig= 164, HDL= 37. DG Cervical Spine Complete and DG Lumbar Spine Complete ordered. STOP amlodipine.    Recent consult visits:  08-07-2021  Christiana Fuchs, NP (Behavioral health). Visit for panic attack.   Hospital visits:  None in previous 6 months  Objective:  Lab Results  Component Value Date   CREATININE 1.12 11/05/2021   BUN 10 11/05/2021   GFRNONAA 67 10/05/2019   GFRAA 78 10/05/2019   NA 140 11/05/2021   K 4.7 11/05/2021   CALCIUM 9.5 11/05/2021   CO2 24 11/05/2021    Lab Results  Component Value Date/Time   HGBA1C 6.0 (H) 11/05/2021 10:34 AM   HGBA1C 5.7 (H) 04/30/2021 11:28 AM    Last diabetic Eye exam: No results found for: "HMDIABEYEEXA"  Last diabetic Foot exam: No results found for: "HMDIABFOOTEX"   Lab Results  Component Value Date   CHOL 120 11/05/2021   HDL 37 (L) 11/05/2021   LDLCALC 55 11/05/2021   TRIG 164 (H) 11/05/2021   CHOLHDL 3.2 11/05/2021       Latest Ref Rng & Units 11/05/2021   10:34 AM 04/30/2021   11:28 AM 01/24/2021   11:12 AM  Hepatic Function  Total Protein 6.0 - 8.5 g/dL 6.7  7.3  7.4   Albumin 3.9 - 4.9 g/dL 4.5  4.7  4.7   AST 0 - 40 IU/L _0 ALT 0 - 44 IU/L _1 Alk Phosphatase 44 - 121 IU/L 113  129  117   Total Bilirubin 0.0 - 1.2 mg/dL 0.4  0.5  0.4     Lab Results  Component Value Date/Time   TSH 1.570 11/05/2021 10:34 AM   TSH 3.730 04/05/2019 10:34  AM       Latest Ref Rng & Units 11/05/2021   10:34 AM 04/30/2021   11:28 AM 01/24/2021   11:12 AM  CBC  WBC 3.4 - 10.8 x10E3/uL 10.3  12.2  11.9   Hemoglobin 13.0 - 17.7 g/dL 14.1  15.2  15.5   Hematocrit 37.5 - 51.0 % 38.0  41.5  46.4   Platelets 150 - 450 x10E3/uL 255  274  286     No results found for: "VD25OH"  Clinical ASCVD: No  The ASCVD Risk score (Arnett DK, et al., 2019) failed to calculate for the following reasons:   The valid total cholesterol range is 130 to 320 mg/dL       11/05/2021    9:49 AM 04/30/2021   10:51 AM 01/24/2021   10:19 AM  Depression screen PHQ 2/9  Decreased Interest 0 0 0  Down, Depressed, Hopeless 0 0 0  PHQ - 2 Score 0 0 0  Altered  sleeping 0    Tired, decreased energy 0    Change in appetite 0    Feeling bad or failure about yourself  0    Trouble concentrating 0    Moving slowly or fidgety/restless 0    Suicidal thoughts 0    PHQ-9 Score 0    Difficult doing work/chores Not difficult at all      Social History   Tobacco Use  Smoking Status Every Day   Packs/day: 0.50   Years: 55.00   Total pack years: 27.50   Types: Cigarettes  Smokeless Tobacco Never   BP Readings from Last 3 Encounters:  11/05/21 116/82  04/30/21 130/80  01/24/21 136/72   Pulse Readings from Last 3 Encounters:  11/05/21 87  04/30/21 88  01/24/21 100   Wt Readings from Last 3 Encounters:  11/05/21 188 lb (85.3 kg)  04/30/21 180 lb (81.6 kg)  01/24/21 182 lb (82.6 kg)    Assessment/Interventions: Review of patient past medical history, allergies, medications, health status, including review of consultants reports, laboratory and other test data, was performed as part of comprehensive evaluation and provision of chronic care management services.   SDOH:  (Social Determinants of Health) assessments and interventions performed: Yes SDOH Interventions    Flowsheet Row Chronic Care Management from 12/17/2021 in [place] Office Visit from 11/05/2021 in [place] Management from 03/19/2021 in [place] Office Visit from 01/24/2021 in [place] Office Visit from 04/12/2020 in [place] Office Visit from 10/05/2019 in [place]  SDOH Interventions        Food Insecurity Interventions -- Intervention Not Indicated -- -- -- --  Housing Interventions -- Intervention Not Indicated -- Intervention Not Indicated -- --  Transportation Interventions Intervention Not Indicated Intervention Not Indicated Intervention Not Indicated Intervention Not Indicated -- --  Utilities Interventions -- Intervention Not Indicated -- -- -- --  Alcohol Usage Interventions -- Intervention Not  Indicated (Score <7) -- -- -- --  Depression Interventions/Treatment  -- -- -- -- PHQ2-9 Score <4 Follow-up Not Indicated Currently on Treatment  [Managed by psychiatry.]  Financial Strain Interventions -- Intervention Not Indicated Intervention Not Indicated -- -- --  Physical Activity Interventions -- Intervention Not Indicated -- -- -- --  Stress Interventions -- Intervention Not Indicated -- -- -- --  Social Connections Interventions -- Intervention Not Indicated -- Intervention Not Indicated -- --       CCM Care Plan  Allergies  Allergen  Reactions   Augmentin [Amoxicillin-Pot Clavulanate] Itching and Nausea And Vomiting    Medications Reviewed Today     Reviewed by Lane Hacker, [facility] (Pharmacist) on 12/17/21 at [phone]  Med List Status: <None>   Medication Order Taking? Sig Documenting Provider Last Dose Status Informant  albuterol (VENTOLIN HFA) 108 (90 Base) MCG/ACT inhaler [id number] No USE 2 INHALATIONS BY MOUTH  INTO THE LUNGS EVERY 6  HOURS AS NEEDED FOR  WHEEZING OR SHORTNESS OF  Dia Crawford, Kirsten, MD Taking Active   ALPRAZolam Duanne Moron) 1 MG tablet [id number] No Take 1 mg by mouth 2 (two) times daily.  [provider] Taking Active            Med Note Rhae Hammock M   Wed Oct 24, 2020  4:00 PM) Prescribed by Chaya Jan  Ascorbic Acid (VITAMIN C) 1000 MG tablet [id number] No Take 1,000 mg by mouth daily. [provider] Taking Active   aspirin 81 MG EC tablet [id number] No Take 162 mg by mouth daily at 10 pm. [provider] Taking Active   cholecalciferol (VITAMIN D3) 25 MCG (1000 UNIT) tablet [id number] No Take 1,000 Units by mouth daily. [provider] Taking Active   ciprofloxacin (CILOXAN) 0.3 % ophthalmic solution [id number] No  [provider] Taking Active   docusate sodium (COLACE) 100 MG capsule [id number] No Take 100 mg by mouth daily. [provider] Taking Active   Multiple Vitamins-Minerals (CENTRUM  SILVER 50+MEN) TABS [id number] No Take by mouth. [provider] Taking Active   pantoprazole (PROTONIX) 40 MG tablet [id number] No TAKE 1 TABLET BY MOUTH  TWICE DAILY Cox, Kirsten, MD Taking Active   rosuvastatin (CRESTOR) 20 MG tablet [id number] No TAKE 1 TABLET BY MOUTH  DAILY Cox, Kirsten, MD Taking Active   traMADol (ULTRAM) 50 MG tablet [id number] No TAKE 2 TABLETS BY MOUTH TWICE A Luster Landsberg, MD Taking Active             Patient Active Problem List   Diagnosis Date Noted   Encounter for Medicare annual wellness exam 11/07/2021   Neck pain 11/07/2021   Prostate cancer screening 11/07/2021   Chronic left shoulder pain 04/30/2021   Lumbar back pain 02/10/2021   Mild recurrent major depression ([place]) 10/24/2020   Impaired fasting glucose 04/10/2019   Screening for prostate cancer 04/10/2019   Essential hypertension, benign 04/04/2019   Mixed hyperlipidemia 04/04/2019   Cigarette nicotine dependence with nicotine-induced disorder 04/04/2019   GERD without esophagitis 04/04/2019   Cervical radicular pain 04/04/2019   Panic attack 11/18/2016   Long term current use of opiate analgesic 02/20/2012   Pain syndrome, chronic 02/20/2012   COPD with emphysema ([place]) 01/27/2012   Other cervical disc degeneration, unspecified cervical region 01/27/2012     There is no immunization history on file for this patient.  Conditions to be addressed/monitored:  Hypertension, Hyperlipidemia, GERD, COPD, Anxiety, impaired fasting glucose and pain  Care Plan : CCM Pharmacy Care Plan  Updates made by Lane Hacker, RPH since 12/17/2021 12:00 AM     Problem: hyperlipidemia, hypertension, anxiety, COPD, tobacco use   Priority: High  Onset Date: 03/27/2020     Long-Range Goal: Disease Management   Start Date: 03/27/2020  Expected End Date: 03/27/2021  Recent Progress: On track  Priority: High  Note:    Current Barriers:  Unable to independently monitor therapeutic efficacy    Pharmacist Clinical Goal(s):  Over the next 90 days, patient will achieve adherence to  monitoring guidelines and medication adherence to achieve therapeutic efficacy through collaboration with PharmD and provider.      Interventions: 1:1 collaboration with [name], MD regarding development and update of comprehensive plan of care as evidenced by provider attestation and co-signature Inter-disciplinary care team collaboration (see longitudinal plan of care) Comprehensive medication review performed; medication list updated in electronic medical record   Hypertension (BP goal <140/90) -Not controlled -Current treatment: None -Medications previously tried: amlodipine, lisinopril  -Current home readings:   Feb 2023: Doesn't write but normal 125-130/80-82 per patient  November 2023: Doesn't write down but is in 119'J systolic per patient -Current dietary habits: eats a lot of vegetables. Minimal meat.  -Current exercise habits: rides exercise bike at home.  -Denies hypotensive/hypertensive symptoms -Educated on BP goals and benefits of medications for prevention of heart attack, stroke and kidney damage; Daily salt intake goal < 2300 mg; Exercise goal of 150 minutes per week; Importance of home blood pressure monitoring; -Counseled to monitor BP at home weekly, document, and provide log at future appointments -Counseled on diet and exercise extensively November 2023: BP elevated, will write down for next week and CCM team will reach out   Hyperlipidemia: (LDL goal < 100) The ASCVD Risk score (Arnett DK, et al., 2019) failed to calculate for the following reasons:   The valid total cholesterol range is 130 to 320 mg/dL Lab Results  Component Value Date   CHOL 120 11/05/2021   CHOL 132 04/30/2021   CHOL 125 01/24/2021   Lab Results  Component Value Date   HDL 37 (L) 11/05/2021   HDL 34 (L) 04/30/2021   HDL 37 (L) 01/24/2021   Lab Results  Component Value Date   LDLCALC 55  11/05/2021   LDLCALC 67 04/30/2021   LDLCALC 66 01/24/2021   Lab Results  Component Value Date   TRIG 164 (H) 11/05/2021   TRIG 186 (H) 04/30/2021   TRIG 124 01/24/2021   Lab Results  Component Value Date   CHOLHDL 3.2 11/05/2021   CHOLHDL 3.9 04/30/2021   CHOLHDL 3.4 01/24/2021  No results found for: "LDLDIRECT" -controlled -Current treatment: rosuvastatin 20 mg daily Appropriate, Effective, Safe, Accessible Aspirin 81 mg EC 2 tablets daily Appropriate, Effective, Safe, Accessible -Medications previously tried: none reported  -Current dietary patterns: eats mostly vegetables. Reports small bag of popcorn at night for supper.   -Current exercise habits:  bought an exercise bike from his neighbor recently and working to improve stamins. Using bike several times a day and walking dog outdoors.  -Educated on Cholesterol goals;  Benefits of statin for ASCVD risk reduction; Importance of limiting foods high in cholesterol; Exercise goal of 150 minutes per week; -Counseled on diet and exercise extensively Recommended to continue current medication   COPD (Goal: control symptoms and prevent exacerbations) -controlled -Current treatment  Albuterol 2 puffs every 6 hours prn wheezing or shortness of breath Appropriate, Effective, Safe, Accessible -Medications previously tried: none reported  -MMRC/CAT score:      No data to display        -Pulmonary function testing: none available in chart -Exacerbations requiring treatment in last 6 months: patient denies -Patient denies consistent use of maintenance inhaler -Frequency of rescue inhaler use: uses weekly  -Counseled on Proper inhaler technique; -Counseled on diet and exercise extensively Recommended to continue current medication, patient not interested in increasing   Depression/Anxiety (Goal: manage anxiety) -controlled -Current treatment: Alprazolam 1 mg bid - Appropriate, Effective, Safe, Accessible -Medications  previously tried/failed:  clonazepam -PHQ9:     11/05/2021    9:49 AM 04/30/2021   10:51 AM 01/24/2021   10:19 AM  Depression screen PHQ 2/9  Decreased Interest 0 0 0  Down, Depressed, Hopeless 0 0 0  PHQ - 2 Score 0 0 0  Altered sleeping 0    Tired, decreased energy 0    Change in appetite 0    Feeling bad or failure about yourself  0    Trouble concentrating 0    Moving slowly or fidgety/restless 0    Suicidal thoughts 0    PHQ-9 Score 0    Difficult doing work/chores Not difficult at all    -GAD7:     03/27/2020    2:23 PM  GAD 7 : Generalized Anxiety Score  Nervous, Anxious, on Edge 1  Control/stop worrying 1  Worry too much - different things 1  Trouble relaxing 1  Restless 1  Easily annoyed or irritable 1  Afraid - awful might happen 1  Total GAD 7 Score 7  Anxiety Difficulty Not difficult at all  -Educated on Benefits of medication for symptom control Benefits of cognitive-behavioral therapy with or without medication -Recommended to continue current medication   Tobacco use (Goal reduce smoking) -uncontrolled -Current treatment  None reported -Patient smokes Within 30 minutes of waking -Patient triggers include: drinking coffee -On a scale of 1-10, reports MOTIVATION to quit is 1 -On a scale of 1-10, reports CONFIDENCE in quitting is 1 -Previous quit attempts: Chantix (suicide) -Provided contact information for  Quit Line (1-800-QUIT-NOW) and encouraged patient to reach out to this group for support. -Counseled on slowly reducing amount of cigarettes daily November 2023: Smoking 10-12/day. Was at 52-18 last month. Counseled on importance of quitting   Pain (Goal: manage pain) -controlled -Current treatment  Tramadol 50 mg qid prn Appropriate, Effective, Safe, Accessible -Medications previously tried: none reported  Feb 2023: Patient didn't state pain scale, but stated content on therapy. Recommended to continue current medication November 2023: Didn't  state pain scale        Patient Goals/Self-Care Activities Over the next 90 days, patient will:  - take medications as prescribed focus on medication adherence by using pill box   Follow Up Plan: Telephone follow up appointment with care management team member scheduled for: May 2025  [place] Constable, Pharm.D. [phone]     Care Gaps: Last annual wellness visit? 11-05-21 Tdap overdue   Star Rating Drugs: Rosuvastatin 20 mg- Last filled 10-19-2021 100 DS  Medication Assistance: None required.  Patient affirms current coverage meets needs.  Patient's preferred pharmacy is:  [employer] ([place], [facility] [address] [place] [address] Phone: [phone] Fax: [phone]  [id number] [address]Phone: [phone] Fax: [address] [place] [address]Phone: [phone]: [phone]  Uses pill box? Yes Pt endorses good compliance  We discussed: Current pharmacy is preferred with insurance plan and patient is satisfied with pharmacy services Patient decided to: Continue current medication management strategy  Care Plan and Follow Up Patient Decision:  Patient agrees to Care Plan and Follow-up.  Plan: Telephone follow up appointment with care management team member scheduled for:  May 2023 .   NOvid Curd  Charlynn Grimes.D. - [phone]

## 2021-12-17 NOTE — Patient Instructions (Signed)
Visit Information   Goals Addressed   None    Patient Care Plan: CCM Pharmacy Care Plan     Problem Identified: hyperlipidemia, hypertension, anxiety, COPD, tobacco use   Priority: High  Onset Date: 03/27/2020     Long-Range Goal: Disease Management   Start Date: 03/27/2020  Expected End Date: 03/27/2021  Recent Progress: On track  Priority: High  Note:    Current Barriers:  Unable to independently monitor therapeutic efficacy   Pharmacist Clinical Goal(s):  Over the next 90 days, patient will achieve adherence to monitoring guidelines and medication adherence to achieve therapeutic efficacy through collaboration with PharmD and provider.      Interventions: 1:1 collaboration with Blane Ohara, MD regarding development and update of comprehensive plan of care as evidenced by provider attestation and co-signature Inter-disciplinary care team collaboration (see longitudinal plan of care) Comprehensive medication review performed; medication list updated in electronic medical record   Hypertension (BP goal <140/90) -Not controlled -Current treatment: None -Medications previously tried: amlodipine, lisinopril  -Current home readings:   Feb 2023: Doesn't write but normal 125-130/80-82 per patient  November 2023: Doesn't write down but is in 150's systolic per patient -Current dietary habits: eats a lot of vegetables. Minimal meat.  -Current exercise habits: rides exercise bike at home.  -Denies hypotensive/hypertensive symptoms -Educated on BP goals and benefits of medications for prevention of heart attack, stroke and kidney damage; Daily salt intake goal < 2300 mg; Exercise goal of 150 minutes per week; Importance of home blood pressure monitoring; -Counseled to monitor BP at home weekly, document, and provide log at future appointments -Counseled on diet and exercise extensively November 2023: BP elevated, will write down for next week and CCM team will reach out    Hyperlipidemia: (LDL goal < 100) The ASCVD Risk score (Arnett DK, et al., 2019) failed to calculate for the following reasons:   The valid total cholesterol range is 130 to 320 mg/dL Lab Results  Component Value Date   CHOL 120 11/05/2021   CHOL 132 04/30/2021   CHOL 125 01/24/2021   Lab Results  Component Value Date   HDL 37 (L) 11/05/2021   HDL 34 (L) 04/30/2021   HDL 37 (L) 01/24/2021   Lab Results  Component Value Date   LDLCALC 55 11/05/2021   LDLCALC 67 04/30/2021   LDLCALC 66 01/24/2021   Lab Results  Component Value Date   TRIG 164 (H) 11/05/2021   TRIG 186 (H) 04/30/2021   TRIG 124 01/24/2021   Lab Results  Component Value Date   CHOLHDL 3.2 11/05/2021   CHOLHDL 3.9 04/30/2021   CHOLHDL 3.4 01/24/2021  No results found for: "LDLDIRECT" -controlled -Current treatment: rosuvastatin 20 mg daily Appropriate, Effective, Safe, Accessible Aspirin 81 mg EC 2 tablets daily Appropriate, Effective, Safe, Accessible -Medications previously tried: none reported  -Current dietary patterns: eats mostly vegetables. Reports small bag of popcorn at night for supper.   -Current exercise habits:  bought an exercise bike from his neighbor recently and working to improve stamins. Using bike several times a day and walking dog outdoors.  -Educated on Cholesterol goals;  Benefits of statin for ASCVD risk reduction; Importance of limiting foods high in cholesterol; Exercise goal of 150 minutes per week; -Counseled on diet and exercise extensively Recommended to continue current medication   COPD (Goal: control symptoms and prevent exacerbations) -controlled -Current treatment  Albuterol 2 puffs every 6 hours prn wheezing or shortness of breath Appropriate, Effective, Safe, Accessible -Medications previously tried:  none reported  -MMRC/CAT score:      No data to display        -Pulmonary function testing: none available in chart -Exacerbations requiring treatment in last 6  months: patient denies -Patient denies consistent use of maintenance inhaler -Frequency of rescue inhaler use: uses weekly  -Counseled on Proper inhaler technique; -Counseled on diet and exercise extensively Recommended to continue current medication, patient not interested in increasing   Depression/Anxiety (Goal: manage anxiety) -controlled -Current treatment: Alprazolam 1 mg bid - Appropriate, Effective, Safe, Accessible -Medications previously tried/failed: clonazepam -PHQ9:     11/05/2021    9:49 AM 04/30/2021   10:51 AM 01/24/2021   10:19 AM  Depression screen PHQ 2/9  Decreased Interest 0 0 0  Down, Depressed, Hopeless 0 0 0  PHQ - 2 Score 0 0 0  Altered sleeping 0    Tired, decreased energy 0    Change in appetite 0    Feeling bad or failure about yourself  0    Trouble concentrating 0    Moving slowly or fidgety/restless 0    Suicidal thoughts 0    PHQ-9 Score 0    Difficult doing work/chores Not difficult at all    -GAD7:     03/27/2020    2:23 PM  GAD 7 : Generalized Anxiety Score  Nervous, Anxious, on Edge 1  Control/stop worrying 1  Worry too much - different things 1  Trouble relaxing 1  Restless 1  Easily annoyed or irritable 1  Afraid - awful might happen 1  Total GAD 7 Score 7  Anxiety Difficulty Not difficult at all  -Educated on Benefits of medication for symptom control Benefits of cognitive-behavioral therapy with or without medication -Recommended to continue current medication   Tobacco use (Goal reduce smoking) -uncontrolled -Current treatment  None reported -Patient smokes Within 30 minutes of waking -Patient triggers include: drinking coffee -On a scale of 1-10, reports MOTIVATION to quit is 1 -On a scale of 1-10, reports CONFIDENCE in quitting is 1 -Previous quit attempts: Chantix (suicide) -Provided contact information for [place] [facility]uit Line (1-800-QUIT-NOW) and encouraged patient to reach out to this group for support. -Counseled on  slowly reducing amount of cigarettes daily November 2023: Smoking 10-12/day. Was at 14-18 last month. Counseled on importance of quitting   Pain (Goal: manage pain) -controlled -Current treatment  Tramadol 50 mg qid prn Appropriate, Effective, Safe, Accessible -Medications previously tried: none reported  Feb 2023: Patient didn't state pain scale, but stated content on therapy. Recommended to continue current medication November 2023: Didn't state pain scale        Patient Goals/Self-Care Activities Over the next 90 days, patient will:  - take medications as prescribed focus on medication adherence by using pill box   Follow Up Plan: Telephone follow up appointment with care management team member scheduled for: May 2025  Artelia Laroche, Pharm.D. - [phone]      Mr. Hoog was given information about Chronic Care Management services today including:  CCM service includes personalized support from designated clinical staff supervised by his physician, including individualized plan of care and coordination with other care providers 24/7 contact phone numbers for assistance for urgent and routine care needs. Standard insurance, coinsurance, copays and deductibles apply for chronic care management only during months in which we provide at least 20 minutes of these services. Most insurances cover these services at 100%, however patients may be responsible for any copay, coinsurance and/or deductible if applicable. This service  may help you avoid the need for more expensive face-to-face services. Only one practitioner may furnish and bill the service in a calendar month. The patient may stop CCM services at any time (effective at the end of the month) by phone call to the office staff.  Patient agreed to services and verbal consent obtained.   The patient verbalized understanding of instructions, educational materials, and care plan provided today and DECLINED offer to receive copy  of patient instructions, educational materials, and care plan.  The pharmacy team will reach out to the patient again over the next 60 days.   Lane Hacker, [place]

## 2021-12-22 ENCOUNTER — Other Ambulatory Visit: Payer: Medicare Other

## 2021-12-25 ENCOUNTER — Telehealth: Payer: Self-pay

## 2021-12-25 NOTE — Progress Notes (Signed)
    Chronic Care Management Pharmacy Assistant   Name: Gregory Delgado  MRN: [id number] DOB: August 16, 1950   Reason for Encounter: Blood pressure readings  12-25-2021: 1st attempt left VM 12-30-2021: 11-07 135/90 133/86, 11-08 131/84 115/79, 11-09 124/83 125/88, 11-10 117/80, 11-11 137/89, 11-12 110/80, 11-13 107/79, 11-14 117/83, 11-15 144/98, 11-16 141/87, 11-17 141/86, 11-19 137/85    Medications: Outpatient Encounter Medications as of 12/25/2021  Medication Sig Note   albuterol (VENTOLIN HFA) 108 (90 Base) MCG/ACT inhaler USE 2 INHALATIONS BY MOUTH  INTO THE LUNGS EVERY 6  HOURS AS NEEDED FOR  WHEEZING OR SHORTNESS OF  BREATH    ALPRAZolam (XANAX) 1 MG tablet Take 1 mg by mouth 2 (two) times daily.  10/24/2020: Prescribed by Barbette Merino   Ascorbic Acid (VITAMIN C) 1000 MG tablet Take 1,000 mg by mouth daily.    aspirin 81 MG EC tablet Take 162 mg by mouth daily at 10 pm.    cholecalciferol (VITAMIN D3) 25 MCG (1000 UNIT) tablet Take 1,000 Units by mouth daily.    ciprofloxacin (CILOXAN) 0.3 % ophthalmic solution     docusate sodium (COLACE) 100 MG capsule Take 100 mg by mouth daily.    Multiple Vitamins-Minerals (CENTRUM SILVER 50+MEN) TABS Take by mouth.    pantoprazole (PROTONIX) 40 MG tablet TAKE 1 TABLET BY MOUTH  TWICE DAILY    rosuvastatin (CRESTOR) 20 MG tablet TAKE 1 TABLET BY MOUTH  DAILY    traMADol (ULTRAM) 50 MG tablet TAKE 2 TABLETS BY MOUTH TWICE A DAY    No facility-administered encounter medications on file as of 12/25/2021.    Huey Romans [facility] Clinical Pharmacist Assistant [phone]

## 2021-12-31 ENCOUNTER — Other Ambulatory Visit: Payer: Self-pay | Admitting: Family Medicine

## 2021-12-31 DIAGNOSIS — G8929 Other chronic pain: Secondary | ICD-10-CM

## 2022-01-09 DIAGNOSIS — I10 Essential (primary) hypertension: Secondary | ICD-10-CM

## 2022-01-09 DIAGNOSIS — E782 Mixed hyperlipidemia: Secondary | ICD-10-CM

## 2022-01-16 ENCOUNTER — Telehealth: Payer: Self-pay

## 2022-01-16 NOTE — Chronic Care Management (AMB) (Signed)
Chronic Care Management Pharmacy Assistant   Name: Gregory Delgado  MRN: [id number] DOB: 1950-04-11  Reason for Encounter: Disease State/ Hypertension  Recent office visits:  None  Recent consult visits:  None  Hospital visits:  None in previous 6 months  Medications: Outpatient Encounter Medications as of 01/16/2022  Medication Sig Note   albuterol (VENTOLIN HFA) 108 (90 Base) MCG/ACT inhaler USE 2 INHALATIONS BY MOUTH  INTO THE LUNGS EVERY 6  HOURS AS NEEDED FOR  WHEEZING OR SHORTNESS OF  BREATH    ALPRAZolam (XANAX) 1 MG tablet Take 1 mg by mouth 2 (two) times daily.  10/24/2020: Prescribed by Chaya Jan   Ascorbic Acid (VITAMIN C) 1000 MG tablet Take 1,000 mg by mouth daily.    aspirin 81 MG EC tablet Take 162 mg by mouth daily at 10 pm.    cholecalciferol (VITAMIN D3) 25 MCG (1000 UNIT) tablet Take 1,000 Units by mouth daily.    ciprofloxacin (CILOXAN) 0.3 % ophthalmic solution     docusate sodium (COLACE) 100 MG capsule Take 100 mg by mouth daily.    Multiple Vitamins-Minerals (CENTRUM SILVER 50+MEN) TABS Take by mouth.    pantoprazole (PROTONIX) 40 MG tablet TAKE 1 TABLET BY MOUTH  TWICE DAILY    rosuvastatin (CRESTOR) 20 MG tablet TAKE 1 TABLET BY MOUTH  DAILY    traMADol (ULTRAM) 50 MG tablet TAKE 2 TABLETS BY MOUTH TWICE A DAY    No facility-administered encounter medications on file as of 01/16/2022.   Recent Office Vitals: BP Readings from Last 3 Encounters:  11/05/21 116/82  04/30/21 130/80  01/24/21 136/72   Pulse Readings from Last 3 Encounters:  11/05/21 87  04/30/21 88  01/24/21 100    Wt Readings from Last 3 Encounters:  11/05/21 188 lb (85.3 kg)  04/30/21 180 lb (81.6 kg)  01/24/21 182 lb (82.6 kg)     Kidney Function Lab Results  Component Value Date/Time   CREATININE 1.12 11/05/2021 10:34 AM   CREATININE 1.30 (H) 04/30/2021 11:28 AM   GFRNONAA 67 10/05/2019 11:47 AM   GFRAA 78 10/05/2019 11:47 AM       Latest Ref Rng & Units  11/05/2021   10:34 AM 04/30/2021   11:28 AM 01/24/2021   11:12 AM  BMP  Glucose 70 - 99 mg/dL 108  114  113   BUN 8 - 27 mg/dL 10  13  11    Creatinine 0.76 - 1.27 mg/dL 1.12  1.30  1.39   BUN/Creat Ratio 10 - 24 9  10  8    Sodium 134 - 144 mmol/L 140  145  139   Potassium 3.5 - 5.2 mmol/L 4.7  5.0  4.4   Chloride 96 - 106 mmol/L 101  103  100   CO2 20 - 29 mmol/L 24  23  26    Calcium 8.6 - 10.2 mg/dL 9.5  10.0  9.3      Current antihypertensive regimen:  None  Do you receive your medications through PAP? No  If Yes, what is the status?   How often are you checking your Blood Pressure? 3-5x per week  Current home BP readings: 12-06 119/84, 12-05 147/86, 12-04 140/89, 12-03 130/80, 12-01 124/84, 11-27 141/83 Wrist or arm cuff: arm Caffeine intake: 3 cups coffee in am, 2 can sodas mid day and water at night Salt intake: Limited OTC medications including pseudoephedrine or NSAIDs? None  Any readings above 180/120? No  What recent interventions/DTPs have been made by  any provider to improve Blood Pressure control since last CPP Visit:  Educated on BP goals and benefits of medications for prevention of heart attack, stroke and kidney damage; Daily salt intake goal < 2300 mg; Exercise goal of 150 minutes per week; Importance of home blood pressure monitoring; -Counseled to monitor BP at home weekly, document, and provide log at future appointments -Counseled on diet and exercise extensively  Any recent hospitalizations or ED visits since last visit with CPP? No  What diet changes have been made to improve Blood Pressure Control?  Patient stated he doesn't add salt to food and drinks plenty of water.  What exercise is being done to improve your Blood Pressure Control?  Patient stated he walks 2 miles daily with his dog   Adherence Review: Is the patient currently on ACE/ARB medication? No Does the patient have >5 day gap between last estimated fill dates? CPP to  review  Care Gaps: Last annual wellness visit? 11-05-2021 Tdap overdue Lung cancer screening overdue  Star Rating Drugs: Rosuvastatin 20 mg- Last filled 10-16-2021 100 DS. Previous 07-16-2021 100 DS  Huey Romans [facility] Clinical Pharmacist Assistant [phone]

## 2022-01-21 ENCOUNTER — Other Ambulatory Visit: Payer: Self-pay | Admitting: Family Medicine

## 2022-01-21 MED ORDER — LOSARTAN POTASSIUM 25 MG PO TABS: 25.0000 mg | ORAL_TABLET | Freq: Every day | ORAL | 0 refills | Status: DC

## 2022-01-24 NOTE — Telephone Encounter (Signed)
BP last night was 151 systolic last night. Explained new med and to start taking it when it comes in

## 2022-02-21 ENCOUNTER — Telehealth: Payer: Self-pay

## 2022-02-21 NOTE — Progress Notes (Signed)
Care Management & Coordination Services Pharmacy Team  Reason for Encounter: Hypertension  Contacted patient on 02-21-2022 to discuss hypertension disease state.   Recent office visits:  None  Recent consult visits:  None  Hospital visits:  None in previous 6 months  Medications: Outpatient Encounter Medications as of 02/21/2022  Medication Sig Note   losartan (COZAAR) 25 MG tablet Take 1 tablet (25 mg total) by mouth daily.    albuterol (VENTOLIN HFA) 108 (90 Base) MCG/ACT inhaler USE 2 INHALATIONS BY MOUTH  INTO THE LUNGS EVERY 6  HOURS AS NEEDED FOR  WHEEZING OR SHORTNESS OF  BREATH    ALPRAZolam (XANAX) 1 MG tablet Take 1 mg by mouth 2 (two) times daily.  10/24/2020: Prescribed by Chaya Jan   Ascorbic Acid (VITAMIN C) 1000 MG tablet Take 1,000 mg by mouth daily.    aspirin 81 MG EC tablet Take 162 mg by mouth daily at 10 pm.    cholecalciferol (VITAMIN D3) 25 MCG (1000 UNIT) tablet Take 1,000 Units by mouth daily.    ciprofloxacin (CILOXAN) 0.3 % ophthalmic solution     docusate sodium (COLACE) 100 MG capsule Take 100 mg by mouth daily.    Multiple Vitamins-Minerals (CENTRUM SILVER 50+MEN) TABS Take by mouth.    pantoprazole (PROTONIX) 40 MG tablet TAKE 1 TABLET BY MOUTH  TWICE DAILY    rosuvastatin (CRESTOR) 20 MG tablet TAKE 1 TABLET BY MOUTH  DAILY    traMADol (ULTRAM) 50 MG tablet TAKE 2 TABLETS BY MOUTH TWICE A DAY    No facility-administered encounter medications on file as of 02/21/2022.    Recent Office Vitals: BP Readings from Last 3 Encounters:  11/05/21 116/82  04/30/21 130/80  01/24/21 136/72   Pulse Readings from Last 3 Encounters:  11/05/21 87  04/30/21 88  01/24/21 100    Wt Readings from Last 3 Encounters:  11/05/21 188 lb (85.3 kg)  04/30/21 180 lb (81.6 kg)  01/24/21 182 lb (82.6 kg)     Kidney Function Lab Results  Component Value Date/Time   CREATININE 1.12 11/05/2021 10:34 AM   CREATININE 1.30 (H) 04/30/2021 11:28 AM   GFRNONAA 67  10/05/2019 11:47 AM   GFRAA 78 10/05/2019 11:47 AM       Latest Ref Rng & Units 11/05/2021   10:34 AM 04/30/2021   11:28 AM 01/24/2021   11:12 AM  BMP  Glucose 70 - 99 mg/dL 108  114  113   BUN 8 - 27 mg/dL 10  13  11    Creatinine 0.76 - 1.27 mg/dL 1.12  1.30  1.39   BUN/Creat Ratio 10 - 24 9  10  8    Sodium 134 - 144 mmol/L 140  145  139   Potassium 3.5 - 5.2 mmol/L 4.7  5.0  4.4   Chloride 96 - 106 mmol/L 101  103  100   CO2 20 - 29 mmol/L 24  23  26    Calcium 8.6 - 10.2 mg/dL 9.5  10.0  9.3      Current antihypertensive regimen:  Losartan 25 mg daily  Patient verbally confirms he is taking the above medications as directed. Yes  How often are you checking your Blood Pressure? 3-5x per week  he checks his blood pressure in the morning after taking his medication.  Current home BP readings: 12-28 123/88, 12-31 134/81, 02-11-99 124/84, 01-08 121/74, 01-10 138/84 Wrist or arm cuff: Arm Caffeine intake: 3 cups of coffee daily and down to 1 can of  soda today Salt intake:Limited  OTC medications including pseudoephedrine or NSAIDs? None  Any readings above 180/120? No  What recent interventions/DTPs have been made by any provider to improve Blood Pressure control since last CPP Visit: Patient stated he has cut back on drinking soda.  Any recent hospitalizations or ED visits since last visit with CPP? No  What diet changes have been made to improve Blood Pressure Control?  Patient stated he doesn't add salt to food and drinks plenty of water.  What exercise is being done to improve your Blood Pressure Control?  Patient stated he walks 2 miles daily with his dog    Adherence Review: Is the patient currently on ACE/ARB medication? Yes Does the patient have >5 day gap between last estimated fill dates? No  Star Rating Drugs:  Rosuvastatin 20 mg- Last filled 01-23-2022 100 DS. Previous 10-16-2021 100 DS  Losartan 25 mg- Last filled 01-23-2022 90 DS. No previous   [employer]

## 2022-02-22 ENCOUNTER — Other Ambulatory Visit: Payer: Self-pay | Admitting: Family Medicine

## 2022-02-27 ENCOUNTER — Other Ambulatory Visit: Payer: Self-pay | Admitting: Family Medicine

## 2022-02-27 DIAGNOSIS — G8929 Other chronic pain: Secondary | ICD-10-CM

## 2022-03-06 DIAGNOSIS — H6123 Impacted cerumen, bilateral: Secondary | ICD-10-CM | POA: Diagnosis not present

## 2022-03-06 DIAGNOSIS — H60543 Acute eczematoid otitis externa, bilateral: Secondary | ICD-10-CM | POA: Diagnosis not present

## 2022-03-11 ENCOUNTER — Encounter: Payer: Self-pay | Admitting: Family Medicine

## 2022-03-11 ENCOUNTER — Ambulatory Visit (INDEPENDENT_AMBULATORY_CARE_PROVIDER_SITE_OTHER): Payer: Medicare Other | Admitting: Family Medicine

## 2022-03-11 VITALS — BP 128/68 | HR 76 | Temp 97.4°F | Ht 68.0 in | Wt 186.0 lb

## 2022-03-11 DIAGNOSIS — M545 Low back pain, unspecified: Secondary | ICD-10-CM | POA: Diagnosis not present

## 2022-03-11 DIAGNOSIS — E782 Mixed hyperlipidemia: Secondary | ICD-10-CM

## 2022-03-11 DIAGNOSIS — K219 Gastro-esophageal reflux disease without esophagitis: Secondary | ICD-10-CM

## 2022-03-11 DIAGNOSIS — I10 Essential (primary) hypertension: Secondary | ICD-10-CM

## 2022-03-11 DIAGNOSIS — F17219 Nicotine dependence, cigarettes, with unspecified nicotine-induced disorders: Secondary | ICD-10-CM | POA: Diagnosis not present

## 2022-03-11 DIAGNOSIS — R7301 Impaired fasting glucose: Secondary | ICD-10-CM | POA: Diagnosis not present

## 2022-03-11 MED ORDER — ALBUTEROL SULFATE HFA 108 (90 BASE) MCG/ACT IN AERS: INHALATION_SPRAY | RESPIRATORY_TRACT | 3 refills | Status: DC

## 2022-03-11 MED ORDER — PANTOPRAZOLE SODIUM 40 MG PO TBEC: 40.0000 mg | DELAYED_RELEASE_TABLET | Freq: Every day | ORAL | 3 refills | Status: DC

## 2022-03-11 NOTE — Progress Notes (Signed)
Subjective:  Patient ID: Gregory Delgado, male    DOB: Jul 03, 1950  Age: 72 y.o. MRN: [id number]  Chief Complaint  Patient presents with   Hyperlipidemia   Hypertension   HPI: Hyperlipidemia:  Patient is currently taking Rosuvastatin 20 mg take 1 tablet daily.  Hypertension: Patient stopped his amlodipine 2 months ago when his bp was running a little low.   BP runs in 120-140s/60-80.   GERD: Patient is currently taking Pantoprazole 40 mg take 1 tablet daily.  Left shoulder and  lumbar back pain. Taking tramadol 50 mg 2 oral twice daily.   Patient has never had a steroid injection.  Left shoulder mri (2014)  IMPRESSION:  1.  Subacute fracture of the greater tuberosity with no significant  displacement.  Healing has  begun.  2.  Intact rotator cuff.  3.  Small amount of blood in the joint and in the subdeltoid bursa.  4.  Probable partial tear of the anterior aspect of the inferior  glenohumeral ligament.    Sees psychiatry for xanax.       03/11/2022    8:59 AM 11/05/2021    9:49 AM 04/30/2021   10:51 AM 01/24/2021   10:19 AM 10/17/2020   10:53 AM  Depression screen PHQ 2/9  Decreased Interest 0 0 0 0 1  Down, Depressed, Hopeless 0 0 0 0 0  PHQ - 2 Score 0 0 0 0 1  Altered sleeping 0 0   1  Tired, decreased energy 1 0   1  Change in appetite 0 0   0  Feeling bad or failure about yourself  0 0   0  Trouble concentrating 0 0   0  Moving slowly or fidgety/restless 0 0   0  Suicidal thoughts 0 0   0  PHQ-9 Score 1 0   3  Difficult doing work/chores Not difficult at all Not difficult at all   Not difficult at all         01/24/2021   10:19 AM 04/30/2021   10:51 AM 11/05/2021   10:03 AM 11/07/2021    9:47 PM 03/11/2022    9:00 AM  Fall Risk  Falls in the past year? 0 0 0  0  Was there an injury with Fall? 0 0 0  0  Fall Risk Category Calculator 0 0 0  0  Fall Risk Category (Retired) Low Low Low    (RETIRED) Patient Fall Risk Level   Low fall risk    Patient at  Risk for Falls Due to    No Fall Risks No Fall Risks  Fall risk Follow up Falls evaluation completed Falls evaluation completed Falls evaluation completed Falls evaluation completed;Education provided Falls evaluation completed      Review of Systems  Constitutional:  Negative for appetite change, fatigue and fever.  HENT:  Negative for congestion, ear pain, sinus pressure and sore throat.   Respiratory:  Negative for cough, chest tightness, shortness of breath and wheezing.   Cardiovascular:  Negative for chest pain and palpitations.  Gastrointestinal:  Negative for abdominal pain, constipation, diarrhea, nausea and vomiting.  Genitourinary:  Negative for dysuria and hematuria.  Musculoskeletal:  Negative for arthralgias, back pain, joint swelling and myalgias.  Skin:  Negative for rash.  Neurological:  Negative for dizziness, weakness and headaches.  Psychiatric/Behavioral:  Negative for dysphoric mood. The patient is not nervous/anxious.     Current Outpatient Medications on File Prior to Visit  Medication Sig Dispense Refill  Ascorbic Acid (VITAMIN C) 1000 MG tablet Take 1,000 mg by mouth daily.     aspirin 81 MG EC tablet Take 162 mg by mouth daily at 10 pm.     cholecalciferol (VITAMIN D3) 25 MCG (1000 UNIT) tablet Take 1,000 Units by mouth daily.     docusate sodium (COLACE) 100 MG capsule Take 100 mg by mouth daily.     Multiple Vitamins-Minerals (CENTRUM SILVER 50+MEN) TABS Take by mouth.     rosuvastatin (CRESTOR) 20 MG tablet TAKE 1 TABLET BY MOUTH  DAILY 90 tablet 3   traMADol (ULTRAM) 50 MG tablet TAKE 2 TABLETS BY MOUTH TWICE A DAY (Patient not taking: Reported on 03/11/2022) 120 tablet 1   No current facility-administered medications on file prior to visit.   Past Medical History:  Diagnosis Date   COPD (chronic obstructive pulmonary disease) (HCC)    GERD (gastroesophageal reflux disease)    Hyperlipidemia    Hypertension    Osteoarthritis    Past Surgical  History:  Procedure Laterality Date   CERVICAL DISC SURGERY  2015   C2-C4    KNEE SURGERY  1976    Family History  Problem Relation Age of Onset   Diabetes Mother    Stroke Mother    Alcoholism Father    CVA Sister    Stroke Sister    Congenital heart disease Daughter    Rectal cancer Son    Cirrhosis Son    Cancer - Colon Son    Social History   Socioeconomic History   Marital status: Single    Spouse name: Not on file   Number of children: Not on file   Years of education: Not on file   Highest education level: Not on file  Occupational History   Occupation: Retired Administrator  Tobacco Use   Smoking status: Every Day    Packs/day: 0.50    Years: 55.00    Total pack years: 27.50    Types: Cigarettes   Smokeless tobacco: Never  Vaping Use   Vaping Use: Never used  Substance and Sexual Activity   Alcohol use: Never   Drug use: Never   Sexual activity: Not on file  Other Topics Concern   Not on file  Social History Narrative   Not on file   Social Determinants of Health   Financial Resource Strain: Low Risk  (11/05/2021)   Overall Financial Resource Strain (CARDIA)    Difficulty of Paying Living Expenses: Not hard at all  Food Insecurity: No Food Insecurity (11/05/2021)   Hunger Vital Sign    Worried About Running Out of Food in the Last Year: Never true    Ran Out of Food in the Last Year: Never true  Transportation Needs: No Transportation Needs (12/17/2021)   PRAPARE - Hydrologist (Medical): No    Lack of Transportation (Non-Medical): No  Physical Activity: Inactive (11/05/2021)   Exercise Vital Sign    Days of Exercise per Week: 0 days    Minutes of Exercise per Session: 0 min  Stress: No Stress Concern Present (11/05/2021)   [place]    Feeling of Stress : Not at all  Social Connections: Socially Isolated (11/05/2021)   Social Connection and Isolation  Panel [NHANES]    Frequency of Communication with Friends and Family: More than three times a week    Frequency of Social Gatherings with Friends and Family: More than three  times a week    Attends Religious Services: Never    Active Member of Clubs or Organizations: No    Attends Engineer, structural: Never    Marital Status: Divorced    Objective:  BP 128/68 (BP Location: Left Arm, Patient Position: Sitting)   Pulse 76   Temp (!) 97.4 F (36.3 C) (Temporal)   Ht 5\' 8"  (1.727 m)   Wt 186 lb (84.4 kg)   SpO2 97%   BMI 28.28 kg/m      03/11/2022    9:20 AM 11/05/2021    9:53 AM 04/30/2021   10:44 AM  BP/Weight  Systolic BP 128 116 130  Diastolic BP 68 82 80  Wt. (Lbs) 186 188 180  BMI 28.28 kg/m2 28.59 kg/m2 27.37 kg/m2    Physical Exam Vitals reviewed.  Constitutional:      Appearance: Normal appearance. He is normal weight.  Neck:     Vascular: No carotid bruit.  Cardiovascular:     Rate and Rhythm: Normal rate and regular rhythm.     Heart sounds: Normal heart sounds.  Pulmonary:     Effort: Pulmonary effort is normal.     Breath sounds: Normal breath sounds.  Abdominal:     General: Abdomen is flat. Bowel sounds are normal.     Palpations: Abdomen is soft.  Neurological:     Mental Status: He is alert and oriented to person, place, and time.  Psychiatric:        Mood and Affect: Mood normal.        Behavior: Behavior normal.     Diabetic Foot Exam - Simple   No data filed      Lab Results  Component Value Date   WBC 9.3 03/11/2022   HGB 14.4 03/11/2022   HCT 44.1 03/11/2022   PLT 268 03/11/2022   GLUCOSE 106 (H) 03/11/2022   CHOL 127 03/11/2022   TRIG 151 (H) 03/11/2022   HDL 35 (L) 03/11/2022   LDLCALC 66 03/11/2022   ALT 16 03/11/2022   AST 22 03/11/2022   NA 139 03/11/2022   K 4.7 03/11/2022   CL 101 03/11/2022   CREATININE 1.13 03/11/2022   BUN 11 03/11/2022   CO2 23 03/11/2022   TSH 1.570 11/05/2021   HGBA1C 6.0 (H)  03/11/2022      Assessment & Plan:    Mixed hyperlipidemia Assessment & Plan: Well controlled.  No changes to medicines. Continue rosuvastatin 20 mg before bed.  Continue to work on eating a healthy diet and exercise.  Labs drawn today.    Orders: -     CBC with Differential/Platelet -     Comprehensive metabolic panel -     Lipid panel -     Cardiovascular Risk Assessment  Essential hypertension, benign Assessment & Plan: On no medications.  Check labs.    Impaired fasting glucose Assessment & Plan: Recommend continue to work on eating healthy diet and exercise.   Orders: -     Hemoglobin A1c  Cigarette nicotine dependence with nicotine-induced disorder Assessment & Plan: Recommend cessation. Patient not ready to quit.    Lumbar back pain Assessment & Plan: Continue tramadol 50 mg 2 oral twice daily.    GERD without esophagitis Assessment & Plan: The current medical regimen is effective;  continue present plan and medications. Continue Pantoprazole 40 mg take 1 tablet daily.  Orders: -     Pantoprazole Sodium; Take 1 tablet (40 mg total) by mouth daily.  Dispense: 90 tablet; Refill: 3  Other orders -     Albuterol Sulfate HFA; USE 2 INHALATIONS BY MOUTH EVERY 6 HOURS AS NEEDED FOR WHEEZING  OR SHORTNESS OF BREATH  Dispense: 3 each; Refill: 3     Meds ordered this encounter  Medications   pantoprazole (PROTONIX) 40 MG tablet    Sig: Take 1 tablet (40 mg total) by mouth daily.    Dispense:  90 tablet    Refill:  3    Requesting 1 year supply   albuterol (VENTOLIN HFA) 108 (90 Base) MCG/ACT inhaler    Sig: USE 2 INHALATIONS BY MOUTH EVERY 6 HOURS AS NEEDED FOR WHEEZING  OR SHORTNESS OF BREATH    Dispense:  3 each    Refill:  3    Please send a replace/new response with 100-Day Supply if appropriate to maximize member benefit. Requesting 1 year supply.    Orders Placed This Encounter  Procedures   CBC with Differential/Platelet   Comprehensive  metabolic panel   Lipid panel   Hemoglobin A1c   Cardiovascular Risk Assessment     Follow-up: Return in about 6 months (around 09/09/2022) for chronic fasting.  An After Visit Summary was printed and given to the patient.   I,Lauren M Auman,acting as a scribe for [name], MD.,have documented all relevant documentation on the behalf of [name], MD,as directed by  [name], MD while in the presence of [name], MD.    [name], MD [place] [phone]

## 2022-03-11 NOTE — Patient Instructions (Addendum)
Hypertension: Stop losartan.  Amlodipine 10 mg half pill daily.  Recommend smoking cessation

## 2022-03-13 LAB — CBC WITH DIFFERENTIAL/PLATELET
Basophils Absolute: 0.1 10*3/uL (ref 0.0–0.2)
Basos: 1 %
EOS (ABSOLUTE): 0.2 10*3/uL (ref 0.0–0.4)
Eos: 2 %
Hematocrit: 44.1 % (ref 37.5–51.0)
Hemoglobin: 14.4 g/dL (ref 13.0–17.7)
Immature Grans (Abs): 0 10*3/uL (ref 0.0–0.1)
Immature Granulocytes: 0 %
Lymphocytes Absolute: 2.4 10*3/uL (ref 0.7–3.1)
Lymphs: 26 %
MCH: 30.5 pg (ref 26.6–33.0)
MCHC: 32.7 g/dL (ref 31.5–35.7)
MCV: 93 fL (ref 79–97)
Monocytes Absolute: 0.7 10*3/uL (ref 0.1–0.9)
Monocytes: 8 %
Neutrophils Absolute: 5.9 10*3/uL (ref 1.4–7.0)
Neutrophils: 63 %
Platelets: 268 10*3/uL (ref 150–450)
RBC: 4.72 x10E6/uL (ref 4.14–5.80)
RDW: 13.5 % (ref 11.6–15.4)
WBC: 9.3 10*3/uL (ref 3.4–10.8)

## 2022-03-13 LAB — COMPREHENSIVE METABOLIC PANEL
ALT: 16 IU/L (ref 0–44)
AST: 22 IU/L (ref 0–40)
Albumin/Globulin Ratio: 1.9 (ref 1.2–2.2)
Albumin: 4.6 g/dL (ref 3.9–4.9)
Alkaline Phosphatase: 113 IU/L (ref 44–121)
BUN/Creatinine Ratio: 10 (ref 10–24)
BUN: 11 mg/dL (ref 8–27)
Bilirubin Total: 0.4 mg/dL (ref 0.0–1.2)
CO2: 23 mmol/L (ref 20–29)
Calcium: 9.3 mg/dL (ref 8.6–10.2)
Chloride: 101 mmol/L (ref 96–106)
Creatinine, Ser: 1.13 mg/dL (ref 0.76–1.27)
Globulin, Total: 2.4 g/dL (ref 1.5–4.5)
Glucose: 106 mg/dL — ABNORMAL HIGH (ref 70–99)
Potassium: 4.7 mmol/L (ref 3.5–5.2)
Sodium: 139 mmol/L (ref 134–144)
Total Protein: 7 g/dL (ref 6.0–8.5)
eGFR: 70 mL/min/{1.73_m2} (ref >59)

## 2022-03-13 LAB — HEMOGLOBIN A1C
Est. average glucose Bld gHb Est-mCnc: 126 mg/dL
Hgb A1c MFr Bld: 6 % — ABNORMAL HIGH (ref 4.8–5.6)

## 2022-03-13 LAB — LIPID PANEL
Chol/HDL Ratio: 3.6 ratio (ref 0.0–5.0)
Cholesterol, Total: 127 mg/dL (ref 100–199)
HDL: 35 mg/dL — ABNORMAL LOW (ref >39)
LDL Chol Calc (NIH): 66 mg/dL (ref 0–99)
Triglycerides: 151 mg/dL — ABNORMAL HIGH (ref 0–149)
VLDL Cholesterol Cal: 26 mg/dL (ref 5–40)

## 2022-03-13 LAB — CARDIOVASCULAR RISK ASSESSMENT

## 2022-03-15 NOTE — Assessment & Plan Note (Signed)
Continue tramadol 50 mg 2 oral twice daily.

## 2022-03-15 NOTE — Assessment & Plan Note (Signed)
The current medical regimen is effective;  continue present plan and medications. Continue Pantoprazole 40 mg take 1 tablet daily. 

## 2022-03-15 NOTE — Assessment & Plan Note (Signed)
Recommend continue to work on eating healthy diet and exercise.  

## 2022-03-15 NOTE — Assessment & Plan Note (Signed)
Recommend cessation. Patient not ready to quit.

## 2022-03-15 NOTE — Assessment & Plan Note (Signed)
On no medications.  Check labs.

## 2022-03-15 NOTE — Assessment & Plan Note (Signed)
Well controlled.  No changes to medicines. Continue  rosuvastatin 20 mg before bed.  Continue to work on eating a healthy diet and exercise.  Labs drawn today.   

## 2022-03-24 ENCOUNTER — Telehealth: Payer: Self-pay

## 2022-03-24 ENCOUNTER — Other Ambulatory Visit: Payer: Self-pay

## 2022-03-24 NOTE — Progress Notes (Signed)
Care Management & Coordination Services Pharmacy Team  Reason for Encounter: Hypertension  Contacted patient to discuss hypertension disease state. Spoke with patient on 03/24/2022     Current antihypertensive regimen:  Amlodipine 10 mg daily (Sent refill request)  Patient verbally confirms he is taking the above medications as directed. Yes  How often are you checking your Blood Pressure? 3-5x per week  he checks his blood pressure in the morning after taking his medication.  Current home BP readings: 02-11 127/81, 02-10 112/74, 02-02 127/83, 02-03 140/84 Wrist or arm cuff: Arm Caffeine intake:3 cups of coffee daily and down to 1 can of soda today  Salt intake: :Limited   OTC medications including pseudoephedrine or NSAIDs? No  Any readings above 180/100? No  What recent interventions/DTPs have been made by any provider to improve Blood Pressure control since last CPP Visit: Patient stated he has cut back on drinking soda.   Any recent hospitalizations or ED visits since last visit with CPP? No  What diet changes have been made to improve Blood Pressure Control?  Patient stated he doesn't add salt to food and drinks plenty of water.   What exercise is being done to improve your Blood Pressure Control?  Patient stated he walks 2 miles daily with his dog   Adherence Review: Is the patient currently on ACE/ARB medication? No Does the patient have >5 day gap between last estimated fill dates? No  Star Rating Drugs:  Rosuvastatin 20 mg- Last filled 01-23-2022 100 DS. Previous 10-16-2021 100 DS   Chart Updates: Recent office visits:  03-11-2022 [name], MD. Glucose= 106. A1C= 6.0. Trig= 151, HDL= 35. STOP xanax and losartan.  Recent consult visits:  None  Hospital visits:  None in previous 6 months  Medications: Outpatient Encounter Medications as of 03/24/2022  Medication Sig   albuterol (VENTOLIN HFA) 108 (90 Base) MCG/ACT inhaler USE 2 INHALATIONS BY MOUTH  EVERY 6 HOURS AS NEEDED FOR WHEEZING  OR SHORTNESS OF BREATH   Ascorbic Acid (VITAMIN C) 1000 MG tablet Take 1,000 mg by mouth daily.   aspirin 81 MG EC tablet Take 162 mg by mouth daily at 10 pm.   cholecalciferol (VITAMIN D3) 25 MCG (1000 UNIT) tablet Take 1,000 Units by mouth daily.   docusate sodium (COLACE) 100 MG capsule Take 100 mg by mouth daily.   Multiple Vitamins-Minerals (CENTRUM SILVER 50+MEN) TABS Take by mouth.   pantoprazole (PROTONIX) 40 MG tablet Take 1 tablet (40 mg total) by mouth daily.   rosuvastatin (CRESTOR) 20 MG tablet TAKE 1 TABLET BY MOUTH  DAILY   traMADol (ULTRAM) 50 MG tablet TAKE 2 TABLETS BY MOUTH TWICE A DAY (Patient not taking: Reported on 03/11/2022)   No facility-administered encounter medications on file as of 03/24/2022.    Recent Office Vitals: BP Readings from Last 3 Encounters:  03/11/22 128/68  11/05/21 116/82  04/30/21 130/80   Pulse Readings from Last 3 Encounters:  03/11/22 76  11/05/21 87  04/30/21 88    Wt Readings from Last 3 Encounters:  03/11/22 186 lb (84.4 kg)  11/05/21 188 lb (85.3 kg)  04/30/21 180 lb (81.6 kg)     Kidney Function Lab Results  Component Value Date/Time   CREATININE 1.13 03/11/2022 09:53 AM   CREATININE 1.12 11/05/2021 10:34 AM   GFRNONAA 67 10/05/2019 11:47 AM   GFRAA 78 10/05/2019 11:47 AM       Latest Ref Rng & Units 03/11/2022    9:53 AM 11/05/2021  10:34 AM 04/30/2021   11:28 AM  BMP  Glucose 70 - 99 mg/dL 106  108  114   BUN 8 - 27 mg/dL 11  10  13   $ Creatinine 0.76 - 1.27 mg/dL 1.13  1.12  1.30   BUN/Creat Ratio 10 - 24 10  9  10   $ Sodium 134 - 144 mmol/L 139  140  145   Potassium 3.5 - 5.2 mmol/L 4.7  4.7  5.0   Chloride 96 - 106 mmol/L 101  101  103   CO2 20 - 29 mmol/L 23  24  23   $ Calcium 8.6 - 10.2 mg/dL 9.3  9.5  10.0      [place] Pharmacist Assistant [phone]

## 2022-03-25 ENCOUNTER — Other Ambulatory Visit: Payer: Self-pay | Admitting: Family Medicine

## 2022-03-26 ENCOUNTER — Other Ambulatory Visit: Payer: Self-pay

## 2022-03-26 MED ORDER — AMLODIPINE BESYLATE 10 MG PO TABS: 10.0000 mg | ORAL_TABLET | Freq: Every day | ORAL | 0 refills | Status: DC

## 2022-03-28 ENCOUNTER — Other Ambulatory Visit: Payer: Self-pay | Admitting: Family Medicine

## 2022-03-28 DIAGNOSIS — E782 Mixed hyperlipidemia: Secondary | ICD-10-CM

## 2022-04-22 ENCOUNTER — Telehealth: Payer: Self-pay

## 2022-04-22 NOTE — Progress Notes (Signed)
Care Management & Coordination Services Pharmacy Team  Reason for Encounter: Hypertension  Contacted patient to discuss hypertension disease state. Spoke with patient on 04/22/2022     Current antihypertensive regimen:  Amlodipine 10 mg daily   Patient verbally confirms he is taking the above medications as directed. Yes  How often are you checking your Blood Pressure? 3-5x per week  he checks his blood pressure in the morning after taking his medication.  Current home BP readings: 03-11 117/83, 03-10 133/83, 03-08 115/76, 03-07 123/79, 03-06 122/79 Wrist or arm cuff:arm Caffeine intake:3 cups of coffee daily and down to 1 can of soda today   Salt intake:Limited    OTC medications including pseudoephedrine or NSAIDs?no  Any readings above 180/100? No  What recent interventions/DTPs have been made by any provider to improve Blood Pressure control since last CPP Visit: Patient stated he is now walking about 5 miles daily instead of 2 miles.  Any recent hospitalizations or ED visits since last visit with CPP? No  What diet changes have been made to improve Blood Pressure Control?  Patient stated he doesn't add salt to food and drinks plenty of water.    What exercise is being done to improve your Blood Pressure Control?  Patient stated he walks 4.5- miles daily with his dog   Adherence Review: Is the patient currently on ACE/ARB medication? No Does the patient have >5 day gap between last estimated fill dates? No  Star Rating Drugs:  Rosuvastatin 20 mg- Last filled 04-02-2022 100 DS. Previous 01-22-2022 100 DS    Chart Updates: Recent office visits:  None  Recent consult visits:  None  Hospital visits:  None in previous 6 months  Medications: Outpatient Encounter Medications as of 04/22/2022  Medication Sig   albuterol (VENTOLIN HFA) 108 (90 Base) MCG/ACT inhaler USE 2 INHALATIONS BY MOUTH EVERY 6 HOURS AS NEEDED FOR WHEEZING  OR SHORTNESS OF BREATH   amLODipine  (NORVASC) 10 MG tablet Take 1 tablet (10 mg total) by mouth daily.   Ascorbic Acid (VITAMIN C) 1000 MG tablet Take 1,000 mg by mouth daily.   aspirin 81 MG EC tablet Take 162 mg by mouth daily at 10 pm.   cholecalciferol (VITAMIN D3) 25 MCG (1000 UNIT) tablet Take 1,000 Units by mouth daily.   docusate sodium (COLACE) 100 MG capsule Take 100 mg by mouth daily.   Multiple Vitamins-Minerals (CENTRUM SILVER 50+MEN) TABS Take by mouth.   pantoprazole (PROTONIX) 40 MG tablet Take 1 tablet (40 mg total) by mouth daily.   rosuvastatin (CRESTOR) 20 MG tablet TAKE 1 TABLET BY MOUTH DAILY   traMADol (ULTRAM) 50 MG tablet TAKE 2 TABLETS BY MOUTH TWICE A DAY (Patient not taking: Reported on 03/11/2022)   No facility-administered encounter medications on file as of 04/22/2022.    Recent Office Vitals: BP Readings from Last 3 Encounters:  03/11/22 128/68  11/05/21 116/82  04/30/21 130/80   Pulse Readings from Last 3 Encounters:  03/11/22 76  11/05/21 87  04/30/21 88    Wt Readings from Last 3 Encounters:  03/11/22 186 lb (84.4 kg)  11/05/21 188 lb (85.3 kg)  04/30/21 180 lb (81.6 kg)     Kidney Function Lab Results  Component Value Date/Time   CREATININE 1.13 03/11/2022 09:53 AM   CREATININE 1.12 11/05/2021 10:34 AM   GFRNONAA 67 10/05/2019 11:47 AM   GFRAA 78 10/05/2019 11:47 AM       Latest Ref Rng & Units 03/11/2022  9:53 AM 11/05/2021   10:34 AM 04/30/2021   11:28 AM  BMP  Glucose 70 - 99 mg/dL 106  108  114   BUN 8 - 27 mg/dL '11  10  13   '$ Creatinine 0.76 - 1.27 mg/dL 1.13  1.12  1.30   BUN/Creat Ratio 10 - '24 10  9  10   '$ Sodium 134 - 144 mmol/L 139  140  145   Potassium 3.5 - 5.2 mmol/L 4.7  4.7  5.0   Chloride 96 - 106 mmol/L 101  101  103   CO2 20 - 29 mmol/L '23  24  23   '$ Calcium 8.6 - 10.2 mg/dL 9.3  9.5  10.0      [place] Pharmacist Assistant [phone]

## 2022-04-27 ENCOUNTER — Other Ambulatory Visit: Payer: Self-pay | Admitting: Family Medicine

## 2022-04-27 DIAGNOSIS — G8929 Other chronic pain: Secondary | ICD-10-CM

## 2022-05-21 ENCOUNTER — Telehealth: Payer: Self-pay

## 2022-05-21 ENCOUNTER — Other Ambulatory Visit: Payer: Self-pay | Admitting: Family Medicine

## 2022-05-21 NOTE — Progress Notes (Signed)
Care Management & Coordination Services Pharmacy Team  Reason for Encounter: COPD  Contacted patient to discuss COPD disease state. Spoke with patient on 05/21/2022   Current COPD regimen: Albuterol inhaler PRN     No data to display         Any recent hospitalizations or ED visits since last visit with CPP? No Reports COPD symptoms, including Shortness of breath at rest Patient stated he was having shortness of breath but now has an adjustable bed to sit up some while sleeping What recent interventions/DTPs have been made by any provider to improve breathing since last visit: None Have you had exacerbation/flare-up since last visit? No What do you do when you are short of breath?  Patient stated he doesn't lose breath during the day only sometimes if lying down.  Respiratory Devices/Equipment Do you have a nebulizer? No Do you use a Peak Flow Meter? No Do you use a maintenance inhaler? No How often do you forget to use your daily inhaler? Patient doesn't have a daily inhaler Do you use a rescue inhaler? Yes How often do you use your rescue inhaler?  infrequently Do you use a spacer with your inhaler? No  Adherence Review: Does the patient have >5 day gap between last estimated fill date for maintenance inhaler medications? No   Chart Review: Recent office visits:  None  Recent consult visits:  None  Hospital visits:  None in previous 6 months  Medications: Outpatient Encounter Medications as of 05/21/2022  Medication Sig   albuterol (VENTOLIN HFA) 108 (90 Base) MCG/ACT inhaler USE 2 INHALATIONS BY MOUTH EVERY 6 HOURS AS NEEDED FOR WHEEZING  OR SHORTNESS OF BREATH   amLODipine (NORVASC) 10 MG tablet TAKE 1 TABLET BY MOUTH DAILY   Ascorbic Acid (VITAMIN C) 1000 MG tablet Take 1,000 mg by mouth daily.   aspirin 81 MG EC tablet Take 162 mg by mouth daily at 10 pm.   cholecalciferol (VITAMIN D3) 25 MCG (1000 UNIT) tablet Take 1,000 Units by mouth daily.   docusate sodium  (COLACE) 100 MG capsule Take 100 mg by mouth daily.   Multiple Vitamins-Minerals (CENTRUM SILVER 50+MEN) TABS Take by mouth.   pantoprazole (PROTONIX) 40 MG tablet Take 1 tablet (40 mg total) by mouth daily.   rosuvastatin (CRESTOR) 20 MG tablet TAKE 1 TABLET BY MOUTH DAILY   traMADol (ULTRAM) 50 MG tablet TAKE 2 TABLETS BY MOUTH TWICE A DAY   No facility-administered encounter medications on file as of 05/21/2022.     Huey Romans [facility] Clinical Pharmacist Assistant [phone]

## 2022-06-10 DIAGNOSIS — H6123 Impacted cerumen, bilateral: Secondary | ICD-10-CM | POA: Diagnosis not present

## 2022-06-10 DIAGNOSIS — H60543 Acute eczematoid otitis externa, bilateral: Secondary | ICD-10-CM | POA: Diagnosis not present

## 2022-06-13 ENCOUNTER — Telehealth: Payer: Self-pay

## 2022-06-13 NOTE — Progress Notes (Signed)
Care Management & Coordination Services Pharmacy Team  Reason for Encounter: Appointment Reminder  Contacted patient to confirm telephone appointment with Artelia Laroche, PharmD on 06/17/22 at 3:00 pm.  Spoke with patient on 06/13/2022   Do you have any problems getting your medications? No  What is your top health concern you would like to discuss at your upcoming visit? No top concerns   Have you seen any other providers since your last visit with PCP? Yes, he was seen at the ENT    Chart review:  Recent office visits:  None  Recent consult visits:  06/10/22 Belva Agee DO (ENT). Seen for Ceruminosis.  No med changes.   06/03/22 McDonald, Joice Lofts NP (Behavioral) Seen for panic attack. Ordered Alprazolam 1mg  BID.   Hospital visits:  None   Star Rating Drugs:  Medication:  Last Fill: Day Supply Rosuvastatin   04/02/22-01/22/22 100ds  Care Gaps: Annual wellness visit in last year? Yes Colonoscopy: Never done  If Diabetic:N/A Last eye exam / retinopathy screening: Last diabetic foot exam:   Roxana Hires, [facility] Clinical Pharmacist Assistant  [phone]

## 2022-06-17 ENCOUNTER — Ambulatory Visit: Payer: Medicare Other

## 2022-06-17 NOTE — Patient Outreach (Signed)
Care Management & Coordination Services Pharmacy Note  06/17/2022 Name:  Gregory Delgado MRN:  [id number] DOB:  February 16, 1950  Summary: -Dog's Name: Lennon Alstrom (Named after Ilda Basset) -Cat's Name: Pinky -Patient walking 4-5 miles/day  Recommendations/Changes made from today's visit: -Patient on ASA take 2QD. I am unable to find a specific reason why in the notes. Recommend switching to QD and will co-sign PCP to check   Subjective: Gregory Delgado is an 72 y.o. year old male who is a primary patient of Cox, Kirsten, MD.  The care coordination team was consulted for assistance with disease management and care coordination needs.    Engaged with patient by telephone for follow up visit.  Recent office visits:  None   Recent consult visits:  06/10/22 Belva Agee DO (ENT). Seen for Ceruminosis.  No med changes.    06/03/22 McDonald, Joice Lofts NP (Behavioral) Seen for panic attack. Ordered Alprazolam 1mg  BID.    Hospital visits:  None   Objective:  Lab Results  Component Value Date   CREATININE 1.13 03/11/2022   BUN 11 03/11/2022   EGFR 70 03/11/2022   GFRNONAA 67 10/05/2019   GFRAA 78 10/05/2019   NA 139 03/11/2022   K 4.7 03/11/2022   CALCIUM 9.3 03/11/2022   CO2 23 03/11/2022   GLUCOSE 106 (H) 03/11/2022    Lab Results  Component Value Date/Time   HGBA1C 6.0 (H) 03/11/2022 09:53 AM   HGBA1C 6.0 (H) 11/05/2021 10:34 AM    Last diabetic Eye exam: No results found for: "HMDIABEYEEXA"  Last diabetic Foot exam: No results found for: "HMDIABFOOTEX"   Lab Results  Component Value Date   CHOL 127 03/11/2022   HDL 35 (L) 03/11/2022   LDLCALC 66 03/11/2022   TRIG 151 (H) 03/11/2022   CHOLHDL 3.6 03/11/2022       Latest Ref Rng & Units 03/11/2022    9:53 AM 11/05/2021   10:34 AM 04/30/2021   11:28 AM  Hepatic Function  Total Protein 6.0 - 8.5 g/dL 7.0  6.7  7.3   Albumin 3.9 - 4.9 g/dL 4.6  4.5  4.7   AST 0 - 40 IU/L 22  17  19    ALT 0 - 44 IU/L 16  14  16    Alk  Phosphatase 44 - 121 IU/L 113  113  129   Total Bilirubin 0.0 - 1.2 mg/dL 0.4  0.4  0.5     Lab Results  Component Value Date/Time   TSH 1.570 11/05/2021 10:34 AM   TSH 3.730 04/05/2019 10:34 AM       Latest Ref Rng & Units 03/11/2022    9:53 AM 11/05/2021   10:34 AM 04/30/2021   11:28 AM  CBC  WBC 3.4 - 10.8 x10E3/uL 9.3  10.3  12.2   Hemoglobin 13.0 - 17.7 g/dL 62.1  30.8  65.7   Hematocrit 37.5 - 51.0 % 44.1  38.0  41.5   Platelets 150 - 450 x10E3/uL 268  255  274     No results found for: "VD25OH", "VITAMINB12"  Clinical ASCVD: Yes  The ASCVD Risk score (Arnett DK, et al., 2019) failed to calculate for the following reasons:   The valid total cholesterol range is 130 to 320 mg/dL    Other: ([id number] if Afib, MMRC or CAT for COPD, ACT, DEXA)     03/11/2022    8:59 AM 11/05/2021    9:49 AM 04/30/2021   10:51 AM  Depression screen PHQ 2/9  Decreased Interest 0  0 0  Down, Depressed, Hopeless 0 0 0  PHQ - 2 Score 0 0 0  Altered sleeping 0 0   Tired, decreased energy 1 0   Change in appetite 0 0   Feeling bad or failure about yourself  0 0   Trouble concentrating 0 0   Moving slowly or fidgety/restless 0 0   Suicidal thoughts 0 0   PHQ-9 Score 1 0   Difficult doing work/chores Not difficult at all Not difficult at all      Social History   Tobacco Use  Smoking Status Every Day   Packs/day: 0.50   Years: 55.00   Additional pack years: 0.00   Total pack years: 27.50   Types: Cigarettes  Smokeless Tobacco Never   BP Readings from Last 3 Encounters:  03/11/22 128/68  11/05/21 116/82  04/30/21 130/80   Pulse Readings from Last 3 Encounters:  03/11/22 76  11/05/21 87  04/30/21 88   Wt Readings from Last 3 Encounters:  03/11/22 186 lb (84.4 kg)  11/05/21 188 lb (85.3 kg)  04/30/21 180 lb (81.6 kg)   BMI Readings from Last 3 Encounters:  03/11/22 28.28 kg/m  11/05/21 28.59 kg/m  04/30/21 27.37 kg/m    Allergies  Allergen Reactions   Augmentin  [Amoxicillin-Pot Clavulanate] Itching and Nausea And Vomiting    Medications Reviewed Today     Reviewed by Zettie Pho, [facility] (Pharmacist) on 06/17/22 at 1505  Med List Status: <None>   Medication Order Taking? Sig Documenting Provider Last Dose Status Informant  albuterol (VENTOLIN HFA) 108 (90 Base) MCG/ACT inhaler [id number]  USE 2 INHALATIONS BY MOUTH EVERY 6 HOURS AS NEEDED FOR WHEEZING  OR SHORTNESS OF Jeanella Anton, Kirsten, MD  Active   amLODipine (NORVASC) 10 MG tablet [id number] Yes TAKE 1 TABLET BY MOUTH DAILY Cox, Kirsten, MD Taking Active   Ascorbic Acid (VITAMIN C) 1000 MG tablet [id number]  Take 1,000 mg by mouth daily. [provider]  Active   aspirin 81 MG EC tablet [id number]  Take 162 mg by mouth daily at 10 pm. [provider]  Active   cholecalciferol (VITAMIN D3) 25 MCG (1000 UNIT) tablet [id number]  Take 1,000 Units by mouth daily. [provider]  Active   docusate sodium (COLACE) 100 MG capsule [id number]  Take 100 mg by mouth daily. [provider]  Active   Multiple Vitamins-Minerals (CENTRUM SILVER 50+MEN) TABS [id number]  Take by mouth. [provider]  Active   pantoprazole (PROTONIX) 40 MG tablet [id number] Yes Take 1 tablet (40 mg total) by mouth daily. Cox, Kirsten, MD Taking Active   rosuvastatin (CRESTOR) 20 MG tablet [id number] Yes TAKE 1 TABLET BY MOUTH DAILY Cox, Kirsten, MD Taking Active   traMADol (ULTRAM) 50 MG tablet [id number]  TAKE 2 TABLETS BY MOUTH TWICE A DAY Cox, Kirsten, MD  Active             SDOH:  (Social Determinants of Health) assessments and interventions performed: Yes SDOH Interventions    Flowsheet Row Care Coordination from 06/17/2022 in [facility] Office Visit from 03/11/2022 in [facility] Chronic Care Management from 12/17/2021 in [facility] Office Visit from 11/05/2021 in [facility] Chronic Care Management from 03/19/2021  in [facility] Office Visit from 01/24/2021 in [facility]  SDOH Interventions        Food Insecurity Interventions -- -- -- Intervention Not  Indicated -- --  Housing Interventions -- -- -- Intervention Not Indicated -- Intervention Not Indicated  Transportation Interventions Intervention Not Indicated -- Intervention Not Indicated Intervention Not Indicated Intervention Not Indicated Intervention Not Indicated  Utilities Interventions -- -- -- Intervention Not Indicated -- --  Alcohol Usage Interventions -- -- -- Intervention Not Indicated (Score <7) -- --  Depression Interventions/Treatment  -- PHQ2-9 Score <4 Follow-up Not Indicated -- -- -- --  Financial Strain Interventions Intervention Not Indicated -- -- Intervention Not Indicated Intervention Not Indicated --  Physical Activity Interventions -- -- -- Intervention Not Indicated -- --  Stress Interventions -- -- -- Intervention Not Indicated -- --  Social Connections Interventions -- -- -- Intervention Not Indicated -- Intervention Not Indicated       Medication Assistance: None required.  Patient affirms current coverage meets needs.   Name and location of Current pharmacy:  [facility] - [place], [place] - [address] [facility] [address] [place] [address] Phone: [phone] Fax: [phone]  [id number] [place], [place] - [address] AT [facility] [address] [place] [address] Phone: [phone] Fax: [phone]  [facility] - [place], [place] - [address] [place] [address] Phone: [phone] Fax: [phone]  Compliance/Adherence/Medication fill history: [facility] Drugs:  Medication:                Last Fill:         Day Supply Rosuvastatin               04/02/22-01/22/22 100ds   Care Gaps: Annual wellness visit in last year?  Yes Colonoscopy: Never done   Assessment/Plan  Hypertension (BP goal <140/90) -Not controlled -Current treatment: Amlodipine 10mg  Appropriate, Effective, Safe, Accessible -Medications previously tried: amlodipine, lisinopril  -Current home readings:  Feb 2023:  Doesn't write but normal 125-130/80-82 per patient November 2023:  Doesn't write down but is in 150's systolic per patient December 0160: 12-28 123/88, 12-31 134/81, 02-11-99 124/84, 01-08 121/74, 01-10 138/84  Feb 2024: 02-11 127/81, 02-10 112/74, 02-02 127/83, 02-03 140/84  March 2024: 03-11 117/83, 03-10 133/83, 03-08 115/76, 03-07 123/79, 03-06 122/79  May 2024: 06/17/22: 129/79 06/16/22: 132/83 06/14/22: 122/72 06/13/22: 118/78 06/12/22: 126/79 -Current dietary habits: eats a lot of vegetables. Minimal meat.  -Current exercise habits: rides exercise bike at home.  -Denies hypotensive/hypertensive symptoms -Educated on BP goals and benefits of medications for prevention of heart attack, stroke and kidney damage; Daily salt intake goal < 2300 mg; Exercise goal of 150 minutes per week; Importance of home blood pressure monitoring; -Counseled to monitor BP at home weekly, document, and provide log at future appointments -Counseled on diet and exercise extensively November 2023: BP elevated, will write down for next week and CCM team will reach out May 2024: BP doing great!   Hyperlipidemia: (LDL goal < 100) The ASCVD Risk score (Arnett DK, et al., 2019) failed to calculate for the following reasons:   The valid total cholesterol range is 130 to 320 mg/dL Lab Results  Component Value Date   CHOL 127 03/11/2022   CHOL 120 11/05/2021   CHOL 132 04/30/2021   Lab Results  Component Value Date   HDL 35 (L) 03/11/2022   HDL 37 (L) 11/05/2021   HDL 34 (L) 04/30/2021   Lab Results  Component Value Date   LDLCALC 66 03/11/2022  LDLCALC 55 11/05/2021   LDLCALC 67 04/30/2021   Lab Results  Component Value Date   TRIG  151 (H) 03/11/2022   TRIG 164 (H) 11/05/2021   TRIG 186 (H) 04/30/2021   Lab Results  Component Value Date   CHOLHDL 3.6 03/11/2022   CHOLHDL 3.2 11/05/2021   CHOLHDL 3.9 04/30/2021   No results found for: "LDLDIRECT" Last vitamin D No results found for: "25OHVITD2", "25OHVITD3", "VD25OH" Lab Results  Component Value Date   TSH 1.570 11/05/2021   -controlled -Current treatment: rosuvastatin 20 mg daily Appropriate, Effective, Safe, Accessible Aspirin 81 mg EC 2 tablets daily Query Appropriate,  -Medications previously tried: none reported  -Current dietary patterns: eats mostly vegetables. Reports small bag of popcorn at night for supper.   -Current exercise habits:  bought an exercise bike from his neighbor recently and working to improve stamins. Using bike several times a day and walking dog outdoors.  -Educated on Cholesterol goals;  Benefits of statin for ASCVD risk reduction; Importance of limiting foods high in cholesterol; Exercise goal of 150 minutes per week; -Counseled on diet and exercise extensively May 2024: Will ask PCP as to why patient taking ASA 2QD     Depression/Anxiety (Goal: manage anxiety) -controlled -Current treatment: Alprazolam 1 mg bid - Appropriate, Effective, Safe, Accessible -Medications previously tried/failed: clonazepam -PHQ9:     03/11/2022    8:59 AM 11/05/2021    9:49 AM 04/30/2021   10:51 AM  Depression screen PHQ 2/9  Decreased Interest 0 0 0  Down, Depressed, Hopeless 0 0 0  PHQ - 2 Score 0 0 0  Altered sleeping 0 0   Tired, decreased energy 1 0   Change in appetite 0 0   Feeling bad or failure about yourself  0 0   Trouble concentrating 0 0   Moving slowly or fidgety/restless 0 0   Suicidal thoughts 0 0   PHQ-9 Score 1 0   Difficult doing work/chores Not difficult at all Not difficult at all    -GAD7:     03/27/2020    2:23 PM  GAD 7 : Generalized Anxiety Score  Nervous, Anxious, on Edge 1  Control/stop worrying 1   Worry too much - different things 1  Trouble relaxing 1  Restless 1  Easily annoyed or irritable 1  Afraid - awful might happen 1  Total GAD 7 Score 7  Anxiety Difficulty Not difficult at all   -Educated on Benefits of medication for symptom control Benefits of cognitive-behavioral therapy with or without medication -Recommended to continue current medication    CP F/u PRN  Artelia Laroche, Pharm.D. - [phone]

## 2022-08-03 ENCOUNTER — Other Ambulatory Visit: Payer: Self-pay | Admitting: Family Medicine

## 2022-08-28 ENCOUNTER — Other Ambulatory Visit: Payer: Self-pay | Admitting: Family Medicine

## 2022-08-28 DIAGNOSIS — G8929 Other chronic pain: Secondary | ICD-10-CM

## 2022-09-04 ENCOUNTER — Other Ambulatory Visit: Payer: Self-pay | Admitting: Family Medicine

## 2022-09-04 DIAGNOSIS — E782 Mixed hyperlipidemia: Secondary | ICD-10-CM

## 2022-09-08 NOTE — Assessment & Plan Note (Signed)
The current medical regimen is effective;  continue present plan and medications.  

## 2022-09-08 NOTE — Assessment & Plan Note (Signed)
Taking amlodipine 10 mg daily.   Check labs.

## 2022-09-08 NOTE — Assessment & Plan Note (Signed)
Recommend continue to work on eating healthy diet and exercise. Labs drawn today.  

## 2022-09-08 NOTE — Progress Notes (Signed)
Subjective:  Patient ID: Gregory Delgado, male    DOB: 09/21/1950  Age: 72 y.o. MRN: [id number]  Chief Complaint  Patient presents with   Medical Management of Chronic Issues    HPI Hyperlipidemia:  Patient is currently taking Rosuvastatin 20 mg take 1 tablet daily.   Hypertension: Patient is taking amlodipine 10 mg daily.     GERD: Patient is currently taking Pantoprazole 40 mg take 1 tablet daily.   Left shoulder and  lumbar back pain. Taking tramadol 50 mg 2 oral twice daily.   Prediabetes: 6.0.  Patient is exercising more.  Eats a healthy diet.  Has AWV 11/10/2022     09/09/2022    9:12 AM 09/09/2022    9:07 AM 03/11/2022    8:59 AM 11/05/2021    9:49 AM 04/30/2021   10:51 AM  Depression screen PHQ 2/9  Decreased Interest 1 1 0 0 0  Down, Depressed, Hopeless 0 0 0 0 0  PHQ - 2 Score 1 1 0 0 0  Altered sleeping 0 0 0 0   Tired, decreased energy 1 1 1  0   Change in appetite 0 0 0 0   Feeling bad or failure about yourself  0 0 0 0   Trouble concentrating 0 0 0 0   Moving slowly or fidgety/restless 0 0 0 0   Suicidal thoughts 0 0 0 0   PHQ-9 Score 2 2 1  0   Difficult doing work/chores Not difficult at all Not difficult at all Not difficult at all Not difficult at all         09/09/2022    9:07 AM  Fall Risk   Falls in the past year? 0  Number falls in past yr: 0  Injury with Fall? 0  Risk for fall due to : No Fall Risks  Follow up Falls evaluation completed;Falls prevention discussed    Patient Care Team: Blane Ohara, MD as PCP - General (Family Medicine) Maudie Flakes, MD as Referring Physician (Otolaryngology) Zettie Pho, [facility] (Inactive) (Pharmacist)   Review of Systems  Constitutional:  Negative for chills, diaphoresis, fatigue and fever.  HENT:  Negative for congestion, ear pain (right ear stopped up) and sore throat.   Respiratory:  Negative for cough and shortness of breath.   Cardiovascular:  Negative for chest pain and leg swelling.   Gastrointestinal:  Negative for abdominal pain, constipation, diarrhea, nausea and vomiting.  Genitourinary:  Negative for dysuria and urgency.  Musculoskeletal:  Positive for back pain. Negative for arthralgias and myalgias.  Neurological:  Negative for dizziness and headaches.  Psychiatric/Behavioral:  Negative for dysphoric mood. The patient is not nervous/anxious.     Current Outpatient Medications on File Prior to Visit  Medication Sig Dispense Refill   albuterol (VENTOLIN HFA) 108 (90 Base) MCG/ACT inhaler USE 2 INHALATIONS BY MOUTH EVERY 6 HOURS AS NEEDED FOR WHEEZING  OR SHORTNESS OF BREATH 3 each 3   ALPRAZolam (XANAX) 1 MG tablet Take one tablet bid     amLODipine (NORVASC) 10 MG tablet TAKE 1 TABLET BY MOUTH DAILY 100 tablet 0   Ascorbic Acid (VITAMIN C) 1000 MG tablet Take 1,000 mg by mouth daily.     aspirin 81 MG EC tablet Take 162 mg by mouth daily at 10 pm.     docusate sodium (COLACE) 100 MG capsule Take 100 mg by mouth daily.     Multiple Vitamins-Minerals (CENTRUM SILVER 50+MEN) TABS Take by mouth.     pantoprazole (  PROTONIX) 40 MG tablet Take 1 tablet (40 mg total) by mouth daily. 90 tablet 3   rosuvastatin (CRESTOR) 20 MG tablet TAKE 1 TABLET BY MOUTH DAILY 100 tablet 0   traMADol (ULTRAM) 50 MG tablet TAKE 2 TABLETS BY MOUTH TWICE A DAY 120 tablet 3   No current facility-administered medications on file prior to visit.   Past Medical History:  Diagnosis Date   COPD (chronic obstructive pulmonary disease) (HCC)    GERD (gastroesophageal reflux disease)    Hyperlipidemia    Hypertension    Osteoarthritis    Past Surgical History:  Procedure Laterality Date   CERVICAL DISC SURGERY  2015   C2-C4    KNEE SURGERY  1976    Family History  Problem Relation Age of Onset   Diabetes Mother    Stroke Mother    Alcoholism Father    CVA Sister    Stroke Sister    Congenital heart disease Daughter    Rectal cancer Son    Cirrhosis Son    Cancer - Colon Son     Social History   Socioeconomic History   Marital status: Single    Spouse name: Not on file   Number of children: Not on file   Years of education: Not on file   Highest education level: Not on file  Occupational History   Occupation: Retired Naval architect  Tobacco Use   Smoking status: Every Day    Types: Cigarettes   Smokeless tobacco: Never  Vaping Use   Vaping status: Never Used  Substance and Sexual Activity   Alcohol use: Never   Drug use: Never   Sexual activity: Not on file  Other Topics Concern   Not on file  Social History Narrative   Not on file   Social Determinants of Health   Financial Resource Strain: Low Risk  (09/09/2022)   Overall Financial Resource Strain (CARDIA)    Difficulty of Paying Living Expenses: Not hard at all  Food Insecurity: No Food Insecurity (09/09/2022)   Hunger Vital Sign    Worried About Running Out of Food in the Last Year: Never true    Ran Out of Food in the Last Year: Never true  Transportation Needs: No Transportation Needs (09/09/2022)   PRAPARE - Administrator, Civil Service (Medical): No    Lack of Transportation (Non-Medical): No  Physical Activity: Sufficiently Active (09/09/2022)   Exercise Vital Sign    Days of Exercise per Week: 7 days    Minutes of Exercise per Session: 60 min  Stress: No Stress Concern Present (09/09/2022)   [employer] of Occupational Health - Occupational Stress Questionnaire    Feeling of Stress : Only a little  Social Connections: Socially Isolated (09/09/2022)   Social Connection and Isolation Panel [NHANES]    Frequency of Communication with Friends and Family: Twice a week    Frequency of Social Gatherings with Friends and Family: Twice a week    Attends Religious Services: Never    Diplomatic Services operational officer: No    Attends Engineer, structural: Never    Marital Status: Divorced    Objective:  BP 116/78   Pulse 80   Temp (!) 97 F (36.1 C)   Resp  16   Ht 5\' 8"  (1.727 m)   Wt 193 lb 9.6 oz (87.8 kg)   BMI 29.44 kg/m      09/09/2022    9:05 AM 03/11/2022  9:20 AM 11/05/2021    9:53 AM  BP/Weight  Systolic BP 116 128 116  Diastolic BP 78 68 82  Wt. (Lbs) 193.6 186 188  BMI 29.44 kg/m2 28.28 kg/m2 28.59 kg/m2    Physical Exam Vitals reviewed.  Constitutional:      Appearance: Normal appearance. He is normal weight.  Cardiovascular:     Rate and Rhythm: Normal rate and regular rhythm.     Heart sounds: No murmur heard. Pulmonary:     Effort: Pulmonary effort is normal.     Breath sounds: Normal breath sounds.  Abdominal:     General: Abdomen is flat. Bowel sounds are normal.     Palpations: Abdomen is soft.     Tenderness: There is no abdominal tenderness.  Musculoskeletal:     Comments: Low back pain w/radiculopathy right leg.    Neurological:     Mental Status: He is alert and oriented to person, place, and time.  Psychiatric:        Mood and Affect: Mood normal.        Behavior: Behavior normal.     Diabetic Foot Exam - Simple   No data filed      Lab Results  Component Value Date   WBC 11.3 (H) 09/09/2022   HGB 13.8 09/09/2022   HCT 38.3 09/09/2022   PLT 273 09/09/2022   GLUCOSE 100 (H) 09/09/2022   CHOL 121 09/09/2022   TRIG 121 09/09/2022   HDL 39 (L) 09/09/2022   LDLCALC 60 09/09/2022   ALT 13 09/09/2022   AST 19 09/09/2022   NA 142 09/09/2022   K 4.5 09/09/2022   CL 102 09/09/2022   CREATININE 1.26 09/09/2022   BUN 10 09/09/2022   CO2 25 09/09/2022   TSH 1.570 11/05/2021   HGBA1C 6.0 (H) 09/09/2022      Assessment & Plan:    Mixed hyperlipidemia Assessment & Plan: Well controlled.  No changes to medicines. Continue rosuvastatin 20 mg before bed.  Continue to work on eating a healthy diet and exercise.  Labs drawn today.    Orders: -     Lipid panel  Essential hypertension, benign Assessment & Plan: Taking amlodipine 10 mg daily.   Check labs.   Orders: -     CBC with  Differential/Platelet -     Comprehensive metabolic panel  Impaired fasting glucose Assessment & Plan: Recommend continue to work on eating healthy diet and exercise. Labs drawn today.  Orders: -     Hemoglobin A1c  Cigarette nicotine dependence with nicotine-induced disorder Assessment & Plan: Recommend cessation. Patient not ready to quit.    Lumbar back pain Assessment & Plan: Continue tramadol 50 mg 2 oral twice daily.    GERD without esophagitis Assessment & Plan: The current medical regimen is effective;  continue present plan and medications. Continue Pantoprazole 40 mg take 1 tablet daily.   Mild recurrent major depression (HCC) Assessment & Plan: The current medical regimen is effective;  continue present plan and medications.    Impacted cerumen of right ear Assessment & Plan: Attempted to irrigate right ear but unsuccessful in removing wax. Advised debrox daily and recheck for a nurse visit or follow-up with ENT for his quarterly visit.        No orders of the defined types were placed in this encounter.   Orders Placed This Encounter  Procedures   CBC with Differential/Platelet   Comprehensive metabolic panel   Lipid panel   Hemoglobin A1c  Follow-up: Return in about 6 months (around 03/12/2023) for chronic, fasting.   I,Katherina A Bramblett,acting as a scribe for Blane Ohara, MD.,have documented all relevant documentation on the behalf of Blane Ohara, MD,as directed by  Blane Ohara, MD while in the presence of Blane Ohara, MD.   An After Visit Summary was printed and given to the patient.  Blane Ohara, MD  Family Practice [phone]

## 2022-09-08 NOTE — Assessment & Plan Note (Signed)
The current medical regimen is effective;  continue present plan and medications. Continue Pantoprazole 40 mg take 1 tablet daily.

## 2022-09-08 NOTE — Assessment & Plan Note (Signed)
Continue tramadol 50 mg 2 oral twice daily.

## 2022-09-08 NOTE — Assessment & Plan Note (Signed)
Well controlled.  No changes to medicines. Continue  rosuvastatin 20 mg before bed.  Continue to work on eating a healthy diet and exercise.  Labs drawn today.   

## 2022-09-08 NOTE — Assessment & Plan Note (Signed)
Recommend cessation. Patient not ready to quit.  

## 2022-09-09 ENCOUNTER — Encounter: Payer: Self-pay | Admitting: Family Medicine

## 2022-09-09 ENCOUNTER — Ambulatory Visit: Payer: Medicare Other | Admitting: Family Medicine

## 2022-09-09 VITALS — BP 116/78 | HR 80 | Temp 97.0°F | Resp 16 | Ht 68.0 in | Wt 193.6 lb

## 2022-09-09 DIAGNOSIS — H6121 Impacted cerumen, right ear: Secondary | ICD-10-CM

## 2022-09-09 DIAGNOSIS — K219 Gastro-esophageal reflux disease without esophagitis: Secondary | ICD-10-CM

## 2022-09-09 DIAGNOSIS — F17219 Nicotine dependence, cigarettes, with unspecified nicotine-induced disorders: Secondary | ICD-10-CM

## 2022-09-09 DIAGNOSIS — M545 Low back pain, unspecified: Secondary | ICD-10-CM | POA: Diagnosis not present

## 2022-09-09 DIAGNOSIS — I10 Essential (primary) hypertension: Secondary | ICD-10-CM

## 2022-09-09 DIAGNOSIS — R7301 Impaired fasting glucose: Secondary | ICD-10-CM

## 2022-09-09 DIAGNOSIS — E782 Mixed hyperlipidemia: Secondary | ICD-10-CM

## 2022-09-09 DIAGNOSIS — F330 Major depressive disorder, recurrent, mild: Secondary | ICD-10-CM

## 2022-09-09 LAB — CBC WITH DIFFERENTIAL/PLATELET
Basophils Absolute: 0.1 10*3/uL (ref 0.0–0.2)
Basos: 1 %
EOS (ABSOLUTE): 0.2 10*3/uL (ref 0.0–0.4)
Eos: 2 %
Hematocrit: 38.3 % (ref 37.5–51.0)
Hemoglobin: 13.8 g/dL (ref 13.0–17.7)
Immature Grans (Abs): 0 10*3/uL (ref 0.0–0.1)
Immature Granulocytes: 0 %
Lymphocytes Absolute: 2.8 10*3/uL (ref 0.7–3.1)
Lymphs: 25 %
MCH: 33.5 pg — ABNORMAL HIGH (ref 26.6–33.0)
MCHC: 36 g/dL — ABNORMAL HIGH (ref 31.5–35.7)
MCV: 93 fL (ref 79–97)
Monocytes Absolute: 0.9 10*3/uL (ref 0.1–0.9)
Monocytes: 8 %
Neutrophils Absolute: 7.3 10*3/uL — ABNORMAL HIGH (ref 1.4–7.0)
Neutrophils: 64 %
Platelets: 273 10*3/uL (ref 150–450)
RBC: 4.12 x10E6/uL — ABNORMAL LOW (ref 4.14–5.80)
RDW: 13.5 % (ref 11.6–15.4)
WBC: 11.3 10*3/uL — ABNORMAL HIGH (ref 3.4–10.8)

## 2022-09-09 LAB — LIPID PANEL
Chol/HDL Ratio: 3.1 ratio (ref 0.0–5.0)
Cholesterol, Total: 121 mg/dL (ref 100–199)
HDL: 39 mg/dL — ABNORMAL LOW (ref >39)
LDL Chol Calc (NIH): 60 mg/dL (ref 0–99)
Triglycerides: 121 mg/dL (ref 0–149)
VLDL Cholesterol Cal: 22 mg/dL (ref 5–40)

## 2022-09-09 LAB — COMPREHENSIVE METABOLIC PANEL
ALT: 13 IU/L (ref 0–44)
AST: 19 IU/L (ref 0–40)
Albumin: 4.3 g/dL (ref 3.9–4.9)
Alkaline Phosphatase: 117 IU/L (ref 44–121)
BUN/Creatinine Ratio: 8 — ABNORMAL LOW (ref 10–24)
BUN: 10 mg/dL (ref 8–27)
Bilirubin Total: 0.2 mg/dL (ref 0.0–1.2)
CO2: 25 mmol/L (ref 20–29)
Calcium: 9.3 mg/dL (ref 8.6–10.2)
Chloride: 102 mmol/L (ref 96–106)
Creatinine, Ser: 1.26 mg/dL (ref 0.76–1.27)
Globulin, Total: 2.4 g/dL (ref 1.5–4.5)
Glucose: 100 mg/dL — ABNORMAL HIGH (ref 70–99)
Potassium: 4.5 mmol/L (ref 3.5–5.2)
Sodium: 142 mmol/L (ref 134–144)
Total Protein: 6.7 g/dL (ref 6.0–8.5)
eGFR: 62 mL/min/{1.73_m2} (ref >59)

## 2022-09-09 LAB — HEMOGLOBIN A1C
Est. average glucose Bld gHb Est-mCnc: 126 mg/dL
Hgb A1c MFr Bld: 6 % — ABNORMAL HIGH (ref 4.8–5.6)

## 2022-09-09 NOTE — Assessment & Plan Note (Signed)
Attempted to irrigate right ear but unsuccessful in removing wax. Advised debrox daily and recheck for a nurse visit or follow-up with ENT for his quarterly visit.

## 2022-09-09 NOTE — Patient Instructions (Signed)
Needs Tdap at local pharmacy.

## 2022-09-13 ENCOUNTER — Encounter: Payer: Self-pay | Admitting: Family Medicine

## 2022-10-07 DIAGNOSIS — H60543 Acute eczematoid otitis externa, bilateral: Secondary | ICD-10-CM | POA: Diagnosis not present

## 2022-10-07 DIAGNOSIS — H6121 Impacted cerumen, right ear: Secondary | ICD-10-CM | POA: Diagnosis not present

## 2022-10-08 ENCOUNTER — Other Ambulatory Visit: Payer: Self-pay | Admitting: Family Medicine

## 2022-10-12 ENCOUNTER — Other Ambulatory Visit: Payer: Self-pay | Admitting: Family Medicine

## 2022-11-10 ENCOUNTER — Ambulatory Visit (INDEPENDENT_AMBULATORY_CARE_PROVIDER_SITE_OTHER): Payer: Medicare Other

## 2022-11-10 VITALS — BP 120/74 | HR 78 | Resp 16 | Ht 68.0 in | Wt 190.0 lb

## 2022-11-10 DIAGNOSIS — Z0000 Encounter for general adult medical examination without abnormal findings: Secondary | ICD-10-CM

## 2022-11-10 NOTE — Patient Instructions (Signed)
Mr. Gregory Delgado , Thank you for taking time to come for your Medicare Wellness Visit. I appreciate your ongoing commitment to your health goals. Please review the following plan we discussed and let me know if I can assist you in the future.    This is a list of the screening recommended for you and due dates:  Health Maintenance  Topic Date Due   Flu Shot  05/11/2023*   Pneumonia Vaccine (1 of 2 - PCV) 09/09/2023*   Medicare Annual Wellness Visit  11/10/2023   Hepatitis C Screening  Completed   HPV Vaccine  Aged Out   DTaP/Tdap/Td vaccine  Discontinued   Colon Cancer Screening  Discontinued   COVID-19 Vaccine  Discontinued   Zoster (Shingles) Vaccine  Discontinued  *Topic was postponed. The date shown is not the original due date.    Preventive Care 50 Years and Older, Male  Preventive care refers to lifestyle choices and visits with your health care provider that can promote health and wellness. What does preventive care include? A yearly physical exam. This is also called an annual well check. Dental exams once or twice a year. Routine eye exams. Ask your health care provider how often you should have your eyes checked. Personal lifestyle choices, including: Daily care of your teeth and gums. Regular physical activity. Eating a healthy diet. Avoiding tobacco and drug use. Limiting alcohol use. Practicing safe sex. Taking low doses of aspirin every day. Taking vitamin and mineral supplements as recommended by your health care provider. What happens during an annual well check? The services and screenings done by your health care provider during your annual well check will depend on your age, overall health, lifestyle risk factors, and family history of disease. Counseling  Your health care provider may ask you questions about your: Alcohol use. Tobacco use. Drug use. Emotional well-being. Home and relationship well-being. Sexual activity. Eating habits. History of  falls. Memory and ability to understand (cognition). Work and work Astronomer. Screening  You may have the following tests or measurements: Height, weight, and BMI. Blood pressure. Lipid and cholesterol levels. These may be checked every 5 years, or more frequently if you are over 44 years old. Skin check. Lung cancer screening. You may have this screening every year starting at age 77 if you have a 30-pack-year history of smoking and currently smoke or have quit within the past 15 years. Fecal occult blood test (FOBT) of the stool. You may have this test every year starting at age 59. Flexible sigmoidoscopy or colonoscopy. You may have a sigmoidoscopy every 5 years or a colonoscopy every 10 years starting at age 55. Prostate cancer screening. Recommendations will vary depending on your family history and other risks. Hepatitis C blood test. Hepatitis B blood test. Sexually transmitted disease (STD) testing. Diabetes screening. This is done by checking your blood sugar (glucose) after you have not eaten for a while (fasting). You may have this done every 1-3 years. Abdominal aortic aneurysm (AAA) screening. You may need this if you are a current or former smoker. Osteoporosis. You may be screened starting at age 68 if you are at high risk. Talk with your health care provider about your test results, treatment options, and if necessary, the need for more tests. Vaccines  Your health care provider may recommend certain vaccines, such as: Influenza vaccine. This is recommended every year. Tetanus, diphtheria, and acellular pertussis (Tdap, Td) vaccine. You may need a Td booster every 10 years. Zoster vaccine. You may  need this after age 82. Pneumococcal 13-valent conjugate (PCV13) vaccine. One dose is recommended after age 5. Pneumococcal polysaccharide (PPSV23) vaccine. One dose is recommended after age 46. Talk to your health care provider about which screenings and vaccines you need and  how often you need them. This information is not intended to replace advice given to you by your health care provider. Make sure you discuss any questions you have with your health care provider. Document Released: 02/23/2015 Document Revised: 10/17/2015 Document Reviewed: 11/28/2014 Elsevier Interactive Patient Education  2017 [employer].  Fall Prevention in the Home Falls can cause injuries. They can happen to people of all ages. There are many things you can do to make your home safe and to help prevent falls. What can I do on the outside of my home? Regularly fix the edges of walkways and driveways and fix any cracks. Remove anything that might make you trip as you walk through a door, such as a raised step or threshold. Trim any bushes or trees on the path to your home. Use bright outdoor lighting. Clear any walking paths of anything that might make someone trip, such as rocks or tools. Regularly check to see if handrails are loose or broken. Make sure that both sides of any steps have handrails. Any raised decks and porches should have guardrails on the edges. Have any leaves, snow, or ice cleared regularly. Use sand or salt on walking paths during winter. Clean up any spills in your garage right away. This includes oil or grease spills. What can I do in the bathroom? Use night lights. Install grab bars by the toilet and in the tub and shower. Do not use towel bars as grab bars. Use non-skid mats or decals in the tub or shower. If you need to sit down in the shower, use a plastic, non-slip stool. Keep the floor dry. Clean up any water that spills on the floor as soon as it happens. Remove soap buildup in the tub or shower regularly. Attach bath mats securely with double-sided non-slip rug tape. Do not have throw rugs and other things on the floor that can make you trip. What can I do in the bedroom? Use night lights. Make sure that you have a light by your bed that is easy to  reach. Do not use any sheets or blankets that are too big for your bed. They should not hang down onto the floor. Have a firm chair that has side arms. You can use this for support while you get dressed. Do not have throw rugs and other things on the floor that can make you trip. What can I do in the kitchen? Clean up any spills right away. Avoid walking on wet floors. Keep items that you use a lot in easy-to-reach places. If you need to reach something above you, use a strong step stool that has a grab bar. Keep electrical cords out of the way. Do not use floor polish or wax that makes floors slippery. If you must use wax, use non-skid floor wax. Do not have throw rugs and other things on the floor that can make you trip. What can I do with my stairs? Do not leave any items on the stairs. Make sure that there are handrails on both sides of the stairs and use them. Fix handrails that are broken or loose. Make sure that handrails are as long as the stairways. Check any carpeting to make sure that it is firmly attached  to the stairs. Fix any carpet that is loose or worn. Avoid having throw rugs at the top or bottom of the stairs. If you do have throw rugs, attach them to the floor with carpet tape. Make sure that you have a light switch at the top of the stairs and the bottom of the stairs. If you do not have them, ask someone to add them for you. What else can I do to help prevent falls? Wear shoes that: Do not have high heels. Have rubber bottoms. Are comfortable and fit you well. Are closed at the toe. Do not wear sandals. If you use a stepladder: Make sure that it is fully opened. Do not climb a closed stepladder. Make sure that both sides of the stepladder are locked into place. Ask someone to hold it for you, if possible. Clearly mark and make sure that you can see: Any grab bars or handrails. First and last steps. Where the edge of each step is. Use tools that help you move  around (mobility aids) if they are needed. These include: Canes. Walkers. Scooters. Crutches. Turn on the lights when you go into a dark area. Replace any light bulbs as soon as they burn out. Set up your furniture so you have a clear path. Avoid moving your furniture around. If any of your floors are uneven, fix them. If there are any pets around you, be aware of where they are. Review your medicines with your doctor. Some medicines can make you feel dizzy. This can increase your chance of falling. Ask your doctor what other things that you can do to help prevent falls. This information is not intended to replace advice given to you by your health care provider. Make sure you discuss any questions you have with your health care provider. Document Released: 11/23/2008 Document Revised: 07/05/2015 Document Reviewed: 03/03/2014 Elsevier Interactive Patient Education  2017 [employer].

## 2022-11-10 NOTE — Progress Notes (Signed)
Subjective:   Gregory Delgado is a 72 y.o. male who presents for Medicare Annual/Subsequent preventive examination.  This wellness visit is conducted by a nurse.  The patient's medications were reviewed and reconciled since the patient's last visit.  History details were provided by the patient.  The history appears to be reliable.    Medical History: Patient history and Family history was reviewed  Medications, Allergies, and preventative health maintenance was reviewed and updated.   Visit Complete: In person  Cardiac Risk Factors include: advanced age (>66men, >31 women);male gender;smoking/ tobacco exposure     Objective:    Today's Vitals   11/10/22 1441  BP: 120/74  Pulse: 78  Resp: 16  Weight: 190 lb (86.2 kg)  Height: 5\' 8"  (1.727 m)  PainSc: 4   PainLoc: Shoulder   Body mass index is 28.89 kg/m.     11/07/2021    9:46 PM 10/24/2020    4:07 PM  Advanced Directives  Does Patient Have a Medical Advance Directive? No No  Would patient like information on creating a medical advance directive? Yes (MAU/Ambulatory/Procedural Areas - Information given) No - Patient declined    Current Medications (verified) Outpatient Encounter Medications as of 11/10/2022  Medication Sig   albuterol (VENTOLIN HFA) 108 (90 Base) MCG/ACT inhaler USE 2 INHALATIONS BY MOUTH EVERY 6 HOURS AS NEEDED FOR WHEEZING  OR SHORTNESS OF BREATH   ALPRAZolam (XANAX) 1 MG tablet Take one tablet bid   amLODipine (NORVASC) 10 MG tablet TAKE 1 TABLET BY MOUTH DAILY   Ascorbic Acid (VITAMIN C) 1000 MG tablet Take 1,000 mg by mouth daily.   aspirin 81 MG EC tablet Take 162 mg by mouth daily at 10 pm.   docusate sodium (COLACE) 100 MG capsule Take 100 mg by mouth daily.   Multiple Vitamins-Minerals (CENTRUM SILVER 50+MEN) TABS Take by mouth.   pantoprazole (PROTONIX) 40 MG tablet Take 1 tablet (40 mg total) by mouth daily.   rosuvastatin (CRESTOR) 20 MG tablet TAKE 1 TABLET BY MOUTH DAILY   traMADol  (ULTRAM) 50 MG tablet TAKE 2 TABLETS BY MOUTH TWICE A DAY   [DISCONTINUED] cholecalciferol (VITAMIN D3) 25 MCG (1000 UNIT) tablet Take 1,000 Units by mouth daily.   [DISCONTINUED] rosuvastatin (CRESTOR) 20 MG tablet TAKE 1 TABLET BY MOUTH  DAILY   [DISCONTINUED] traMADol (ULTRAM) 50 MG tablet TAKE 2 TABLETS BY MOUTH TWICE A DAY (Patient not taking: Reported on 03/11/2022)   No facility-administered encounter medications on file as of 11/10/2022.    Allergies (verified) Augmentin [amoxicillin-pot clavulanate]   History: Past Medical History:  Diagnosis Date   COPD (chronic obstructive pulmonary disease) (HCC)    GERD (gastroesophageal reflux disease)    Hyperlipidemia    Hypertension    Osteoarthritis    Past Surgical History:  Procedure Laterality Date   CERVICAL DISC SURGERY  2015   C2-C4    KNEE SURGERY  1976   Family History  Problem Relation Age of Onset   Diabetes Mother    Stroke Mother    Alcoholism Father    CVA Sister    Stroke Sister    Congenital heart disease Daughter    Rectal cancer Son    Cirrhosis Son    Cancer - Colon Son    Social History   Socioeconomic History   Marital status: Single    Spouse name: Not on file   Number of children: Not on file   Years of education: Not on file   Highest  education level: Not on file  Occupational History   Occupation: Retired Naval architect  Tobacco Use   Smoking status: Every Day    Types: Cigarettes   Smokeless tobacco: Never  Vaping Use   Vaping status: Never Used  Substance and Sexual Activity   Alcohol use: Never   Drug use: Never   Sexual activity: Not on file  Other Topics Concern   Not on file  Social History Narrative   Not on file   Social Determinants of Health   Financial Resource Strain: Low Risk  (09/09/2022)   Overall Financial Resource Strain (CARDIA)    Difficulty of Paying Living Expenses: Not hard at all  Food Insecurity: No Food Insecurity (09/09/2022)   Hunger Vital Sign     Worried About Running Out of Food in the Last Year: Never true    Ran Out of Food in the Last Year: Never true  Transportation Needs: No Transportation Needs (09/09/2022)   PRAPARE - Administrator, Civil Service (Medical): No    Lack of Transportation (Non-Medical): No  Physical Activity: Sufficiently Active (09/09/2022)   Exercise Vital Sign    Days of Exercise per Week: 7 days    Minutes of Exercise per Session: 60 min  Stress: No Stress Concern Present (09/09/2022)   [employer] of Occupational Health - Occupational Stress Questionnaire    Feeling of Stress : Only a little  Social Connections: Socially Isolated (09/09/2022)   Social Connection and Isolation Panel [NHANES]    Frequency of Communication with Friends and Family: Twice a week    Frequency of Social Gatherings with Friends and Family: Twice a week    Attends Religious Services: Never    Database administrator or Organizations: No    Attends Engineer, structural: Never    Marital Status: Divorced    Tobacco Counseling Ready to quit: No Counseling given: Not Answered   Clinical Intake:  Pre-visit preparation completed: Yes  Pain : 0-10 Pain Score: 4  Pain Type: Chronic pain Pain Location: Shoulder Pain Descriptors / Indicators: Aching Pain Frequency: Intermittent Pain Relieving Factors: Tramadol  Pain Relieving Factors: Tramadol  BMI - recorded: 28.89 Nutritional Status: BMI 25 -29 Overweight Nutritional Risks: None Diabetes: No  How often do you need to have someone help you when you read instructions, pamphlets, or other written materials from your doctor or pharmacy?: 1 - Never  Interpreter Needed?: No      Activities of Daily Living    11/10/2022    2:52 PM  In your present state of health, do you have any difficulty performing the following activities:  Hearing? 0  Vision? 0  Difficulty concentrating or making decisions? 0  Walking or climbing stairs? 0  Dressing  or bathing? 0  Doing errands, shopping? 0  Preparing Food and eating ? N  Using the Toilet? N  In the past six months, have you accidently leaked urine? N  Do you have problems with loss of bowel control? N  Managing your Medications? N  Managing your Finances? N  Housekeeping or managing your Housekeeping? N    Patient Care Team: Blane Ohara, MD as PCP - General (Family Medicine) Maudie Flakes, MD as Referring Physician (Otolaryngology)  Indicate any recent Medical Services you may have received from other than [facility] providers in the past year (date may be approximate).     Assessment:   This is a routine wellness examination for Cire.  Hearing/Vision screen No results  found.   Goals Addressed   None    Depression Screen    11/10/2022    2:52 PM 09/09/2022    9:12 AM 09/09/2022    9:07 AM 03/11/2022    8:59 AM 11/05/2021    9:49 AM 04/30/2021   10:51 AM 01/24/2021   10:19 AM  PHQ 2/9 Scores  PHQ - 2 Score 1 1 1  0 0 0 0  PHQ- 9 Score  2 2 1  0      Fall Risk    11/10/2022    2:50 PM 09/09/2022    9:07 AM 03/11/2022    9:00 AM 11/07/2021    9:47 PM 11/05/2021   10:03 AM  Fall Risk   Falls in the past year? 0 0 0  0  Number falls in past yr: 0 0 0  0  Injury with Fall? 0 0 0  0  Risk for fall due to : No Fall Risks No Fall Risks No Fall Risks No Fall Risks   Follow up  Falls evaluation completed;Falls prevention discussed Falls evaluation completed Falls evaluation completed;Education provided Falls evaluation completed    MEDICARE RISK AT HOME: Medicare Risk at Home Any stairs in or around the home?: No If so, are there any without handrails?: No Home free of loose throw rugs in walkways, pet beds, electrical cords, etc?: No Adequate lighting in your home to reduce risk of falls?: Yes Life alert?: No Use of a cane, walker or w/c?: No Grab bars in the bathroom?: Yes Shower chair or bench in shower?: No Elevated toilet seat or a handicapped toilet?:  No   Cognitive Function:        11/10/2022    3:20 PM 10/24/2020    4:09 PM  6CIT Screen  What Year? 0 points 0 points  What month? 0 points 0 points  What time? 0 points 0 points  Count back from 20 0 points 0 points  Months in reverse 0 points 0 points  Repeat phrase 0 points 0 points  Total Score 0 points 0 points    Immunizations  There is no immunization history on file for this patient.  TDAP status: Due, Education has been provided regarding the importance of this vaccine. Advised may receive this vaccine at local pharmacy or Health Dept. Aware to provide a copy of the vaccination record if obtained from local pharmacy or Health Dept. Verbalized acceptance and understanding.  Flu Vaccine status: Declined, Education has been provided regarding the importance of this vaccine but patient still declined. Advised may receive this vaccine at local pharmacy or Health Dept. Aware to provide a copy of the vaccination record if obtained from local pharmacy or Health Dept. Verbalized acceptance and understanding.  Pneumococcal vaccine status: Declined,  Education has been provided regarding the importance of this vaccine but patient still declined. Advised may receive this vaccine at local pharmacy or Health Dept. Aware to provide a copy of the vaccination record if obtained from local pharmacy or Health Dept. Verbalized acceptance and understanding.   Covid-19 vaccine status: Declined, Education has been provided regarding the importance of this vaccine but patient still declined. Advised may receive this vaccine at local pharmacy or Health Dept.or vaccine clinic. Aware to provide a copy of the vaccination record if obtained from local pharmacy or Health Dept. Verbalized acceptance and understanding.  Qualifies for Shingles Vaccine? No   Zostavax completed No   Shingrix Completed?: No.    Education has been provided regarding the  importance of this vaccine. Patient has been advised to  call insurance company to determine out of pocket expense if they have not yet received this vaccine. Advised may also receive vaccine at local pharmacy or Health Dept. Verbalized acceptance and understanding.  Screening Tests Health Maintenance  Topic Date Due   Medicare Annual Wellness (AWV)  11/08/2022   INFLUENZA VACCINE  05/11/2023 (Originally 09/11/2022)   Pneumonia Vaccine 56+ Years old (1 of 2 - PCV) 09/09/2023 (Originally 01/25/1957)   Hepatitis C Screening  Completed   HPV VACCINES  Aged Out   DTaP/Tdap/Td  Discontinued   Colonoscopy  Discontinued   COVID-19 Vaccine  Discontinued   Zoster Vaccines- Shingrix  Discontinued    Health Maintenance  Health Maintenance Due  Topic Date Due   Medicare Annual Wellness (AWV)  11/08/2022    Lung Cancer Screening: (Low Dose CT Chest recommended if Age 75-80 years, 20 pack-year currently smoking OR have quit w/in 15years.) does qualify.   Lung Cancer Screening Referral: DECLINED  Additional Screening:  Vision Screening: Recommended annual ophthalmology exams for early detection of glaucoma and other disorders of the eye. Is the patient up to date with their annual eye exam?  Yes   Dental Screening: Recommended annual dental exams for proper oral hygiene  Community Resource Referral / Chronic Care Management: CRR required this visit?  No   CCM required this visit?  No     Plan:    1- Patient declines all vaccines 2- Patient declines lung cancer screening as well as colon cancer screening  I have personally reviewed and noted the following in the patient's chart:   Medical and social history Use of alcohol, tobacco or illicit drugs  Current medications and supplements including opioid prescriptions.  Functional ability and status Nutritional status Physical activity Advanced directives List of other physicians Hospitalizations, surgeries, and ER visits in previous 12 months Vitals Screenings to include cognitive,  depression, and falls Referrals and appointments  In addition, I have reviewed and discussed with patient certain preventive protocols, quality metrics, and best practice recommendations. A written personalized care plan for preventive services as well as general preventive health recommendations were provided to patient.     Jacklynn Bue, LPN   05/19/8117

## 2022-11-13 ENCOUNTER — Other Ambulatory Visit: Payer: Self-pay | Admitting: Family Medicine

## 2022-11-13 DIAGNOSIS — E782 Mixed hyperlipidemia: Secondary | ICD-10-CM

## 2022-12-09 ENCOUNTER — Other Ambulatory Visit: Payer: Self-pay | Admitting: Family Medicine

## 2022-12-09 DIAGNOSIS — K219 Gastro-esophageal reflux disease without esophagitis: Secondary | ICD-10-CM

## 2022-12-29 ENCOUNTER — Other Ambulatory Visit: Payer: Self-pay | Admitting: Family Medicine

## 2022-12-29 DIAGNOSIS — G8929 Other chronic pain: Secondary | ICD-10-CM

## 2023-02-09 ENCOUNTER — Other Ambulatory Visit: Payer: Self-pay | Admitting: Family Medicine

## 2023-02-16 DIAGNOSIS — H6121 Impacted cerumen, right ear: Secondary | ICD-10-CM | POA: Diagnosis not present

## 2023-03-11 NOTE — Progress Notes (Signed)
Subjective:  Patient ID: Gregory Delgado, male    DOB: 03/07/1950  Age: 73 y.o. MRN: [id number]  Chief Complaint  Patient presents with   Medical Management of Chronic Issues    HPI  The patient, with a history of hyperlipidemia, hypertension, gastroesophageal reflux disease, shoulder arthritis, back pain, and prediabetes, presents for a routine follow-up. He reports persistent shoulder pain and intermittent back pain, which had a period of relief for about a month but recently flared up 'like crazy.' The back pain has since resolved. He is currently managing his pain with tramadol 50mg , two pills twice a day.  The patient admits to a diet high in sweets, which is concerning given his prediabetic status. He denies consuming much fried food. He also reports a smoking habit, but expresses a strong intention to quit by his next visit. He has successfully quit 'cold [place]' in the past for up to a year at a time.  The patient also mentions a broken leg, which remains slightly swollen, though not noticeably so. He has been walking less recently due to inclement weather.  Hyperlipidemia: Patient is currently taking Rosuvastatin 20 mg take 1 tablet daily.   Hypertension: Patient is taking amlodipine 10 mg daily.     GERD: Patient is currently taking Pantoprazole 40 mg take 1 tablet daily.   Left shoulder and  lumbar back pain. Taking tramadol 50 mg 2 oral twice daily.        03/12/2023    9:33 AM 11/10/2022    2:52 PM 09/09/2022    9:12 AM 09/09/2022    9:07 AM 03/11/2022    8:59 AM  Depression screen PHQ 2/9  Decreased Interest 1 1 1 1  0  Down, Depressed, Hopeless 0 0 0 0 0  PHQ - 2 Score 1 1 1 1  0  Altered sleeping 0  0 0 0  Tired, decreased energy 2  1 1 1   Change in appetite 0  0 0 0  Feeling bad or failure about yourself  0  0 0 0  Trouble concentrating 0  0 0 0  Moving slowly or fidgety/restless 0  0 0 0  Suicidal thoughts 0  0 0 0  PHQ-9 Score 3  2 2 1   Difficult doing  work/chores Not difficult at all  Not difficult at all Not difficult at all Not difficult at all        03/12/2023    9:33 AM  Fall Risk   Falls in the past year? 0  Number falls in past yr: 0  Injury with Fall? 0  Risk for fall due to : No Fall Risks  Follow up Falls evaluation completed    Patient Care Team: Blane Ohara, MD as PCP - General (Family Medicine) Maudie Flakes, MD as Referring Physician (Otolaryngology)   Review of Systems  Constitutional:  Negative for chills, diaphoresis, fatigue and fever.  HENT:  Negative for congestion, ear pain and sore throat.   Respiratory:  Negative for cough and shortness of breath.   Cardiovascular:  Negative for chest pain and leg swelling.  Gastrointestinal:  Negative for abdominal pain, constipation, diarrhea, nausea and vomiting.  Genitourinary:  Negative for dysuria and urgency.  Musculoskeletal:  Negative for arthralgias and myalgias.  Neurological:  Negative for dizziness and headaches.  Psychiatric/Behavioral:  Negative for dysphoric mood.     Current Outpatient Medications on File Prior to Visit  Medication Sig Dispense Refill   albuterol (VENTOLIN HFA) 108 (90 Base) MCG/ACT inhaler  USE 2 INHALATIONS BY MOUTH EVERY 6 HOURS AS NEEDED FOR WHEEZING  OR SHORTNESS OF BREATH 26.8 g 2   ALPRAZolam (XANAX) 1 MG tablet Take one tablet bid     amLODipine (NORVASC) 10 MG tablet TAKE 1 TABLET BY MOUTH DAILY 100 tablet 2   Ascorbic Acid (VITAMIN C) 1000 MG tablet Take 1,000 mg by mouth daily.     aspirin 81 MG EC tablet Take 162 mg by mouth daily at 10 pm.     docusate sodium (COLACE) 100 MG capsule Take 100 mg by mouth daily.     Multiple Vitamins-Minerals (CENTRUM SILVER 50+MEN) TABS Take by mouth.     pantoprazole (PROTONIX) 40 MG tablet TAKE 1 TABLET BY MOUTH DAILY 100 tablet 2   rosuvastatin (CRESTOR) 20 MG tablet TAKE 1 TABLET BY MOUTH DAILY 100 tablet 2   traMADol (ULTRAM) 50 MG tablet TAKE 2 TABLETS BY MOUTH TWICE A DAY 120 tablet 3    No current facility-administered medications on file prior to visit.   Past Medical History:  Diagnosis Date   COPD (chronic obstructive pulmonary disease) (HCC)    GERD (gastroesophageal reflux disease)    Hyperlipidemia    Hypertension    Osteoarthritis    Past Surgical History:  Procedure Laterality Date   CERVICAL DISC SURGERY  2015   C2-C4    KNEE SURGERY  1976    Family History  Problem Relation Age of Onset   Diabetes Mother    Stroke Mother    Alcoholism Father    CVA Sister    Stroke Sister    Congenital heart disease Daughter    Rectal cancer Son    Cirrhosis Son    Cancer - Colon Son    Social History   Socioeconomic History   Marital status: Single    Spouse name: Not on file   Number of children: Not on file   Years of education: Not on file   Highest education level: Not on file  Occupational History   Occupation: Retired Naval architect  Tobacco Use   Smoking status: Every Day    Types: Cigarettes   Smokeless tobacco: Never  Vaping Use   Vaping status: Never Used  Substance and Sexual Activity   Alcohol use: Never   Drug use: Never   Sexual activity: Not on file  Other Topics Concern   Not on file  Social History Narrative   Not on file   Social Drivers of Health   Financial Resource Strain: Low Risk  (09/09/2022)   Overall Financial Resource Strain (CARDIA)    Difficulty of Paying Living Expenses: Not hard at all  Food Insecurity: No Food Insecurity (09/09/2022)   Hunger Vital Sign    Worried About Running Out of Food in the Last Year: Never true    Ran Out of Food in the Last Year: Never true  Transportation Needs: No Transportation Needs (09/09/2022)   PRAPARE - Administrator, Civil Service (Medical): No    Lack of Transportation (Non-Medical): No  Physical Activity: Sufficiently Active (09/09/2022)   Exercise Vital Sign    Days of Exercise per Week: 7 days    Minutes of Exercise per Session: 60 min  Stress: No Stress  Concern Present (09/09/2022)   [employer] of Occupational Health - Occupational Stress Questionnaire    Feeling of Stress : Only a little  Social Connections: Socially Isolated (09/09/2022)   Social Connection and Isolation Panel [NHANES]    Frequency  of Communication with Friends and Family: Twice a week    Frequency of Social Gatherings with Friends and Family: Twice a week    Attends Religious Services: Never    Diplomatic Services operational officer: No    Attends Engineer, structural: Never    Marital Status: Divorced    Objective:  BP 130/70   Pulse 96   Temp 98.2 F (36.8 C)   Ht 5\' 8"  (1.727 m)   Wt 191 lb (86.6 kg)   SpO2 97%   BMI 29.04 kg/m      03/12/2023    9:31 AM 11/10/2022    2:41 PM 09/09/2022    9:05 AM  BP/Weight  Systolic BP 130 120 116  Diastolic BP 70 74 78  Wt. (Lbs) 191 190 193.6  BMI 29.04 kg/m2 28.89 kg/m2 29.44 kg/m2    Physical Exam Vitals reviewed.  Constitutional:      Appearance: Normal appearance. He is obese.  Neck:     Vascular: No carotid bruit.  Cardiovascular:     Rate and Rhythm: Normal rate and regular rhythm.     Pulses: Normal pulses.     Heart sounds: Normal heart sounds.  Pulmonary:     Effort: Pulmonary effort is normal.     Breath sounds: Normal breath sounds. No wheezing, rhonchi or rales.  Abdominal:     General: Bowel sounds are normal.     Palpations: Abdomen is soft.     Tenderness: There is no abdominal tenderness.  Neurological:     Mental Status: He is alert.  Psychiatric:        Mood and Affect: Mood normal.        Behavior: Behavior normal.     Diabetic Foot Exam - Simple   No data filed      Lab Results  Component Value Date   WBC 9.0 03/12/2023   HGB 14.6 03/12/2023   HCT 44.2 03/12/2023   PLT 280 03/12/2023   GLUCOSE 117 (H) 03/12/2023   CHOL 133 03/12/2023   TRIG 178 (H) 03/12/2023   HDL 36 (L) 03/12/2023   LDLCALC 67 03/12/2023   ALT 14 03/12/2023   AST 15 03/12/2023    NA 141 03/12/2023   K 4.0 03/12/2023   CL 102 03/12/2023   CREATININE 1.29 (H) 03/12/2023   BUN 10 03/12/2023   CO2 23 03/12/2023   TSH 1.570 11/05/2021   HGBA1C 6.0 (H) 03/12/2023      Assessment & Plan:    Mixed hyperlipidemia Assessment & Plan: Managed with Rosuvastatin. -Continue current medication regimen.  Orders: -     Lipid panel  Essential hypertension, benign Assessment & Plan: Well controlled on Amlodipine 10mg  daily, as evidenced by home blood pressure readings. -Continue current medication regimen.  Orders: -     CBC with Differential/Platelet -     Comprehensive metabolic panel  Impaired fasting glucose Assessment & Plan: Patient admits to high sweets intake. -Counselled on the importance of a healthy diet, limiting sweets and carbohydrates.  Orders: -     Hemoglobin A1c  GERD without esophagitis Assessment & Plan: Managed with Pantoprazole 40mg  daily. -Continue current medication regimen.   Mild recurrent major depression (HCC) Assessment & Plan: Management per psychiatry.    Chronic left shoulder pain Assessment & Plan: Intermittent pain, currently managed with Tramadol 50mg , two pills twice a day. -Continue current medication regimen.   Lumbar back pain Assessment & Plan: Intermittent pain, currently managed with Tramadol 50mg ,  two pills twice a day. -Continue current medication regimen.   Cigarette nicotine dependence with nicotine-induced disorder Assessment & Plan: Patient expresses intent to quit smoking. -Encouraged cessation and offered assistance if needed.       No orders of the defined types were placed in this encounter.   Orders Placed This Encounter  Procedures   CBC with Differential/Platelet   Comprehensive metabolic panel   Hemoglobin A1c   Lipid panel     Follow-up: Return in about 6 months (around 09/09/2023) for chronic follow up.   I,Marla I Leal-Borjas,acting as a scribe for Blane Ohara,  MD.,have documented all relevant documentation on the behalf of Blane Ohara, MD,as directed by  Blane Ohara, MD while in the presence of Blane Ohara, MD.   An After Visit Summary was printed and given to the patient.  I attest that I have reviewed this visit and agree with the plan scribed by my staff.   Blane Ohara, MD [facility] [phone]

## 2023-03-12 ENCOUNTER — Ambulatory Visit (INDEPENDENT_AMBULATORY_CARE_PROVIDER_SITE_OTHER): Payer: Medicare Other | Admitting: Family Medicine

## 2023-03-12 ENCOUNTER — Encounter: Payer: Self-pay | Admitting: Family Medicine

## 2023-03-12 VITALS — BP 130/70 | HR 96 | Temp 98.2°F | Ht 68.0 in | Wt 191.0 lb

## 2023-03-12 DIAGNOSIS — M545 Low back pain, unspecified: Secondary | ICD-10-CM

## 2023-03-12 DIAGNOSIS — M25512 Pain in left shoulder: Secondary | ICD-10-CM

## 2023-03-12 DIAGNOSIS — R7301 Impaired fasting glucose: Secondary | ICD-10-CM | POA: Diagnosis not present

## 2023-03-12 DIAGNOSIS — G8929 Other chronic pain: Secondary | ICD-10-CM

## 2023-03-12 DIAGNOSIS — F330 Major depressive disorder, recurrent, mild: Secondary | ICD-10-CM

## 2023-03-12 DIAGNOSIS — I10 Essential (primary) hypertension: Secondary | ICD-10-CM

## 2023-03-12 DIAGNOSIS — K219 Gastro-esophageal reflux disease without esophagitis: Secondary | ICD-10-CM | POA: Diagnosis not present

## 2023-03-12 DIAGNOSIS — F17219 Nicotine dependence, cigarettes, with unspecified nicotine-induced disorders: Secondary | ICD-10-CM | POA: Diagnosis not present

## 2023-03-12 DIAGNOSIS — E782 Mixed hyperlipidemia: Secondary | ICD-10-CM | POA: Diagnosis not present

## 2023-03-13 LAB — COMPREHENSIVE METABOLIC PANEL
ALT: 14 [IU]/L (ref 0–44)
AST: 15 [IU]/L (ref 0–40)
Albumin: 4.3 g/dL (ref 3.9–4.9)
Alkaline Phosphatase: 135 [IU]/L — ABNORMAL HIGH (ref 44–121)
BUN/Creatinine Ratio: 8 — ABNORMAL LOW (ref 10–24)
BUN: 10 mg/dL (ref 8–27)
Bilirubin Total: 0.5 mg/dL (ref 0.0–1.2)
CO2: 23 mmol/L (ref 20–29)
Calcium: 9 mg/dL (ref 8.6–10.2)
Chloride: 102 mmol/L (ref 96–106)
Creatinine, Ser: 1.29 mg/dL — ABNORMAL HIGH (ref 0.76–1.27)
Globulin, Total: 2.4 g/dL (ref 1.5–4.5)
Glucose: 117 mg/dL — ABNORMAL HIGH (ref 70–99)
Potassium: 4 mmol/L (ref 3.5–5.2)
Sodium: 141 mmol/L (ref 134–144)
Total Protein: 6.7 g/dL (ref 6.0–8.5)
eGFR: 60 mL/min/{1.73_m2} (ref >59)

## 2023-03-13 LAB — HEMOGLOBIN A1C
Est. average glucose Bld gHb Est-mCnc: 126 mg/dL
Hgb A1c MFr Bld: 6 % — ABNORMAL HIGH (ref 4.8–5.6)

## 2023-03-13 LAB — CBC WITH DIFFERENTIAL/PLATELET
Basophils Absolute: 0.1 10*3/uL (ref 0.0–0.2)
Basos: 1 %
EOS (ABSOLUTE): 0.2 10*3/uL (ref 0.0–0.4)
Eos: 3 %
Hematocrit: 44.2 % (ref 37.5–51.0)
Hemoglobin: 14.6 g/dL (ref 13.0–17.7)
Immature Grans (Abs): 0 10*3/uL (ref 0.0–0.1)
Immature Granulocytes: 0 %
Lymphocytes Absolute: 2.3 10*3/uL (ref 0.7–3.1)
Lymphs: 26 %
MCH: 30.9 pg (ref 26.6–33.0)
MCHC: 33 g/dL (ref 31.5–35.7)
MCV: 93 fL (ref 79–97)
Monocytes Absolute: 0.6 10*3/uL (ref 0.1–0.9)
Monocytes: 7 %
Neutrophils Absolute: 5.7 10*3/uL (ref 1.4–7.0)
Neutrophils: 63 %
Platelets: 280 10*3/uL (ref 150–450)
RBC: 4.73 x10E6/uL (ref 4.14–5.80)
RDW: 13.5 % (ref 11.6–15.4)
WBC: 9 10*3/uL (ref 3.4–10.8)

## 2023-03-13 LAB — LIPID PANEL
Chol/HDL Ratio: 3.7 {ratio} (ref 0.0–5.0)
Cholesterol, Total: 133 mg/dL (ref 100–199)
HDL: 36 mg/dL — ABNORMAL LOW (ref >39)
LDL Chol Calc (NIH): 67 mg/dL (ref 0–99)
Triglycerides: 178 mg/dL — ABNORMAL HIGH (ref 0–149)
VLDL Cholesterol Cal: 30 mg/dL (ref 5–40)

## 2023-03-15 NOTE — Assessment & Plan Note (Addendum)
 Management per psychiatry

## 2023-03-15 NOTE — Assessment & Plan Note (Signed)
Patient admits to high sweets intake. -Counselled on the importance of a healthy diet, limiting sweets and carbohydrates.

## 2023-03-15 NOTE — Assessment & Plan Note (Signed)
Managed with Pantoprazole 40mg  daily. -Continue current medication regimen.

## 2023-03-15 NOTE — Assessment & Plan Note (Signed)
Intermittent pain, currently managed with Tramadol 50mg , two pills twice a day. -Continue current medication regimen.

## 2023-03-15 NOTE — Assessment & Plan Note (Signed)
Well controlled on Amlodipine 10mg  daily, as evidenced by home blood pressure readings. -Continue current medication regimen.

## 2023-03-15 NOTE — Assessment & Plan Note (Signed)
Patient expresses intent to quit smoking. -Encouraged cessation and offered assistance if needed.

## 2023-03-15 NOTE — Assessment & Plan Note (Signed)
Managed with Rosuvastatin. -Continue current medication regimen.

## 2023-04-23 ENCOUNTER — Other Ambulatory Visit: Payer: Self-pay

## 2023-04-23 DIAGNOSIS — G8929 Other chronic pain: Secondary | ICD-10-CM

## 2023-06-20 ENCOUNTER — Other Ambulatory Visit: Payer: Self-pay | Admitting: Family Medicine

## 2023-06-23 NOTE — Progress Notes (Signed)
 Acute Office Visit  Subjective:    Patient ID: Gregory Delgado, male    DOB: Mar 21, 1950, 73 y.o.   MRN: [id number]  Chief Complaint  Patient presents with   Gastroesophageal Reflux    HPI: Patient is in today for gassy, nausea and sometimes vomits 2-3 times a week approx 15 minutes after he wakes up. States it is clear liquid. Tells me he had "stomach bug 6-7 weeks ago and since then has continued to feel gassy and nauseous." Stomach bug lasted about 3-4 days. Currently taking protonix . Patient is hydrating. Poor appetite.  No abdominal pain. Vomiting is not related to eating. Denies dizziness or light headedness.   Past Medical History:  Diagnosis Date   COPD (chronic obstructive pulmonary disease) (HCC)    GERD (gastroesophageal reflux disease)    Hyperlipidemia    Hypertension    Osteoarthritis     Past Surgical History:  Procedure Laterality Date   CERVICAL DISC SURGERY  2015   C2-C4    KNEE SURGERY  1976    Family History  Problem Relation Age of Onset   Diabetes Mother    Stroke Mother    Alcoholism Father    CVA Sister    Stroke Sister    Congenital heart disease Daughter    Rectal cancer Son    Cirrhosis Son    Cancer - Colon Son     Social History   Socioeconomic History   Marital status: Single    Spouse name: Not on file   Number of children: Not on file   Years of education: Not on file   Highest education level: Not on file  Occupational History   Occupation: Retired Naval architect  Tobacco Use   Smoking status: Every Day    Types: Cigarettes   Smokeless tobacco: Never  Vaping Use   Vaping status: Never Used  Substance and Sexual Activity   Alcohol use: Never   Drug use: Never   Sexual activity: Not on file  Other Topics Concern   Not on file  Social History Narrative   Not on file   Social Drivers of Health   Financial Resource Strain: Low Risk  (06/24/2023)   Overall Financial Resource Strain (CARDIA)    Difficulty of Paying Living  Expenses: Not hard at all  Food Insecurity: No Food Insecurity (06/24/2023)   Hunger Vital Sign    Worried About Running Out of Food in the Last Year: Never true    Ran Out of Food in the Last Year: Never true  Transportation Needs: No Transportation Needs (06/24/2023)   PRAPARE - Administrator, Civil Service (Medical): No    Lack of Transportation (Non-Medical): No  Physical Activity: Sufficiently Active (06/24/2023)   Exercise Vital Sign    Days of Exercise per Week: 7 days    Minutes of Exercise per Session: 60 min  Stress: No Stress Concern Present (06/24/2023)   [employer] of Occupational Health - Occupational Stress Questionnaire    Feeling of Stress : Not at all  Social Connections: Socially Isolated (06/24/2023)   Social Connection and Isolation Panel [NHANES]    Frequency of Communication with Friends and Family: Twice a week    Frequency of Social Gatherings with Friends and Family: Twice a week    Attends Religious Services: Never    Database administrator or Organizations: No    Attends Banker Meetings: Never    Marital Status: Divorced  Catering manager Violence:  Not At Risk (06/24/2023)   Humiliation, Afraid, Rape, and Kick questionnaire    Fear of Current or Ex-Partner: No    Emotionally Abused: No    Physically Abused: No    Sexually Abused: No    Outpatient Medications Prior to Visit  Medication Sig Dispense Refill   albuterol  (VENTOLIN  HFA) 108 (90 Base) MCG/ACT inhaler USE 2 INHALATIONS BY MOUTH EVERY 6 HOURS AS NEEDED FOR WHEEZING  OR SHORTNESS OF BREATH 26.8 g 2   ALPRAZolam (XANAX) 1 MG tablet Take one tablet bid     amLODipine  (NORVASC ) 10 MG tablet TAKE 1 TABLET BY MOUTH DAILY 100 tablet 0   Ascorbic Acid (VITAMIN C) 1000 MG tablet Take 1,000 mg by mouth daily.     aspirin 81 MG EC tablet Take 162 mg by mouth daily at 10 pm.     docusate sodium (COLACE) 100 MG capsule Take 100 mg by mouth daily.     Multiple Vitamins-Minerals  (CENTRUM SILVER 50+MEN) TABS Take by mouth.     rosuvastatin  (CRESTOR ) 20 MG tablet TAKE 1 TABLET BY MOUTH DAILY 100 tablet 2   traMADol  (ULTRAM ) 50 MG tablet TAKE 2 TABLETS BY MOUTH TWICE A DAY 120 tablet 2   pantoprazole  (PROTONIX ) 40 MG tablet TAKE 1 TABLET BY MOUTH DAILY 100 tablet 2   No facility-administered medications prior to visit.    Allergies  Allergen Reactions   Augmentin  [Amoxicillin -Pot Clavulanate] Itching and Nausea And Vomiting    Review of Systems  Constitutional:  Negative for chills, diaphoresis, fatigue and fever.  HENT:  Negative for sore throat.   Gastrointestinal:  Positive for nausea and vomiting. Negative for abdominal pain, constipation and diarrhea.       Gassy       Objective:         06/24/2023    9:03 AM 03/12/2023    9:31 AM 11/10/2022    2:41 PM  Vitals with BMI  Height 5\' 8"  5\' 8"  5\' 8"   Weight 188 lbs 191 lbs 190 lbs  BMI 28.59 29.05 28.9  Systolic 124 130 960  Diastolic 84 70 74  Pulse 92 96 78    No data found.   Physical Exam Vitals reviewed.  Constitutional:      Appearance: He is ill-appearing.  HENT:     Nose: No congestion or rhinorrhea.     Mouth/Throat:     Pharynx: No oropharyngeal exudate or posterior oropharyngeal erythema.  Cardiovascular:     Rate and Rhythm: Normal rate and regular rhythm.     Heart sounds: Normal heart sounds.  Pulmonary:     Effort: Pulmonary effort is normal.     Breath sounds: Normal breath sounds. No wheezing, rhonchi or rales.  Abdominal:     General: Bowel sounds are normal.     Palpations: Abdomen is soft.     Tenderness: There is no abdominal tenderness.  Skin:    Comments: pale  Neurological:     Mental Status: He is alert.  Psychiatric:        Mood and Affect: Mood normal.        Behavior: Behavior normal.     There are no preventive care reminders to display for this patient.  There are no preventive care reminders to display for this patient.   Lab Results   Component Value Date   TSH 1.570 11/05/2021   Lab Results  Component Value Date   WBC 13.8 (H) 06/24/2023   HGB 14.9 06/24/2023  HCT 41.7 06/24/2023   MCV 94 06/24/2023   PLT 289 06/24/2023   Lab Results  Component Value Date   NA 142 06/24/2023   K 4.5 06/24/2023   CO2 24 06/24/2023   GLUCOSE 129 (H) 06/24/2023   BUN 9 06/24/2023   CREATININE 1.23 06/24/2023   BILITOT 0.4 06/24/2023   ALKPHOS 144 (H) 06/24/2023   AST 19 06/24/2023   ALT 16 06/24/2023   PROT 7.0 06/24/2023   ALBUMIN 4.4 06/24/2023   CALCIUM  9.5 06/24/2023   EGFR 63 06/24/2023   Lab Results  Component Value Date   CHOL 133 03/12/2023   Lab Results  Component Value Date   HDL 36 (L) 03/12/2023   Lab Results  Component Value Date   LDLCALC 67 03/12/2023   Lab Results  Component Value Date   TRIG 178 (H) 03/12/2023   Lab Results  Component Value Date   CHOLHDL 3.7 03/12/2023   Lab Results  Component Value Date   HGBA1C 6.0 (H) 03/12/2023       Assessment & Plan:  Nausea and vomiting, unspecified vomiting type -     CBC with Differential/Platelet -     Comprehensive metabolic panel with GFR  GERD without esophagitis -     Pantoprazole  Sodium; Take 1 tablet (40 mg total) by mouth 2 (two) times daily.  Dispense: 180 tablet; Refill: 0 -     CBC with Differential/Platelet -     Comprehensive metabolic panel with GFR  Other orders -     Ondansetron  HCl; Take 1 tablet (4 mg total) by mouth every 8 (eight) hours as needed for nausea or vomiting.  Dispense: 30 tablet; Refill: 0     Meds ordered this encounter  Medications   ondansetron  (ZOFRAN ) 4 MG tablet    Sig: Take 1 tablet (4 mg total) by mouth every 8 (eight) hours as needed for nausea or vomiting.    Dispense:  30 tablet    Refill:  0   pantoprazole  (PROTONIX ) 40 MG tablet    Sig: Take 1 tablet (40 mg total) by mouth 2 (two) times daily.    Dispense:  180 tablet    Refill:  0    Please send a replace/new response with  100-Day Supply if appropriate to maximize member benefit. Requesting 1 year supply.    Orders Placed This Encounter  Procedures   CBC with Differential/Platelet   Comprehensive metabolic panel with GFR     Follow-up: Return if symptoms worsen or fail to improve.  An After Visit Summary was printed and given to the patient.  Mercy Stall, MD [facility] [phone]

## 2023-06-24 ENCOUNTER — Ambulatory Visit: Admitting: Family Medicine

## 2023-06-24 ENCOUNTER — Encounter: Payer: Self-pay | Admitting: Family Medicine

## 2023-06-24 VITALS — BP 124/84 | HR 92 | Temp 98.2°F | Ht 68.0 in | Wt 188.0 lb

## 2023-06-24 DIAGNOSIS — R112 Nausea with vomiting, unspecified: Secondary | ICD-10-CM

## 2023-06-24 DIAGNOSIS — K219 Gastro-esophageal reflux disease without esophagitis: Secondary | ICD-10-CM | POA: Diagnosis not present

## 2023-06-24 LAB — COMPREHENSIVE METABOLIC PANEL WITH GFR
ALT: 16 IU/L (ref 0–44)
AST: 19 IU/L (ref 0–40)
Albumin: 4.4 g/dL (ref 3.9–4.9)
Alkaline Phosphatase: 144 IU/L — ABNORMAL HIGH (ref 44–121)
BUN/Creatinine Ratio: 7 — ABNORMAL LOW (ref 10–24)
BUN: 9 mg/dL (ref 8–27)
Bilirubin Total: 0.4 mg/dL (ref 0.0–1.2)
CO2: 24 mmol/L (ref 20–29)
Calcium: 9.5 mg/dL (ref 8.6–10.2)
Chloride: 101 mmol/L (ref 96–106)
Creatinine, Ser: 1.23 mg/dL (ref 0.76–1.27)
Globulin, Total: 2.6 g/dL (ref 1.5–4.5)
Glucose: 129 mg/dL — ABNORMAL HIGH (ref 70–99)
Potassium: 4.5 mmol/L (ref 3.5–5.2)
Sodium: 142 mmol/L (ref 134–144)
Total Protein: 7 g/dL (ref 6.0–8.5)
eGFR: 63 mL/min/{1.73_m2} (ref >59)

## 2023-06-24 LAB — CBC WITH DIFFERENTIAL/PLATELET
Basophils Absolute: 0.1 10*3/uL (ref 0.0–0.2)
Basos: 1 %
EOS (ABSOLUTE): 0.3 10*3/uL (ref 0.0–0.4)
Eos: 2 %
Hematocrit: 41.7 % (ref 37.5–51.0)
Hemoglobin: 14.9 g/dL (ref 13.0–17.7)
Immature Grans (Abs): 0 10*3/uL (ref 0.0–0.1)
Immature Granulocytes: 0 %
Lymphocytes Absolute: 3 10*3/uL (ref 0.7–3.1)
Lymphs: 22 %
MCH: 33.6 pg — ABNORMAL HIGH (ref 26.6–33.0)
MCHC: 35.7 g/dL (ref 31.5–35.7)
MCV: 94 fL (ref 79–97)
Monocytes Absolute: 0.9 10*3/uL (ref 0.1–0.9)
Monocytes: 7 %
Neutrophils Absolute: 9.5 10*3/uL — ABNORMAL HIGH (ref 1.4–7.0)
Neutrophils: 68 %
Platelets: 289 10*3/uL (ref 150–450)
RBC: 4.44 x10E6/uL (ref 4.14–5.80)
RDW: 13.4 % (ref 11.6–15.4)
WBC: 13.8 10*3/uL — ABNORMAL HIGH (ref 3.4–10.8)

## 2023-06-24 MED ORDER — ONDANSETRON HCL 4 MG PO TABS: 4.0000 mg | ORAL_TABLET | Freq: Three times a day (TID) | ORAL | 0 refills | Status: AC | PRN

## 2023-06-24 MED ORDER — PANTOPRAZOLE SODIUM 40 MG PO TBEC: 40.0000 mg | DELAYED_RELEASE_TABLET | Freq: Two times a day (BID) | ORAL | 0 refills | Status: DC

## 2023-06-24 NOTE — Patient Instructions (Addendum)
 Recommend drink 64 oz water daily.  Increase pantoprazole  to 40 mg twice daily.  Start on ondansetron 4 mg three times a day as needed nausea/vomiting.

## 2023-06-25 ENCOUNTER — Ambulatory Visit: Payer: Self-pay | Admitting: Family Medicine

## 2023-06-27 DIAGNOSIS — R112 Nausea with vomiting, unspecified: Secondary | ICD-10-CM | POA: Insufficient documentation

## 2023-06-27 DIAGNOSIS — R110 Nausea: Secondary | ICD-10-CM | POA: Insufficient documentation

## 2023-06-28 NOTE — Assessment & Plan Note (Signed)
 Recommend drink 64 oz water daily.  Increase pantoprazole  to 40 mg twice daily.  Start on ondansetron 4 mg three times a day as needed nausea/vomiting.

## 2023-07-11 ENCOUNTER — Other Ambulatory Visit: Payer: Self-pay

## 2023-07-11 DIAGNOSIS — E782 Mixed hyperlipidemia: Secondary | ICD-10-CM

## 2023-07-14 ENCOUNTER — Telehealth: Payer: Self-pay

## 2023-07-14 NOTE — Telephone Encounter (Signed)
 Copied from CRM [id number]. Topic: Clinical - Medical Advice >> Jul 14, 2023  1:25 PM Sophia H wrote: Reason for CRM: Pt states he is still dealing with nausea and throwing up. Stated that the apartment next door had a leak and now there is mold in his closet at his apartment, wanting to know if clinic can test for mold or if mold can cause the nausea/throwing up? Please advise, ph # [phone]

## 2023-07-14 NOTE — Telephone Encounter (Signed)
 Patient was informed. He made an appointment to come tomorrow.

## 2023-07-15 ENCOUNTER — Encounter: Payer: Self-pay | Admitting: Physician Assistant

## 2023-07-15 ENCOUNTER — Ambulatory Visit: Payer: Self-pay | Admitting: Physician Assistant

## 2023-07-15 ENCOUNTER — Ambulatory Visit (INDEPENDENT_AMBULATORY_CARE_PROVIDER_SITE_OTHER): Admitting: Physician Assistant

## 2023-07-15 ENCOUNTER — Ambulatory Visit (HOSPITAL_BASED_OUTPATIENT_CLINIC_OR_DEPARTMENT_OTHER)
Admission: RE | Admit: 2023-07-15 | Discharge: 2023-07-15 | Disposition: A | Discharge status: Home / Self Care | Source: Ambulatory Visit | Attending: Physician Assistant | Admitting: Radiology

## 2023-07-15 VITALS — BP 112/78 | HR 96 | Temp 97.2°F | Ht 68.0 in | Wt 190.0 lb

## 2023-07-15 DIAGNOSIS — R112 Nausea with vomiting, unspecified: Secondary | ICD-10-CM

## 2023-07-15 DIAGNOSIS — R062 Wheezing: Secondary | ICD-10-CM | POA: Diagnosis not present

## 2023-07-15 DIAGNOSIS — K5904 Chronic idiopathic constipation: Secondary | ICD-10-CM

## 2023-07-15 DIAGNOSIS — K5909 Other constipation: Secondary | ICD-10-CM | POA: Diagnosis not present

## 2023-07-15 NOTE — Patient Instructions (Signed)
 VISIT SUMMARY:  During your visit, we discussed your ongoing issues with nausea, vomiting, and constipation, as well as your history of arthritis and rotator cuff injury. We also reviewed your general health maintenance needs.  YOUR PLAN:  -NAUSEA AND VOMITING: Your nausea and vomiting may be related to a possible upper respiratory infection due to mold exposure or a pseudo-obstruction caused by constipation. We will perform chest and abdominal x-rays to investigate further. In the meantime, please stay hydrated by drinking fluids with electrolytes once daily. If the x-rays confirm an infection, we may start you on antibiotics. If the results are inconclusive, we might refer you to a gastroenterologist for further evaluation.  -CONSTIPATION: Your constipation has worsened, possibly due to the medication you are taking for nausea. We suspect a pseudo-obstruction, which is a blockage in the intestines that isn't caused by a physical obstruction. We will perform an abdominal x-ray to check for this. In the meantime, you should use docusate sodium to help relieve your constipation.  -ARTHRITIS: Your lower back arthritis is being managed with tramadol , which helps relieve your nighttime pain.  -ROTATOR CUFF INJURY: Your fractured rotator cuff is causing nighttime pain, and you are managing this with tramadol .  -GENERAL HEALTH MAINTENANCE: You have acid reflux and have not had a colonoscopy or endoscopy in 20 years. Depending on the results of your x-rays, we may refer you to a gastroenterologist for these procedures to ensure your digestive health is properly monitored.  INSTRUCTIONS:  We will review your x-ray results and provide further instructions based on the findings. Please check MyChart for updates. If an upper respiratory infection is confirmed, we will start you on antibiotics. If the x-ray results are inconclusive, we may consider alternative treatments for your constipation or refer you to a  gastroenterologist for further evaluation.

## 2023-07-15 NOTE — Progress Notes (Unsigned)
 Acute Office Visit  Subjective:    Patient ID: Gregory Delgado, male    DOB: 22-Jul-1950, 73 y.o.   MRN: [id number]  Chief Complaint  Patient presents with   Constipation/Nausea/Vomiting    HPI: Patient is in today for continued nausea and vomiting.  Past Medical History:  Diagnosis Date   COPD (chronic obstructive pulmonary disease) (HCC)    GERD (gastroesophageal reflux disease)    Hyperlipidemia    Hypertension    Osteoarthritis     Past Surgical History:  Procedure Laterality Date   CERVICAL DISC SURGERY  2015   C2-C4    KNEE SURGERY  1976    Family History  Problem Relation Age of Onset   Diabetes Mother    Stroke Mother    Alcoholism Father    CVA Sister    Stroke Sister    Congenital heart disease Daughter    Rectal cancer Son    Cirrhosis Son    Cancer - Colon Son     Social History   Socioeconomic History   Marital status: Single    Spouse name: Not on file   Number of children: Not on file   Years of education: Not on file   Highest education level: Not on file  Occupational History   Occupation: Retired Naval architect  Tobacco Use   Smoking status: Every Day    Types: Cigarettes   Smokeless tobacco: Never  Vaping Use   Vaping status: Never Used  Substance and Sexual Activity   Alcohol use: Never   Drug use: Never   Sexual activity: Not on file  Other Topics Concern   Not on file  Social History Narrative   Not on file   Social Drivers of Health   Financial Resource Strain: Low Risk  (06/24/2023)   Overall Financial Resource Strain (CARDIA)    Difficulty of Paying Living Expenses: Not hard at all  Food Insecurity: No Food Insecurity (06/24/2023)   Hunger Vital Sign    Worried About Running Out of Food in the Last Year: Never true    Ran Out of Food in the Last Year: Never true  Transportation Needs: No Transportation Needs (06/24/2023)   PRAPARE - Administrator, Civil Service (Medical): No    Lack of Transportation  (Non-Medical): No  Physical Activity: Sufficiently Active (06/24/2023)   Exercise Vital Sign    Days of Exercise per Week: 7 days    Minutes of Exercise per Session: 60 min  Stress: No Stress Concern Present (06/24/2023)   [employer] of Occupational Health - Occupational Stress Questionnaire    Feeling of Stress : Not at all  Social Connections: Socially Isolated (06/24/2023)   Social Connection and Isolation Panel [NHANES]    Frequency of Communication with Friends and Family: Twice a week    Frequency of Social Gatherings with Friends and Family: Twice a week    Attends Religious Services: Never    Database administrator or Organizations: No    Attends Banker Meetings: Never    Marital Status: Divorced  Catering manager Violence: Not At Risk (06/24/2023)   Humiliation, Afraid, Rape, and Kick questionnaire    Fear of Current or Ex-Partner: No    Emotionally Abused: No    Physically Abused: No    Sexually Abused: No    Outpatient Medications Prior to Visit  Medication Sig Dispense Refill   albuterol  (VENTOLIN  HFA) 108 (90 Base) MCG/ACT inhaler USE 2 INHALATIONS BY MOUTH  EVERY 6 HOURS AS NEEDED FOR WHEEZING  OR SHORTNESS OF BREATH 26.8 g 2   ALPRAZolam (XANAX) 1 MG tablet Take one tablet bid     amLODipine  (NORVASC ) 10 MG tablet TAKE 1 TABLET BY MOUTH DAILY 100 tablet 0   Ascorbic Acid (VITAMIN C) 1000 MG tablet Take 1,000 mg by mouth daily.     aspirin 81 MG EC tablet Take 162 mg by mouth daily at 10 pm.     docusate sodium (COLACE) 100 MG capsule Take 100 mg by mouth daily.     Multiple Vitamins-Minerals (CENTRUM SILVER 50+MEN) TABS Take by mouth.     ondansetron  (ZOFRAN ) 4 MG tablet Take 1 tablet (4 mg total) by mouth every 8 (eight) hours as needed for nausea or vomiting. 30 tablet 0   pantoprazole  (PROTONIX ) 40 MG tablet Take 1 tablet (40 mg total) by mouth 2 (two) times daily. 180 tablet 0   rosuvastatin  (CRESTOR ) 20 MG tablet TAKE 1 TABLET BY MOUTH DAILY 100  tablet 0   traMADol  (ULTRAM ) 50 MG tablet TAKE 2 TABLETS BY MOUTH TWICE A DAY 120 tablet 2   No facility-administered medications prior to visit.    Allergies  Allergen Reactions   Augmentin  [Amoxicillin -Pot Clavulanate] Itching and Nausea And Vomiting    Review of Systems  Constitutional:  Positive for fatigue. Negative for appetite change and fever.  HENT:  Negative for congestion, ear pain, sinus pressure and sore throat.   Respiratory:  Negative for cough, chest tightness, shortness of breath and wheezing.   Cardiovascular:  Negative for chest pain and palpitations.  Gastrointestinal:  Positive for constipation, nausea and vomiting. Negative for abdominal pain and diarrhea.  Genitourinary:  Negative for dysuria and hematuria.  Musculoskeletal:  Negative for arthralgias, back pain, joint swelling and myalgias.  Skin:  Negative for rash.  Neurological:  Negative for dizziness, weakness and headaches.  Psychiatric/Behavioral:  Negative for dysphoric mood. The patient is not nervous/anxious.        Objective:         06/24/2023    9:03 AM 03/12/2023    9:31 AM 11/10/2022    2:41 PM  Vitals with BMI  Height 5\' 8"  5\' 8"  5\' 8"   Weight 188 lbs 191 lbs 190 lbs  BMI 28.59 29.05 28.9  Systolic 124 130 161  Diastolic 84 70 74  Pulse 92 96 78    No data found.   Physical Exam Vitals reviewed.  Constitutional:      Appearance: Normal appearance.  Neck:     Vascular: No carotid bruit.  Cardiovascular:     Rate and Rhythm: Normal rate and regular rhythm.     Heart sounds: Normal heart sounds.  Pulmonary:     Effort: Pulmonary effort is normal.     Breath sounds: Normal breath sounds.  Abdominal:     General: Bowel sounds are increased. There is no distension.     Palpations: Abdomen is soft.     Tenderness: There is no abdominal tenderness. There is no right CVA tenderness, left CVA tenderness, guarding or rebound.  Neurological:     Mental Status: He is alert and  oriented to person, place, and time.  Psychiatric:        Mood and Affect: Mood normal.        Behavior: Behavior normal.     There are no preventive care reminders to display for this patient.  There are no preventive care reminders to display for this patient.  Lab Results  Component Value Date   TSH 1.570 11/05/2021   Lab Results  Component Value Date   WBC 13.8 (H) 06/24/2023   HGB 14.9 06/24/2023   HCT 41.7 06/24/2023   MCV 94 06/24/2023   PLT 289 06/24/2023   Lab Results  Component Value Date   NA 142 06/24/2023   K 4.5 06/24/2023   CO2 24 06/24/2023   GLUCOSE 129 (H) 06/24/2023   BUN 9 06/24/2023   CREATININE 1.23 06/24/2023   BILITOT 0.4 06/24/2023   ALKPHOS 144 (H) 06/24/2023   AST 19 06/24/2023   ALT 16 06/24/2023   PROT 7.0 06/24/2023   ALBUMIN 4.4 06/24/2023   CALCIUM  9.5 06/24/2023   EGFR 63 06/24/2023   Lab Results  Component Value Date   CHOL 133 03/12/2023   Lab Results  Component Value Date   HDL 36 (L) 03/12/2023   Lab Results  Component Value Date   LDLCALC 67 03/12/2023   Lab Results  Component Value Date   TRIG 178 (H) 03/12/2023   Lab Results  Component Value Date   CHOLHDL 3.7 03/12/2023   Lab Results  Component Value Date   HGBA1C 6.0 (H) 03/12/2023       Assessment & Plan:  There are no diagnoses linked to this encounter.   No orders of the defined types were placed in this encounter.   No orders of the defined types were placed in this encounter.    Follow-up: No follow-ups on file.  An After Visit Summary was printed and given to the patient.   I,Lauren M Auman,acting as a Neurosurgeon for [employer], PA.,have documented all relevant documentation on the behalf of Odilia Bennett, PA,as directed by  Odilia Bennett, PA while in the presence of Odilia Bennett, [place].    Odilia Bennett, [place] [facility] [phone]

## 2023-07-16 DIAGNOSIS — K5900 Constipation, unspecified: Secondary | ICD-10-CM | POA: Insufficient documentation

## 2023-07-16 NOTE — Assessment & Plan Note (Signed)
 Nausea and vomiting reduced in frequency. Suspected upper respiratory infection due to mold exposure. Possible pseudo-obstruction due to constipation. No abdominal tenderness, suitable for outpatient management. - Order chest and abdominal x-rays to assess for upper respiratory infection and pseudo-obstruction. - Advise hydration with electrolytes once daily. - Monitor x-ray results and consider antibiotics if upper respiratory infection confirmed. - Consider gastroenterology referral if x-ray results inconclusive.

## 2023-07-16 NOTE — Assessment & Plan Note (Signed)
 Constipation with infrequent bowel movements, worsened by ondansetron . Suspected pseudo-obstruction due to decreased intake and activity. - Order abdominal x-ray to evaluate for pseudo-obstruction. - Advise use of docusate sodium.

## 2023-07-20 ENCOUNTER — Telehealth: Payer: Self-pay

## 2023-07-20 NOTE — Telephone Encounter (Signed)
 Pt returned call from missing a call from the office.

## 2023-07-20 NOTE — Telephone Encounter (Signed)
 Left message for patient to call our office regarding his questions, also suggested to utilize his mychart appt to send non-urgent messages.   Copied from CRM [id number]. Topic: General - Other >> Jul 20, 2023 11:26 AM Gregory Delgado wrote: Reason for CRM: Pt called for lab results which were provided. Pt does have some questions on his vomiting in the mornings. Please call pt at [phone].

## 2023-07-21 ENCOUNTER — Ambulatory Visit: Payer: Self-pay

## 2023-07-21 DIAGNOSIS — R110 Nausea: Secondary | ICD-10-CM | POA: Diagnosis not present

## 2023-07-21 DIAGNOSIS — R197 Diarrhea, unspecified: Secondary | ICD-10-CM | POA: Diagnosis not present

## 2023-07-21 DIAGNOSIS — I4519 Other right bundle-branch block: Secondary | ICD-10-CM | POA: Diagnosis not present

## 2023-07-21 DIAGNOSIS — R1111 Vomiting without nausea: Secondary | ICD-10-CM | POA: Diagnosis not present

## 2023-07-21 DIAGNOSIS — F17210 Nicotine dependence, cigarettes, uncomplicated: Secondary | ICD-10-CM | POA: Diagnosis not present

## 2023-07-21 DIAGNOSIS — Z743 Need for continuous supervision: Secondary | ICD-10-CM | POA: Diagnosis not present

## 2023-07-21 DIAGNOSIS — R000 Tachycardia, unspecified: Secondary | ICD-10-CM | POA: Diagnosis not present

## 2023-07-21 DIAGNOSIS — I440 Atrioventricular block, first degree: Secondary | ICD-10-CM | POA: Diagnosis not present

## 2023-07-21 DIAGNOSIS — T50995A Adverse effect of other drugs, medicaments and biological substances, initial encounter: Secondary | ICD-10-CM | POA: Diagnosis not present

## 2023-07-21 DIAGNOSIS — K521 Toxic gastroenteritis and colitis: Secondary | ICD-10-CM | POA: Diagnosis not present

## 2023-07-21 DIAGNOSIS — R5381 Other malaise: Secondary | ICD-10-CM | POA: Diagnosis not present

## 2023-07-21 DIAGNOSIS — R9431 Abnormal electrocardiogram [ECG] [EKG]: Secondary | ICD-10-CM | POA: Diagnosis not present

## 2023-07-21 NOTE — Telephone Encounter (Signed)
 FYI Only or Action Required?: FYI only for provider  Patient was last seen in primary care on 07/15/2023 by Gregory Bennett, PA. Called Nurse Triage reporting Abdominal Pain. Symptoms began several months ago. Interventions attempted: Nothing. Symptoms are: rapidly worsening.  Triage Disposition: No disposition on file.  Patient/caregiver understands and will follow disposition?:  Reason for Disposition  [1] Vomiting AND [2] contains bile (green color)  Answer Assessment - Initial Assessment Questions 1. LOCATION: "Where does it hurt?"      States sick to stomach every morning and does not eat 2. RADIATION: "Does the pain shoot anywhere else?" (e.g., chest, back)     na 3. ONSET: "When did the pain begin?" (Minutes, hours or days ago)      Last week and was told to drink mirlax and now has diarrhea 4. SUDDEN: "Gradual or sudden onset?"     suddenly 5. PATTERN "Does the pain come and go, or is it constant?"    - If it comes and goes: "How long does it last?" "Do you have pain now?"     (Note: Comes and goes means the pain is intermittent. It goes away completely between bouts.)    - If constant: "Is it getting better, staying the same, or getting worse?"      (Note: Constant means the pain never goes away completely; most serious pain is constant and gets worse.)      Comes and goes 6. SEVERITY: "How bad is the pain?"  (e.g., Scale 1-10; mild, moderate, or severe)    - MILD (1-3): Doesn't interfere with normal activities, abdomen soft and not tender to touch.     - MODERATE (4-7): Interferes with normal activities or awakens from sleep, abdomen tender to touch.     - SEVERE (8-10): Excruciating pain, doubled over, unable to do any normal activities.       Severe  7. RECURRENT SYMPTOM: "Have you ever had this type of stomach pain before?" If Yes, ask: "When was the last time?" and "What happened that time?"      Denies pain 8. CAUSE: "What do you think is causing the stomach pain?"      Denies pain 9. RELIEVING/AGGRAVATING FACTORS: "What makes it better or worse?" (e.g., antacids, bending or twisting motion, bowel movement)     no 10. OTHER SYMPTOMS: "Do you have any other symptoms?" (e.g., back pain, diarrhea, fever, urination pain, vomiting)       Diarrhea, no appetite.  Protocols used: Abdominal Pain - Male-A-AH

## 2023-07-21 NOTE — Telephone Encounter (Signed)
 Copied From CRM [id number]. Reason for Triage: pt states he is sick to his stomach every morning even when he does not eat. States this has been going on for two months and he believes it is to mold exposure. Please reach out to pt   Attempt made to contact patient by nurse: no answer:left voicemail requesting pt give us  a call back.

## 2023-07-21 NOTE — Telephone Encounter (Signed)
 Message from RN with Triage sent message stating patient is on the way to ER.

## 2023-07-23 ENCOUNTER — Other Ambulatory Visit: Payer: Self-pay | Admitting: Family Medicine

## 2023-07-23 DIAGNOSIS — G8929 Other chronic pain: Secondary | ICD-10-CM

## 2023-07-24 ENCOUNTER — Other Ambulatory Visit: Payer: Self-pay | Admitting: Family Medicine

## 2023-08-10 ENCOUNTER — Ambulatory Visit: Payer: Self-pay

## 2023-08-10 NOTE — Telephone Encounter (Signed)
 3rd attempt to reach pt: no answer: left message & will route to PCP office

## 2023-08-10 NOTE — Telephone Encounter (Signed)
 Message from [facility] sent at 08/10/2023  1:50 PM EDT  Patient called stating he is vomiting consistently every morning. Patient states this has been going on for over two months now. He stated has been seen regarding issue but he is still concerned because it's still happening. He mentioned he went a couple of days without vomiting but it started again this morning. Patient is asking could it possibly be his gallbladder. Callback number [phone]    Nurse attempted to reach pt: no answer: left voicemail

## 2023-08-10 NOTE — Telephone Encounter (Signed)
 Second attempt to return his call.   Left a voicemail to call back.   Will attempt again later.

## 2023-08-17 DIAGNOSIS — H6121 Impacted cerumen, right ear: Secondary | ICD-10-CM | POA: Diagnosis not present

## 2023-08-29 ENCOUNTER — Other Ambulatory Visit: Payer: Self-pay | Admitting: Family Medicine

## 2023-09-10 ENCOUNTER — Ambulatory Visit: Payer: Medicare Other | Admitting: Family Medicine

## 2023-09-10 ENCOUNTER — Encounter: Payer: Self-pay | Admitting: Family Medicine

## 2023-09-10 VITALS — BP 114/68 | HR 78 | Temp 97.8°F | Ht 68.0 in | Wt 183.0 lb

## 2023-09-10 DIAGNOSIS — E782 Mixed hyperlipidemia: Secondary | ICD-10-CM

## 2023-09-10 DIAGNOSIS — F17219 Nicotine dependence, cigarettes, with unspecified nicotine-induced disorders: Secondary | ICD-10-CM

## 2023-09-10 DIAGNOSIS — F330 Major depressive disorder, recurrent, mild: Secondary | ICD-10-CM

## 2023-09-10 DIAGNOSIS — R7301 Impaired fasting glucose: Secondary | ICD-10-CM | POA: Diagnosis not present

## 2023-09-10 DIAGNOSIS — I10 Essential (primary) hypertension: Secondary | ICD-10-CM

## 2023-09-10 DIAGNOSIS — J438 Other emphysema: Secondary | ICD-10-CM

## 2023-09-10 DIAGNOSIS — R110 Nausea: Secondary | ICD-10-CM | POA: Diagnosis not present

## 2023-09-10 DIAGNOSIS — K219 Gastro-esophageal reflux disease without esophagitis: Secondary | ICD-10-CM

## 2023-09-10 DIAGNOSIS — F410 Panic disorder [episodic paroxysmal anxiety] without agoraphobia: Secondary | ICD-10-CM

## 2023-09-10 MED ORDER — ESOMEPRAZOLE MAGNESIUM 40 MG PO CPDR: 40.0000 mg | DELAYED_RELEASE_CAPSULE | Freq: Two times a day (BID) | ORAL | 3 refills | Status: AC

## 2023-09-10 NOTE — Progress Notes (Signed)
 Subjective:  Patient ID: Gregory Delgado, male    DOB: 02-26-1950  Age: 73 y.o. MRN: [id number]  Chief Complaint  Patient presents with   Medical Management of Chronic Issues    HPI: Hyperlipidemia: Patient is currently taking Rosuvastatin  20 mg take 1 tablet daily.   Hypertension: Patient is taking amlodipine  10 mg daily.    Prediabetes. Last A1c was 6.   COPD: uses albuterol . No other inhalers.   Depression/Anxiety: sees Dr. Maurice, psychiatry. On xanax 1 mg twice daily.    Left shoulder and lumbar back pain: Taking tramadol  50 mg 2 oral twice daily.   Nausea: Patient has continued to have nausea. He is concerned he is having an issue with his gallbladder. He reports the nausea occurs when he awakens in the morning. Denies abdominal pain. Denies nausea. This has been going on for 2-3 months. His symptoms are not related to meals. He takes protonix  40 mg in am. Previously took nexium  that works well. Decreased appetite. He does not eat very much fatty foods.       09/10/2023   10:15 AM 06/24/2023    9:09 AM 03/12/2023    9:33 AM 11/10/2022    2:52 PM 09/09/2022    9:12 AM  Depression screen PHQ 2/9  Decreased Interest 0 1 1 1 1   Down, Depressed, Hopeless 0 0 0 0 0  PHQ - 2 Score 0 1 1 1 1   Altered sleeping 0 0 0  0  Tired, decreased energy 0 1 2  1   Change in appetite 0 0 0  0  Feeling bad or failure about yourself  0 0 0  0  Trouble concentrating 0 0 0  0  Moving slowly or fidgety/restless 0 0 0  0  Suicidal thoughts 0 0 0  0  PHQ-9 Score 0 2 3  2   Difficult doing work/chores Not difficult at all Not difficult at all Not difficult at all  Not difficult at all        06/24/2023    9:09 AM  Fall Risk   Falls in the past year? 0  Number falls in past yr: 0  Injury with Fall? 0  Risk for fall due to : No Fall Risks  Follow up Falls evaluation completed    Patient Care Team: Sherre Clapper, MD as PCP - General (Family Medicine) Honor Riis, MD as Referring Physician  (Otolaryngology)   Review of Systems  Constitutional:  Negative for chills, diaphoresis, fatigue and fever.  HENT:  Negative for congestion, ear pain and sore throat.   Respiratory:  Negative for cough and shortness of breath.   Cardiovascular:  Negative for chest pain and leg swelling.  Gastrointestinal:  Positive for nausea. Negative for abdominal pain, constipation, diarrhea and vomiting.  Genitourinary:  Negative for dysuria and urgency.  Musculoskeletal:  Positive for arthralgias. Negative for myalgias.  Neurological:  Negative for dizziness and headaches.  Psychiatric/Behavioral:  Negative for dysphoric mood.     Current Outpatient Medications on File Prior to Visit  Medication Sig Dispense Refill   albuterol  (VENTOLIN  HFA) 108 (90 Base) MCG/ACT inhaler USE 2 INHALATIONS BY MOUTH EVERY 6 HOURS AS NEEDED FOR WHEEZING  OR SHORTNESS OF BREATH 26.8 g 2   ALPRAZolam (XANAX) 1 MG tablet Take one tablet bid     amLODipine  (NORVASC ) 10 MG tablet TAKE 1 TABLET BY MOUTH DAILY 100 tablet 2   Ascorbic Acid (VITAMIN C) 1000 MG tablet Take 1,000 mg by mouth daily.  aspirin 81 MG EC tablet Take 162 mg by mouth daily at 10 pm.     docusate sodium (COLACE) 100 MG capsule Take 100 mg by mouth daily.     Multiple Vitamins-Minerals (CENTRUM SILVER 50+MEN) TABS Take by mouth.     ondansetron  (ZOFRAN ) 4 MG tablet Take 1 tablet (4 mg total) by mouth every 8 (eight) hours as needed for nausea or vomiting. 30 tablet 0   rosuvastatin  (CRESTOR ) 20 MG tablet TAKE 1 TABLET BY MOUTH DAILY 100 tablet 0   traMADol  (ULTRAM ) 50 MG tablet TAKE 2 TABLETS BY MOUTH TWICE A DAY 120 tablet 2   No current facility-administered medications on file prior to visit.   Past Medical History:  Diagnosis Date   COPD (chronic obstructive pulmonary disease) (HCC)    GERD (gastroesophageal reflux disease)    Hyperlipidemia    Hypertension    Osteoarthritis    Past Surgical History:  Procedure Laterality Date   CERVICAL  DISC SURGERY  2015   C2-C4    KNEE SURGERY  1976    Family History  Problem Relation Age of Onset   Diabetes Mother    Stroke Mother    Alcoholism Father    CVA Sister    Stroke Sister    Congenital heart disease Daughter    Rectal cancer Son    Cirrhosis Son    Cancer - Colon Son    Social History   Socioeconomic History   Marital status: Single    Spouse name: Not on file   Number of children: Not on file   Years of education: Not on file   Highest education level: Not on file  Occupational History   Occupation: Retired Naval architect  Tobacco Use   Smoking status: Every Day    Types: Cigarettes   Smokeless tobacco: Never  Vaping Use   Vaping status: Never Used  Substance and Sexual Activity   Alcohol use: Never   Drug use: Never   Sexual activity: Not on file  Other Topics Concern   Not on file  Social History Narrative   Not on file   Social Drivers of Health   Financial Resource Strain: Low Risk  (06/24/2023)   Overall Financial Resource Strain (CARDIA)    Difficulty of Paying Living Expenses: Not hard at all  Food Insecurity: No Food Insecurity (06/24/2023)   Hunger Vital Sign    Worried About Running Out of Food in the Last Year: Never true    Ran Out of Food in the Last Year: Never true  Transportation Needs: No Transportation Needs (06/24/2023)   PRAPARE - Administrator, Civil Service (Medical): No    Lack of Transportation (Non-Medical): No  Physical Activity: Sufficiently Active (06/24/2023)   Exercise Vital Sign    Days of Exercise per Week: 7 days    Minutes of Exercise per Session: 60 min  Stress: No Stress Concern Present (06/24/2023)   [employer] of Occupational Health - Occupational Stress Questionnaire    Feeling of Stress : Not at all  Social Connections: Socially Isolated (06/24/2023)   Social Connection and Isolation Panel    Frequency of Communication with Friends and Family: Twice a week    Frequency of Social  Gatherings with Friends and Family: Twice a week    Attends Religious Services: Never    Database administrator or Organizations: No    Attends Banker Meetings: Never    Marital Status: Divorced  Objective:  BP 114/68   Pulse 78   Temp 97.8 F (36.6 C)   Ht 5' 8 (1.727 m)   Wt 183 lb (83 kg)   SpO2 98%   BMI 27.83 kg/m      09/10/2023   10:06 AM 07/15/2023    2:04 PM 06/24/2023    9:03 AM  BP/Weight  Systolic BP 114 112 124  Diastolic BP 68 78 84  Wt. (Lbs) 183 190 188  BMI 27.83 kg/m2 28.89 kg/m2 28.59 kg/m2    Physical Exam Vitals reviewed.  Constitutional:      Appearance: Normal appearance.  Neck:     Vascular: No carotid bruit.  Cardiovascular:     Rate and Rhythm: Normal rate and regular rhythm.     Heart sounds: Normal heart sounds.  Pulmonary:     Effort: Pulmonary effort is normal.     Breath sounds: Normal breath sounds. No wheezing, rhonchi or rales.  Abdominal:     General: Bowel sounds are normal.     Palpations: Abdomen is soft.     Tenderness: There is no abdominal tenderness. There is no guarding or rebound. Negative signs include Murphy's sign.     Hernia: No hernia is present.  Neurological:     Mental Status: He is alert and oriented to person, place, and time.  Psychiatric:        Mood and Affect: Mood normal.        Behavior: Behavior normal.         Lab Results  Component Value Date   WBC 12.1 (H) 09/10/2023   HGB 14.7 09/10/2023   HCT 42.0 09/10/2023   PLT 298 09/10/2023   GLUCOSE 111 (H) 09/10/2023   CHOL 124 09/10/2023   TRIG 221 (H) 09/10/2023   HDL 28 (L) 09/10/2023   LDLCALC 60 09/10/2023   ALT 14 09/10/2023   AST 17 09/10/2023   NA 141 09/10/2023   K 4.5 09/10/2023   CL 101 09/10/2023   CREATININE 1.25 09/10/2023   BUN 9 09/10/2023   CO2 23 09/10/2023   TSH 1.570 11/05/2021   HGBA1C 5.8 (H) 09/10/2023      Assessment & Plan:  Essential hypertension, benign Assessment & Plan: Well  controlled.  No changes to medicines. amlodipine  10 mg daily.   Continue to work on eating a healthy diet and exercise.  Labs drawn today.    Orders: -     Comprehensive metabolic panel with GFR -     CBC with Differential/Platelet  Mixed hyperlipidemia Assessment & Plan: Managed with Rosuvastatin  20 mg before bed. . -Continue current medication regimen. Recommend continue to work on eating healthy diet and exercise.   Orders: -     Lipid panel  Impaired fasting glucose Assessment & Plan: Hemoglobin A1c 6.0, 3 month avg of blood sugars, is in prediabetic range.  In order to prevent progression to diabetes, recommend low carb diet and regular exercise   Orders: -     Hemoglobin A1c  Gastroesophageal reflux disease without esophagitis Assessment & Plan: Change protonix  to Nexium  40 mg twice daily.  Referring to GI. This is likely the cause of nausea.   Orders: -     Esomeprazole  Magnesium ; Take 1 capsule (40 mg total) by mouth 2 (two) times daily before a meal.  Dispense: 180 capsule; Refill: 3 -     Ambulatory referral to Gastroenterology  Other emphysema [facility]) Assessment & Plan: Albuterol .  Recommend quit smoking.    Cigarette  nicotine dependence with nicotine-induced disorder Assessment & Plan: Recommend cessation.    Panic attack Assessment & Plan: Continue xanax.  Management per specialist.     Mild recurrent major depression (HCC) Assessment & Plan: Management per specialist.     Nausea Assessment & Plan: Change pantoprazole  to nexium  40 mg twice daily.  Decrease caffeine.  Refer to GI.       Meds ordered this encounter  Medications   esomeprazole  (NEXIUM ) 40 MG capsule    Sig: Take 1 capsule (40 mg total) by mouth 2 (two) times daily before a meal.    Dispense:  180 capsule    Refill:  3    Orders Placed This Encounter  Procedures   Lipid panel   Hemoglobin A1c   Comprehensive metabolic panel   CBC with Differential/Platelet    Ambulatory referral to Gastroenterology     Follow-up: Return in about 3 months (around 12/11/2023) for chronic follow up.  I,Marla I Leal-Borjas,acting as a scribe for Abigail Free, MD.,have documented all relevant documentation on the behalf of Abigail Free, MD,as directed by  Abigail Free, MD while in the presence of Abigail Free, MD.    An After Visit Summary was printed and given to the patient.  I attest that I have reviewed this visit and agree with the plan scribed by my staff.  Abigail Free, MD [facility] [phone]

## 2023-09-10 NOTE — Assessment & Plan Note (Addendum)
 Change protonix  to Nexium  40 mg twice daily.  Referring to GI. This is likely the cause of nausea.

## 2023-09-10 NOTE — Assessment & Plan Note (Addendum)
Well controlled.  No changes to medicines. amlodipine 10 mg daily.  Continue to work on eating a healthy diet and exercise.  Labs drawn today.   

## 2023-09-10 NOTE — Assessment & Plan Note (Signed)
Hemoglobin A1c 6.0%, 3 month avg of blood sugars, is in prediabetic range.  In order to prevent progression to diabetes, recommend low carb diet and regular exercise 

## 2023-09-10 NOTE — Assessment & Plan Note (Addendum)
 Managed with Rosuvastatin  20 mg before bed. . -Continue current medication regimen. Recommend continue to work on eating healthy diet and exercise.

## 2023-09-11 LAB — LIPID PANEL
Chol/HDL Ratio: 4.4 ratio (ref 0.0–5.0)
Cholesterol, Total: 124 mg/dL (ref 100–199)
HDL: 28 mg/dL — ABNORMAL LOW (ref >39)
LDL Chol Calc (NIH): 60 mg/dL (ref 0–99)
Triglycerides: 221 mg/dL — ABNORMAL HIGH (ref 0–149)
VLDL Cholesterol Cal: 36 mg/dL (ref 5–40)

## 2023-09-11 LAB — COMPREHENSIVE METABOLIC PANEL WITH GFR
ALT: 14 IU/L (ref 0–44)
AST: 17 IU/L (ref 0–40)
Albumin: 4.2 g/dL (ref 3.9–4.9)
Alkaline Phosphatase: 136 IU/L — ABNORMAL HIGH (ref 44–121)
BUN/Creatinine Ratio: 7 — ABNORMAL LOW (ref 10–24)
BUN: 9 mg/dL (ref 8–27)
Bilirubin Total: 0.4 mg/dL (ref 0.0–1.2)
CO2: 23 mmol/L (ref 20–29)
Calcium: 9.3 mg/dL (ref 8.6–10.2)
Chloride: 101 mmol/L (ref 96–106)
Creatinine, Ser: 1.25 mg/dL (ref 0.76–1.27)
Globulin, Total: 2.8 g/dL (ref 1.5–4.5)
Glucose: 111 mg/dL — ABNORMAL HIGH (ref 70–99)
Potassium: 4.5 mmol/L (ref 3.5–5.2)
Sodium: 141 mmol/L (ref 134–144)
Total Protein: 7 g/dL (ref 6.0–8.5)
eGFR: 62 mL/min/1.73 (ref >59)

## 2023-09-11 LAB — CBC WITH DIFFERENTIAL/PLATELET
Basophils Absolute: 0.1 x10E3/uL (ref 0.0–0.2)
Basos: 1 %
EOS (ABSOLUTE): 0.3 x10E3/uL (ref 0.0–0.4)
Eos: 3 %
Hematocrit: 42 % (ref 37.5–51.0)
Hemoglobin: 14.7 g/dL (ref 13.0–17.7)
Immature Grans (Abs): 0 x10E3/uL (ref 0.0–0.1)
Immature Granulocytes: 0 %
Lymphocytes Absolute: 2.4 x10E3/uL (ref 0.7–3.1)
Lymphs: 19 %
MCH: 33 pg (ref 26.6–33.0)
MCHC: 35 g/dL (ref 31.5–35.7)
MCV: 94 fL (ref 79–97)
Monocytes Absolute: 0.8 x10E3/uL (ref 0.1–0.9)
Monocytes: 7 %
Neutrophils Absolute: 8.6 x10E3/uL — ABNORMAL HIGH (ref 1.4–7.0)
Neutrophils: 70 %
Platelets: 298 x10E3/uL (ref 150–450)
RBC: 4.45 x10E6/uL (ref 4.14–5.80)
RDW: 13.5 % (ref 11.6–15.4)
WBC: 12.1 x10E3/uL — ABNORMAL HIGH (ref 3.4–10.8)

## 2023-09-11 LAB — HEMOGLOBIN A1C
Est. average glucose Bld gHb Est-mCnc: 120 mg/dL
Hgb A1c MFr Bld: 5.8 % — ABNORMAL HIGH (ref 4.8–5.6)

## 2023-09-13 ENCOUNTER — Ambulatory Visit: Payer: Self-pay | Admitting: Family Medicine

## 2023-09-13 DIAGNOSIS — E782 Mixed hyperlipidemia: Secondary | ICD-10-CM

## 2023-09-13 NOTE — Assessment & Plan Note (Signed)
 Recommend cessation

## 2023-09-13 NOTE — Assessment & Plan Note (Signed)
 Albuterol .  Recommend quit smoking.

## 2023-09-13 NOTE — Assessment & Plan Note (Signed)
 Change pantoprazole  to nexium  40 mg twice daily.  Decrease caffeine.  Refer to GI.

## 2023-09-13 NOTE — Assessment & Plan Note (Signed)
 Management per specialist.

## 2023-09-13 NOTE — Assessment & Plan Note (Signed)
 Continue xanax.  Management per specialist.

## 2023-09-14 MED ORDER — ICOSAPENT ETHYL 1 G PO CAPS: 2.0000 g | ORAL_CAPSULE | Freq: Two times a day (BID) | ORAL | 0 refills | Status: DC

## 2023-09-16 ENCOUNTER — Other Ambulatory Visit: Payer: Self-pay | Admitting: Family Medicine

## 2023-09-16 MED ORDER — OMEGA-3-ACID ETHYL ESTERS 1 G PO CAPS
2.0000 g | ORAL_CAPSULE | Freq: Two times a day (BID) | ORAL | 1 refills | Status: DC
Start: 2023-09-16 — End: 2023-09-17

## 2023-09-17 ENCOUNTER — Other Ambulatory Visit: Payer: Self-pay | Admitting: Family Medicine

## 2023-09-17 MED ORDER — OMEGA-3-ACID ETHYL ESTERS 1 G PO CAPS: 2.0000 g | ORAL_CAPSULE | Freq: Two times a day (BID) | ORAL | 1 refills | Status: DC

## 2023-10-19 ENCOUNTER — Other Ambulatory Visit: Payer: Self-pay | Admitting: Family Medicine

## 2023-10-19 DIAGNOSIS — G8929 Other chronic pain: Secondary | ICD-10-CM

## 2023-10-20 ENCOUNTER — Other Ambulatory Visit: Payer: Self-pay | Admitting: Family Medicine

## 2023-10-20 DIAGNOSIS — E782 Mixed hyperlipidemia: Secondary | ICD-10-CM

## 2023-12-23 ENCOUNTER — Ambulatory Visit: Admitting: Family Medicine

## 2023-12-23 ENCOUNTER — Encounter: Payer: Self-pay | Admitting: Family Medicine

## 2023-12-23 VITALS — BP 110/88 | HR 78 | Temp 97.8°F | Ht 68.0 in | Wt 181.8 lb

## 2023-12-23 DIAGNOSIS — K5904 Chronic idiopathic constipation: Secondary | ICD-10-CM | POA: Diagnosis not present

## 2023-12-23 DIAGNOSIS — G894 Chronic pain syndrome: Secondary | ICD-10-CM | POA: Diagnosis not present

## 2023-12-23 DIAGNOSIS — K219 Gastro-esophageal reflux disease without esophagitis: Secondary | ICD-10-CM | POA: Diagnosis not present

## 2023-12-23 DIAGNOSIS — I10 Essential (primary) hypertension: Secondary | ICD-10-CM

## 2023-12-23 DIAGNOSIS — R7301 Impaired fasting glucose: Secondary | ICD-10-CM | POA: Diagnosis not present

## 2023-12-23 DIAGNOSIS — E782 Mixed hyperlipidemia: Secondary | ICD-10-CM

## 2023-12-23 LAB — POCT LIPID PANEL
HDL: 41
LDL: 81
Non-HDL: 105
TC/HDL: 2
TC: 146
TRG: 123

## 2023-12-23 LAB — POCT GLYCOSYLATED HEMOGLOBIN (HGB A1C): HbA1c POC (<> result, manual entry): 5.7 % (ref 4.0–5.6)

## 2023-12-23 MED ORDER — OMEGA-3-ACID ETHYL ESTERS 1 G PO CAPS: 2.0000 g | ORAL_CAPSULE | Freq: Two times a day (BID) | ORAL | Status: AC

## 2023-12-23 NOTE — Assessment & Plan Note (Signed)
 Managed with Miralax, effective. - Continue Miralax as needed.

## 2023-12-23 NOTE — Assessment & Plan Note (Signed)
The current medical regimen is effective;  continue present plan and medications. Continue tramadol.

## 2023-12-23 NOTE — Assessment & Plan Note (Addendum)
 Cholesterol managed with rosuvastatin  20 mg daily and Lovaza  1 gram twice daily due to insomnia with previous dosage. - Continue rosuvastatin  20 mg daily. - Continue Lovaza  1 gram twice daily.  Orders:   POCT Lipid Panel

## 2023-12-23 NOTE — Assessment & Plan Note (Addendum)
Continue nexium 40 mg twice daily

## 2023-12-23 NOTE — Progress Notes (Signed)
 Subjective:  Patient ID: Gregory Delgado, male    DOB: [birth year]  Age: 73 y.o. MRN: [id number]  Chief Complaint  Patient presents with   Medical Management of Chronic Issues    HPI: Discussed the use of AI scribe software for clinical note transcription with the patient, who gave verbal consent to proceed.  History of Present Illness Gregory Delgado is a 73 year old male who presents for follow-up regarding his medication regimen and general health concerns.  Sleep disturbance - Previously experienced significant sleep disturbances while taking omega-3/fish oil supplements at a dose of two in the morning and two at night - Sleep issues resolved after adjusting the dose to one in the morning and one at night  Mood disturbance and bereavement - Experienced feelings of depression for two to three weeks following the death of his dog from cancer in September - Period of inactivity due to bad weather and loss of his dog contributed to low mood - Mood has improved since receiving a cat from a friend  Gastrointestinal symptoms - Hospitalized in June for a viral illness with nausea - Persistent morning nausea for three to four months following the illness - Weight loss attributed to decreased oral intake - Eats before taking morning medications to avoid nausea, typically oatmeal, boiled eggs, or a biscuit - Consumes three to four meals daily, including sandwiches, fruits, and vegetables - No fevers, chills, sweats, earaches, sore throat, stuffy nose, chest pain, or shortness of breath - No bladder or bowel issues aside from the need for Miralax for bowel regularity - Upcoming gastroenterology appointment in December for further evaluation  Chronic pain - Uses tramadol  four times daily for shoulder pain  Medication regimen - Current medications: amlodipine  10 mg daily for hypertension, rosuvastatin  20 mg daily and Lovaza  (fish oil) 1 gram twice daily for high cholesterol, Nexium  40 mg  twice daily for GERD, aspirin one per day, Miralax as needed for bowel regularity, tramadol  four times daily for chronic shoulder pain       12/23/2023   10:00 AM 09/10/2023   10:15 AM 06/24/2023    9:09 AM 03/12/2023    9:33 AM 11/10/2022    2:52 PM  Depression screen PHQ 2/9  Decreased Interest 2 0 1 1 1   Down, Depressed, Hopeless 0 0 0 0 0  PHQ - 2 Score 2 0 1 1 1   Altered sleeping 2 0 0 0   Tired, decreased energy 2 0 1 2   Change in appetite 0 0 0 0   Feeling bad or failure about yourself  0 0 0 0   Trouble concentrating 0 0 0 0   Moving slowly or fidgety/restless 0 0 0 0   Suicidal thoughts 0 0 0 0   PHQ-9 Score 6 0  2  3    Difficult doing work/chores Not difficult at all Not difficult at all Not difficult at all Not difficult at all      Data saved with a previous flowsheet row definition        12/23/2023   10:00 AM  Fall Risk   Falls in the past year? 0  Number falls in past yr: 0  Injury with Fall? 0  Risk for fall due to : No Fall Risks  Follow up Falls evaluation completed    Patient Care Team: Sherre Clapper, MD as PCP - General (Family Medicine) Honor Riis, MD as Referring Physician (Otolaryngology)   Review of Systems  Constitutional:  Negative for chills, fatigue and fever.  HENT:  Negative for congestion, ear pain and sore throat.   Respiratory:  Negative for cough and shortness of breath.   Cardiovascular:  Negative for chest pain.  Gastrointestinal:  Negative for abdominal pain, constipation, diarrhea, nausea and vomiting.  Endocrine: Negative for polydipsia, polyphagia and polyuria.  Genitourinary:  Negative for dysuria and frequency.  Musculoskeletal:  Negative for arthralgias and myalgias.  Neurological:  Negative for dizziness and headaches.  Psychiatric/Behavioral:  Negative for dysphoric mood.        No dysphoria    Current Outpatient Medications on File Prior to Visit  Medication Sig Dispense Refill   albuterol  (VENTOLIN  HFA) 108 (90  Base) MCG/ACT inhaler USE 2 INHALATIONS BY MOUTH EVERY 6 HOURS AS NEEDED FOR WHEEZING  OR SHORTNESS OF BREATH 26.8 g 2   ALPRAZolam (XANAX) 1 MG tablet Take one tablet bid     amLODipine  (NORVASC ) 10 MG tablet TAKE 1 TABLET BY MOUTH DAILY 100 tablet 2   Ascorbic Acid (VITAMIN C) 1000 MG tablet Take 1,000 mg by mouth daily.     aspirin 81 MG EC tablet Take 81 mg by mouth daily at 10 pm.     docusate sodium (COLACE) 100 MG capsule Take 100 mg by mouth daily.     esomeprazole  (NEXIUM ) 40 MG capsule Take 1 capsule (40 mg total) by mouth 2 (two) times daily before a meal. 180 capsule 3   Multiple Vitamins-Minerals (CENTRUM SILVER 50+MEN) TABS Take by mouth.     ondansetron  (ZOFRAN ) 4 MG tablet Take 1 tablet (4 mg total) by mouth every 8 (eight) hours as needed for nausea or vomiting. 30 tablet 0   rosuvastatin  (CRESTOR ) 20 MG tablet TAKE 1 TABLET BY MOUTH DAILY 100 tablet 0   traMADol  (ULTRAM ) 50 MG tablet TAKE 2 TABLETS BY MOUTH TWICE A DAY 120 tablet 2   No current facility-administered medications on file prior to visit.   Past Medical History:  Diagnosis Date   COPD (chronic obstructive pulmonary disease) (HCC)    GERD (gastroesophageal reflux disease)    Hyperlipidemia    Hypertension    Osteoarthritis    Past Surgical History:  Procedure Laterality Date   CERVICAL DISC SURGERY  2015   C2-C4    KNEE SURGERY  1976    Family History  Problem Relation Age of Onset   Diabetes Mother    Stroke Mother    Alcoholism Father    CVA Sister    Stroke Sister    Congenital heart disease Daughter    Rectal cancer Son    Cirrhosis Son    Cancer - Colon Son    Social History   Socioeconomic History   Marital status: Single    Spouse name: Not on file   Number of children: Not on file   Years of education: Not on file   Highest education level: Not on file  Occupational History   Occupation: Retired Naval architect  Tobacco Use   Smoking status: Every Day    Types: Cigarettes    Smokeless tobacco: Never  Vaping Use   Vaping status: Never Used  Substance and Sexual Activity   Alcohol use: Never   Drug use: Never   Sexual activity: Not on file  Other Topics Concern   Not on file  Social History Narrative   Not on file   Social Drivers of Health   Financial Resource Strain: Low Risk  (06/24/2023)   Overall Financial Resource  Strain (CARDIA)    Difficulty of Paying Living Expenses: Not hard at all  Food Insecurity: No Food Insecurity (06/24/2023)   Hunger Vital Sign    Worried About Running Out of Food in the Last Year: Never true    Ran Out of Food in the Last Year: Never true  Transportation Needs: No Transportation Needs (06/24/2023)   PRAPARE - Administrator, Civil Service (Medical): No    Lack of Transportation (Non-Medical): No  Physical Activity: Sufficiently Active (06/24/2023)   Exercise Vital Sign    Days of Exercise per Week: 7 days    Minutes of Exercise per Session: 60 min  Stress: No Stress Concern Present (06/24/2023)   [employer] of Occupational Health - Occupational Stress Questionnaire    Feeling of Stress : Not at all  Social Connections: Socially Isolated (06/24/2023)   Social Connection and Isolation Panel    Frequency of Communication with Friends and Family: Twice a week    Frequency of Social Gatherings with Friends and Family: Twice a week    Attends Religious Services: Never    Database Administrator or Organizations: No    Attends Engineer, Structural: Never    Marital Status: Divorced    Objective:  BP 110/88 (BP Location: Left Arm, Patient Position: Sitting)   Pulse 78   Temp 97.8 F (36.6 C) (Temporal)   Ht 5' 8 (1.727 m)   Wt 181 lb 12.8 oz (82.5 kg)   SpO2 98%   BMI 27.64 kg/m      12/23/2023    9:56 AM 09/10/2023   10:06 AM 07/15/2023    2:04 PM  BP/Weight  Systolic BP 110 114 112  Diastolic BP 88 68 78  Wt. (Lbs) 181.8 183 190  BMI 27.64 kg/m2 27.83 kg/m2 28.89 kg/m2     Physical Exam Vitals reviewed.  Constitutional:      Appearance: Normal appearance.  Neck:     Vascular: No carotid bruit.  Cardiovascular:     Rate and Rhythm: Normal rate and regular rhythm.     Heart sounds: Normal heart sounds.  Pulmonary:     Effort: Pulmonary effort is normal.     Breath sounds: Normal breath sounds. No wheezing, rhonchi or rales.  Abdominal:     General: Bowel sounds are normal.     Palpations: Abdomen is soft.     Tenderness: There is no abdominal tenderness.  Neurological:     Mental Status: He is alert and oriented to person, place, and time.  Psychiatric:        Mood and Affect: Mood normal.        Behavior: Behavior normal.         Lab Results  Component Value Date   WBC 12.1 (H) 09/10/2023   HGB 14.7 09/10/2023   HCT 42.0 09/10/2023   PLT 298 09/10/2023   GLUCOSE 111 (H) 09/10/2023   CHOL 124 09/10/2023   TRIG 221 (H) 09/10/2023   HDL 28 (L) 09/10/2023   LDLCALC 60 09/10/2023   ALT 14 09/10/2023   AST 17 09/10/2023   NA 141 09/10/2023   K 4.5 09/10/2023   CL 101 09/10/2023   CREATININE 1.25 09/10/2023   BUN 9 09/10/2023   CO2 23 09/10/2023   TSH 1.570 11/05/2021   HGBA1C 5.8 (H) 09/10/2023    Results for orders placed or performed in visit on 09/10/23  Lipid panel   Collection Time: 09/10/23 10:54 AM  Result  Value Ref Range   Cholesterol, Total 124 100 - 199 mg/dL   Triglycerides 778 (H) 0 - 149 mg/dL   HDL 28 (L) >60 mg/dL   VLDL Cholesterol Cal 36 5 - 40 mg/dL   LDL Chol Calc (NIH) 60 0 - 99 mg/dL   Chol/HDL Ratio 4.4 0.0 - 5.0 ratio  Hemoglobin A1c   Collection Time: 09/10/23 10:54 AM  Result Value Ref Range   Hgb A1c MFr Bld 5.8 (H) 4.8 - 5.6 %   Est. average glucose Bld gHb Est-mCnc 120 mg/dL  Comprehensive metabolic panel   Collection Time: 09/10/23 10:54 AM  Result Value Ref Range   Glucose 111 (H) 70 - 99 mg/dL   BUN 9 8 - 27 mg/dL   Creatinine, Ser 8.74 0.76 - 1.27 mg/dL   eGFR 62 >40 fO/fpw/8.26    BUN/Creatinine Ratio 7 (L) 10 - 24   Sodium 141 134 - 144 mmol/L   Potassium 4.5 3.5 - 5.2 mmol/L   Chloride 101 96 - 106 mmol/L   CO2 23 20 - 29 mmol/L   Calcium  9.3 8.6 - 10.2 mg/dL   Total Protein 7.0 6.0 - 8.5 g/dL   Albumin 4.2 3.9 - 4.9 g/dL   Globulin, Total 2.8 1.5 - 4.5 g/dL   Bilirubin Total 0.4 0.0 - 1.2 mg/dL   Alkaline Phosphatase 136 (H) 44 - 121 IU/L   AST 17 0 - 40 IU/L   ALT 14 0 - 44 IU/L  CBC with Differential/Platelet   Collection Time: 09/10/23 10:54 AM  Result Value Ref Range   WBC 12.1 (H) 3.4 - 10.8 x10E3/uL   RBC 4.45 4.14 - 5.80 x10E6/uL   Hemoglobin 14.7 13.0 - 17.7 g/dL   Hematocrit 57.9 62.4 - 51.0 %   MCV 94 79 - 97 fL   MCH 33.0 26.6 - 33.0 pg   MCHC 35.0 31.5 - 35.7 g/dL   RDW 86.4 88.3 - 84.5 %   Platelets 298 150 - 450 x10E3/uL   Neutrophils 70 Not Estab. %   Lymphs 19 Not Estab. %   Monocytes 7 Not Estab. %   Eos 3 Not Estab. %   Basos 1 Not Estab. %   Neutrophils Absolute 8.6 (H) 1.4 - 7.0 x10E3/uL   Lymphocytes Absolute 2.4 0.7 - 3.1 x10E3/uL   Monocytes Absolute 0.8 0.1 - 0.9 x10E3/uL   EOS (ABSOLUTE) 0.3 0.0 - 0.4 x10E3/uL   Basophils Absolute 0.1 0.0 - 0.2 x10E3/uL   Immature Granulocytes 0 Not Estab. %   Immature Grans (Abs) 0.0 0.0 - 0.1 x10E3/uL  .  Assessment & Plan:   Assessment & Plan Essential hypertension, benign Blood pressure managed with amlodipine  10 mg daily. - Continue amlodipine  10 mg daily. Orders:   CBC with Differential/Platelet   Comprehensive metabolic panel with GFR  Mixed hyperlipidemia Cholesterol managed with rosuvastatin  20 mg daily and Lovaza  1 gram twice daily due to insomnia with previous dosage. - Continue rosuvastatin  20 mg daily. - Continue Lovaza  1 gram twice daily.  Orders:   POCT Lipid Panel  Gastroesophageal reflux disease without esophagitis Continue nexium  40 mg twice daily.     Impaired fasting glucose Improved! Orders:   POCT glycosylated hemoglobin (Hb A1C)  Chronic  idiopathic constipation Managed with Miralax, effective. - Continue Miralax as needed.    Pain syndrome, chronic The current medical regimen is effective;  continue present plan and medications. Continue tramadol .       Body mass index is 27.64 kg/m.  Meds ordered this encounter  Medications   omega-3 acid ethyl esters (LOVAZA ) 1 g capsule    Sig: Take 2 capsules (2 g total) by mouth 2 (two) times daily.    Cancel vascepa  prescription.    Orders Placed This Encounter  Procedures   CBC with Differential/Platelet   Comprehensive metabolic panel with GFR   POCT glycosylated hemoglobin (Hb A1C)   POCT Lipid Panel       Follow-up: Return for awv due before the end of the year. 4 month follow up fasting please. SABRA  An After Visit Summary was printed and given to the patient.  Abigail Free, MD [facility] [phone]

## 2023-12-23 NOTE — Assessment & Plan Note (Addendum)
 Improved! Orders:   POCT glycosylated hemoglobin (Hb A1C)

## 2023-12-23 NOTE — Assessment & Plan Note (Addendum)
 Blood pressure managed with amlodipine  10 mg daily. - Continue amlodipine  10 mg daily. Orders:   CBC with Differential/Platelet   Comprehensive metabolic panel with GFR

## 2023-12-24 ENCOUNTER — Ambulatory Visit: Payer: Self-pay | Admitting: Family Medicine

## 2023-12-24 LAB — CBC WITH DIFFERENTIAL/PLATELET
Basophils Absolute: 0.1 x10E3/uL (ref 0.0–0.2)
Basos: 1 %
EOS (ABSOLUTE): 0.2 x10E3/uL (ref 0.0–0.4)
Eos: 2 %
Hematocrit: 45.1 % (ref 37.5–51.0)
Hemoglobin: 14.8 g/dL (ref 13.0–17.7)
Immature Grans (Abs): 0 x10E3/uL (ref 0.0–0.1)
Immature Granulocytes: 0 %
Lymphocytes Absolute: 3.1 x10E3/uL (ref 0.7–3.1)
Lymphs: 28 %
MCH: 30.6 pg (ref 26.6–33.0)
MCHC: 32.8 g/dL (ref 31.5–35.7)
MCV: 93 fL (ref 79–97)
Monocytes Absolute: 0.7 x10E3/uL (ref 0.1–0.9)
Monocytes: 6 %
Neutrophils Absolute: 6.8 x10E3/uL (ref 1.4–7.0)
Neutrophils: 63 %
Platelets: 276 x10E3/uL (ref 150–450)
RBC: 4.84 x10E6/uL (ref 4.14–5.80)
RDW: 13.8 % (ref 11.6–15.4)
WBC: 10.8 x10E3/uL (ref 3.4–10.8)

## 2023-12-24 LAB — COMPREHENSIVE METABOLIC PANEL WITH GFR
ALT: 25 IU/L (ref 0–44)
AST: 19 IU/L (ref 0–40)
Albumin: 4.5 g/dL (ref 3.9–4.9)
Alkaline Phosphatase: 128 IU/L — ABNORMAL HIGH (ref 47–123)
BUN/Creatinine Ratio: 11 (ref 10–24)
BUN: 13 mg/dL (ref 8–27)
Bilirubin Total: 0.3 mg/dL (ref 0.0–1.2)
CO2: 26 mmol/L (ref 20–29)
Calcium: 9.6 mg/dL (ref 8.6–10.2)
Chloride: 101 mmol/L (ref 96–106)
Creatinine, Ser: 1.21 mg/dL (ref 0.76–1.27)
Globulin, Total: 2.6 g/dL (ref 1.5–4.5)
Glucose: 108 mg/dL — ABNORMAL HIGH (ref 70–99)
Potassium: 4.1 mmol/L (ref 3.5–5.2)
Sodium: 142 mmol/L (ref 134–144)
Total Protein: 7.1 g/dL (ref 6.0–8.5)
eGFR: 64 mL/min/1.73 (ref >59)

## 2024-01-14 ENCOUNTER — Ambulatory Visit

## 2024-01-14 VITALS — BP 131/79 | Ht 68.0 in | Wt 181.0 lb

## 2024-01-14 DIAGNOSIS — Z0000 Encounter for general adult medical examination without abnormal findings: Secondary | ICD-10-CM

## 2024-01-14 NOTE — Progress Notes (Signed)
 I connected with  Gregory Delgado on 01/14/24 by a audio enabled telemedicine application and verified that I am speaking with the correct person using two identifiers.  Patient Location: Home  Provider Location: Home Office  Persons Participating in Visit: Patient.  I discussed the limitations of evaluation and management by telemedicine. The patient expressed understanding and agreed to proceed.  Vital Signs: Because this visit was a virtual/telehealth visit, some criteria may be missing or patient reported. Any vitals not documented were not able to be obtained and vitals that have been documented are patient reported.   Because this visit was a virtual/telehealth visit,  certain criteria was not obtained, such a blood pressure, CBG if applicable, and timed get up and go. Any medications not marked as taking were not mentioned during the medication reconciliation part of the visit. Any vitals not documented were not able to be obtained due to this being a telehealth visit or patient was unable to self-report a recent blood pressure reading due to a lack of equipment at home via telehealth. Vitals that have been documented are verbally provided by the patient.   This visit was performed by a medical professional under my direct supervision. I was immediately available for consultation/collaboration. I have reviewed and agree with the Annual Wellness Visit documentation.  Chief Complaint  Patient presents with   Medicare Wellness     Subjective:   Gregory Delgado is a 73 y.o. male who presents for a Medicare Annual Wellness Visit.  Visit info / Clinical Intake: Medicare Wellness Visit Type:: Subsequent Annual Wellness Visit Persons participating in visit and providing information:: patient Medicare Wellness Visit Mode:: Telephone If telephone:: video declined Since this visit was completed virtually, some vitals may be partially provided or unavailable. Missing vitals are due to the  limitations of the virtual format.: Documented vitals are patient reported If Telephone or Video please confirm:: I connected with patient using audio/video enable telemedicine. I verified patient identity with two identifiers, discussed telehealth limitations, and patient agreed to proceed. Patient Location:: home Provider Location:: home office Interpreter Needed?: No Pre-visit prep was completed: yes AWV questionnaire completed by patient prior to visit?: no Living arrangements:: (!) lives alone Patient's Overall Health Status Rating: very good Typical amount of pain: none Does pain affect daily life?: no Are you currently prescribed opioids?: no  Dietary Habits and Nutritional Risks How many meals a day?: 2 Eats fruit and vegetables daily?: yes Most meals are obtained by: preparing own meals; eating out In the last 2 weeks, have you had any of the following?: none Diabetic:: no  Functional Status Activities of Daily Living (to include ambulation/medication): Independent Ambulation: Independent Medication Administration: Independent Home Management (perform basic housework or laundry): Independent Manage your own finances?: yes Primary transportation is: driving Concerns about vision?: no *vision screening is required for WTM* Concerns about hearing?: no  Fall Screening Falls in the past year?: 0 Number of falls in past year: 0 Was there an injury with Fall?: 0 Fall Risk Category Calculator: 0 Patient Fall Risk Level: Low Fall Risk  Fall Risk Patient at Risk for Falls Due to: No Fall Risks Fall risk Follow up: Falls evaluation completed; Falls prevention discussed  Home and Transportation Safety: All rugs have non-skid backing?: N/A, no rugs All stairs or steps have railings?: N/A, no stairs Grab bars in the bathtub or shower?: yes Have non-skid surface in bathtub or shower?: yes Good home lighting?: yes Regular seat belt use?: yes Hospital stays in the last  year::  no  Cognitive Assessment Difficulty concentrating, remembering, or making decisions? : no Will 6CIT or Mini Cog be Completed: yes What year is it?: 0 points What month is it?: 0 points Give patient an address phrase to remember (5 components): [address] [place] About what time is it?: 0 points Count backwards from 20 to 1: 0 points Say the months of the year in reverse: 0 points Repeat the address phrase from earlier: 0 points 6 CIT Score: 0 points  Advance Directives (For Healthcare) Does Patient Have a Medical Advance Directive?: No Would patient like information on creating a medical advance directive?: No - Patient declined  Reviewed/Updated  Reviewed/Updated: Reviewed All (Medical, Surgical, Family, Medications, Allergies, Care Teams, Patient Goals)    Allergies (verified) Augmentin  [amoxicillin -pot clavulanate]   Current Medications (verified) Outpatient Encounter Medications as of 01/14/2024  Medication Sig   albuterol  (VENTOLIN  HFA) 108 (90 Base) MCG/ACT inhaler USE 2 INHALATIONS BY MOUTH EVERY 6 HOURS AS NEEDED FOR WHEEZING  OR SHORTNESS OF BREATH   ALPRAZolam (XANAX) 1 MG tablet Take one tablet bid   amLODipine  (NORVASC ) 10 MG tablet TAKE 1 TABLET BY MOUTH DAILY   Ascorbic Acid (VITAMIN C) 1000 MG tablet Take 1,000 mg by mouth daily.   aspirin 81 MG EC tablet Take 81 mg by mouth daily at 10 pm.   docusate sodium (COLACE) 100 MG capsule Take 100 mg by mouth daily.   esomeprazole  (NEXIUM ) 40 MG capsule Take 1 capsule (40 mg total) by mouth 2 (two) times daily before a meal.   Multiple Vitamins-Minerals (CENTRUM SILVER 50+MEN) TABS Take by mouth.   omega-3 acid ethyl esters (LOVAZA ) 1 g capsule Take 2 capsules (2 g total) by mouth 2 (two) times daily.   ondansetron  (ZOFRAN ) 4 MG tablet Take 1 tablet (4 mg total) by mouth every 8 (eight) hours as needed for nausea or vomiting.   rosuvastatin  (CRESTOR ) 20 MG tablet TAKE 1 TABLET BY MOUTH DAILY   traMADol  (ULTRAM )  50 MG tablet TAKE 2 TABLETS BY MOUTH TWICE A DAY   No facility-administered encounter medications on file as of 01/14/2024.    History: Past Medical History:  Diagnosis Date   COPD (chronic obstructive pulmonary disease) (HCC)    GERD (gastroesophageal reflux disease)    Hyperlipidemia    Hypertension    Osteoarthritis    Past Surgical History:  Procedure Laterality Date   CERVICAL DISC SURGERY  2015   C2-C4    KNEE SURGERY  1976   Family History  Problem Relation Age of Onset   Diabetes Mother    Stroke Mother    Alcoholism Father    CVA Sister    Stroke Sister    Congenital heart disease Daughter    Rectal cancer Son    Cirrhosis Son    Cancer - Colon Son    Social History   Occupational History   Occupation: Retired Naval architect  Tobacco Use   Smoking status: Every Day    Types: Cigarettes   Smokeless tobacco: Never  Vaping Use   Vaping status: Never Used  Substance and Sexual Activity   Alcohol use: Never   Drug use: Never   Sexual activity: Not on file   Tobacco Counseling Ready to quit: Not Answered Counseling given: Not Answered  SDOH Screenings   Food Insecurity: No Food Insecurity (01/14/2024)  Housing: Low Risk  (01/14/2024)  Transportation Needs: No Transportation Needs (01/14/2024)  Utilities: Not At Risk (01/14/2024)  Alcohol Screen:  Low Risk  (06/24/2023)  Depression (PHQ2-9): Low Risk  (01/14/2024)  Recent Concern: Depression (PHQ2-9) - Medium Risk (12/23/2023)  Financial Resource Strain: Low Risk  (06/24/2023)  Physical Activity: Sufficiently Active (01/14/2024)  Social Connections: Socially Isolated (01/14/2024)  Stress: No Stress Concern Present (01/14/2024)  Tobacco Use: High Risk (01/14/2024)  Health Literacy: Adequate Health Literacy (01/14/2024)   See flowsheets for full screening details  Depression Screen PHQ 2 & 9 Depression Scale- Over the past 2 weeks, how often have you been bothered by any of the following problems? Little  interest or pleasure in doing things: 0 Feeling down, depressed, or hopeless (PHQ Adolescent also includes...irritable): 0 PHQ-2 Total Score: 0 Trouble falling or staying asleep, or sleeping too much: 0 Feeling tired or having little energy: 0 Poor appetite or overeating (PHQ Adolescent also includes...weight loss): 0 Feeling bad about yourself - or that you are a failure or have let yourself or your family down: 0 Trouble concentrating on things, such as reading the newspaper or watching television (PHQ Adolescent also includes...like school work): 0 Moving or speaking so slowly that other people could have noticed. Or the opposite - being so fidgety or restless that you have been moving around a lot more than usual: 0 Thoughts that you would be better off dead, or of hurting yourself in some way: 0 PHQ-9 Total Score: 0 If you checked off any problems, how difficult have these problems made it for you to do your work, take care of things at home, or get along with other people?: Not difficult at all  Depression Treatment Depression Interventions/Treatment : [id number] Score <4 Follow-up Not Indicated     Goals Addressed             This Visit's Progress    Patient Stated       To get out the house more              Objective:    Today's Vitals   01/14/24 1054  BP: 131/79  Weight: 181 lb (82.1 kg)  Height: 5' 8 (1.727 m)   Body mass index is 27.52 kg/m.  Hearing/Vision screen Hearing Screening - Comments:: No difficulties Vision Screening - Comments:: Patient wears glasses , next appointment may 2026 , patient sees an eye doctor in [place] Immunizations and Health Maintenance Health Maintenance  Topic Date Due   Medicare Annual Wellness (AWV)  11/10/2023   Influenza Vaccine  05/10/2024 (Originally 09/11/2023)   Pneumococcal Vaccine: 50+ Years (1 of 2 - PCV) 09/09/2024 (Originally 01/25/1970)   Hepatitis C Screening  Completed   Meningococcal B Vaccine  Aged Out    DTaP/Tdap/Td  Discontinued   Colonoscopy  Discontinued   COVID-19 Vaccine  Discontinued   Zoster Vaccines- Shingrix  Discontinued        Assessment/Plan:  This is a routine wellness examination for Gregory Delgado.  Patient Care Team: Sherre Clapper, MD as PCP - General (Family Medicine) Honor Riis, MD as Referring Physician (Otolaryngology) Silva Elveria BIRCH (Inactive) as Referring Physician Misenheimer, Evalene, MD as Consulting Physician (Unknown Physician Specialty) [facility], O.D., PA  I have personally reviewed and noted the following in the patient's chart:   Medical and social history Use of alcohol, tobacco or illicit drugs  Current medications and supplements including opioid prescriptions. Functional ability and status Nutritional status Physical activity Advanced directives List of other physicians Hospitalizations, surgeries, and ER visits in previous 12 months Vitals Screenings to include cognitive, depression, and falls Referrals and appointments  No orders of the defined types were placed in this encounter.  In addition, I have reviewed and discussed with patient certain preventive protocols, quality metrics, and best practice recommendations. A written personalized care plan for preventive services as well as general preventive health recommendations were provided to patient.   Gregory Delgado, [place]   01/14/2024   No follow-ups on file.  After Visit Summary: (MyChart) Due to this being a telephonic visit, the after visit summary with patients personalized plan was offered to patient via MyChart   Nurse Notes: Patient has now questions or Concerns at this time. Patient has an appointment in office 04/22/2024

## 2024-01-14 NOTE — Patient Instructions (Signed)
 Mr. Senn,  Thank you for taking the time for your Medicare Wellness Visit. I appreciate your continued commitment to your health goals. Please review the care plan we discussed, and feel free to reach out if I can assist you further.  Please note that Annual Wellness Visits do not include a physical exam. Some assessments may be limited, especially if the visit was conducted virtually. If needed, we may recommend an in-person follow-up with your provider.  Ongoing Care Seeing your primary care provider every 3 to 6 months helps us  monitor your health and provide consistent, personalized care.   Referrals If a referral was made during today's visit and you haven't received any updates within two weeks, please contact the referred provider directly to check on the status.  Recommended Screenings:  Health Maintenance  Topic Date Due   Medicare Annual Wellness Visit  11/10/2023   Flu Shot  05/10/2024*   Pneumococcal Vaccine for age over 5 (1 of 2 - PCV) 09/09/2024*   Hepatitis C Screening  Completed   Meningitis B Vaccine  Aged Out   DTaP/Tdap/Td vaccine  Discontinued   Colon Cancer Screening  Discontinued   COVID-19 Vaccine  Discontinued   Zoster (Shingles) Vaccine  Discontinued  *Topic was postponed. The date shown is not the original due date.       11/07/2021    9:46 PM  Advanced Directives  Does Patient Have a Medical Advance Directive? No  Would patient like information on creating a medical advance directive? Yes (MAU/Ambulatory/Procedural Areas - Information given)    Vision: Annual vision screenings are recommended for early detection of glaucoma, cataracts, and diabetic retinopathy. These exams can also reveal signs of chronic conditions such as diabetes and high blood pressure.  Dental: Annual dental screenings help detect early signs of oral cancer, gum disease, and other conditions linked to overall health, including heart disease and diabetes.  Please see the  attached documents for additional preventive care recommendations.

## 2024-01-18 ENCOUNTER — Other Ambulatory Visit: Payer: Self-pay | Admitting: Family Medicine

## 2024-01-18 DIAGNOSIS — G8929 Other chronic pain: Secondary | ICD-10-CM

## 2024-01-23 ENCOUNTER — Other Ambulatory Visit: Payer: Self-pay | Admitting: Family Medicine

## 2024-01-23 DIAGNOSIS — E782 Mixed hyperlipidemia: Secondary | ICD-10-CM

## 2024-04-22 ENCOUNTER — Ambulatory Visit: Admitting: Family Medicine

## 2024-04-27 ENCOUNTER — Ambulatory Visit: Admitting: Family Medicine
